# Patient Record
Sex: Male | Born: 1960 | Race: White | Hispanic: No | Smoking: Current every day smoker
Health system: Southern US, Community
[De-identification: ages and names within clinical notes are randomized; demographics above are authoritative.]

## PROBLEM LIST (undated history)

## (undated) DIAGNOSIS — I6529 Occlusion and stenosis of unspecified carotid artery: Secondary | ICD-10-CM

## (undated) DIAGNOSIS — T7840XA Allergy, unspecified, initial encounter: Secondary | ICD-10-CM

## (undated) DIAGNOSIS — R002 Palpitations: Secondary | ICD-10-CM

## (undated) DIAGNOSIS — Z9889 Other specified postprocedural states: Secondary | ICD-10-CM

## (undated) DIAGNOSIS — J3089 Other allergic rhinitis: Secondary | ICD-10-CM

## (undated) DIAGNOSIS — K219 Gastro-esophageal reflux disease without esophagitis: Secondary | ICD-10-CM

## (undated) DIAGNOSIS — I10 Essential (primary) hypertension: Secondary | ICD-10-CM

## (undated) DIAGNOSIS — G4730 Sleep apnea, unspecified: Secondary | ICD-10-CM

## (undated) DIAGNOSIS — M792 Neuralgia and neuritis, unspecified: Secondary | ICD-10-CM

## (undated) DIAGNOSIS — E785 Hyperlipidemia, unspecified: Secondary | ICD-10-CM

## (undated) HISTORY — PX: ROTATOR CUFF REPAIR: SHX139

## (undated) HISTORY — DX: Essential (primary) hypertension: I10

## (undated) HISTORY — DX: Allergy, unspecified, initial encounter: T78.40XA

## (undated) HISTORY — DX: Occlusion and stenosis of unspecified carotid artery: I65.29

## (undated) HISTORY — DX: Hyperlipidemia, unspecified: E78.5

## (undated) HISTORY — PX: OTHER SURGICAL HISTORY: SHX169

---

## 1998-08-06 ENCOUNTER — Ambulatory Visit (HOSPITAL_COMMUNITY): Admission: RE | Admit: 1998-08-06 | Discharge: 1998-08-06 | Payer: Self-pay | Admitting: Neurosurgery

## 1998-08-06 ENCOUNTER — Encounter: Payer: Self-pay | Admitting: Neurosurgery

## 2004-11-19 ENCOUNTER — Ambulatory Visit: Payer: Self-pay | Admitting: Internal Medicine

## 2005-10-28 ENCOUNTER — Ambulatory Visit: Payer: Self-pay | Admitting: Internal Medicine

## 2009-01-30 ENCOUNTER — Ambulatory Visit: Payer: Self-pay | Admitting: Internal Medicine

## 2009-01-30 DIAGNOSIS — R82998 Other abnormal findings in urine: Secondary | ICD-10-CM | POA: Insufficient documentation

## 2009-01-30 DIAGNOSIS — M549 Dorsalgia, unspecified: Secondary | ICD-10-CM | POA: Insufficient documentation

## 2009-01-30 LAB — CONVERTED CEMR LAB
Bilirubin Urine: NEGATIVE
Blood in Urine, dipstick: NEGATIVE
Glucose, Urine, Semiquant: NEGATIVE
Ketones, urine, test strip: NEGATIVE
Nitrite: NEGATIVE
Protein, U semiquant: NEGATIVE
Specific Gravity, Urine: 1.015
Urobilinogen, UA: 0.2
WBC Urine, dipstick: NEGATIVE
pH: 6

## 2009-01-31 ENCOUNTER — Encounter: Payer: Self-pay | Admitting: Internal Medicine

## 2009-02-02 ENCOUNTER — Encounter (INDEPENDENT_AMBULATORY_CARE_PROVIDER_SITE_OTHER): Payer: Self-pay | Admitting: *Deleted

## 2009-02-03 ENCOUNTER — Telehealth (INDEPENDENT_AMBULATORY_CARE_PROVIDER_SITE_OTHER): Payer: Self-pay | Admitting: *Deleted

## 2010-12-23 NOTE — Progress Notes (Signed)
Summary: Lab Results  Phone Note Outgoing Call Call back at Home Phone [phone]   Call placed by: Shonna Chock,  February 03, 2009 8:53 AM Call placed to: Patient Summary of Call: Spoke with patient Re: Urine Culture Excellent no UTI present. Because of symptom , please stay well hydrated, drinking up to 40 oz of H2O daily.  Tommy May  February 03, 2009 8:55 AM

## 2010-12-23 NOTE — Assessment & Plan Note (Signed)
Summary: acute only for urine and back pain--ph   Vital Signs:  Patient profile:   50 year old male Weight:      191.25 pounds Pulse rate:   64 / minute Resp:     15 per minute BP sitting:   130 / 80  Vitals Entered By: Kandice Hams (January 30, 2009 12:57 PM)   History of Present Illness: 10-14 days of mid  back pain w/o injury or trauma. He bends &  lifts up to 40# [place].His pain varies from dull to sharp,up to 7,now a 3. Lasts hours to all day. Rx: Tylenol, Flexeril (from [facility] 2009) or NSAIDS with partial  benefit. Xrays neg in 2008.  Review of Systems General:  Denies chills, fever, sweats, and weight loss. CV:  Denies chest pain or discomfort. Resp:  Denies shortness of breath. GI:  Denies abdominal pain, bloody stools, and dark tarry stools. GU:  Denies discharge, dysuria, and hematuria; ? urine odiferous; no coke colored urine. MS:  Complains of joint pain, mid back pain, and thoracic pain; denies joint redness, joint swelling, low back pain, and muscle weakness; Occa L knee pain. Pain is lower thoracic. Derm:  Denies lesion(s) and rash. Neuro:  Denies numbness, tingling, and weakness; Minor radiation to L axillary line.  Physical Exam  General:  well-nourished,in no acute distress; alert,appropriate and cooperative throughout examination Eyes:  No corneal or conjunctival inflammation noted.No icterus Lungs:  Normal respiratory effort, chest expands symmetrically. Lungs are clear to auscultation, no crackles or wheezes. Heart:  Normal rate and regular rhythm. S1 and S2 normal without gallop, murmur, click, rub or other extra sounds. Abdomen:  Bowel sounds positive,abdomen soft and non-tender without masses, organomegaly or hernias noted. Msk:  Flank tenderness to percussion Extremities:  No clubbing, cyanosis, edema. Neg SLR Neurologic:  alert & oriented X3, strength normal in all extremities, and DTRs symmetrical and normal.   Skin:  Intact without suspicious  lesions or rashes Cervical Nodes:  No lymphadenopathy noted Axillary Nodes:  No palpable lymphadenopathy Psych:  memory intact for recent and remote, normally interactive, and good eye contact.     Impression & Recommendations:  Problem # 1:  BACK PAIN, CHRONIC, INTERMITTENT (ICD-724.5)  Orders: UA Dipstick W/ Micro (manual) ([address]) T-Culture, Urine ([id number]) T-Thoracic Spine 2 Views (91478GN)  His updated medication list for this problem includes:    Cyclobenzaprine Hcl 5 Mg Tabs (Cyclobenzaprine hcl) .Marland Kitchen... 1-2 at bedtime prn  Problem # 2:  OTHER NONSPECIFIC FINDING EXAMINATION OF URINE (ICD-791.9) Odiferous urine, normal urinalysis Orders: T-Culture, Urine ([id number])  Complete Medication List: 1)  Cyclobenzaprine Hcl 5 Mg Tabs (Cyclobenzaprine hcl) .Marland Kitchen.. 1-2 at bedtime prn  Patient Instructions: 1)  Consider Phys Therapy or Chiropractry if no better & tests negative Prescriptions: CYCLOBENZAPRINE HCL 5 MG TABS (CYCLOBENZAPRINE HCL) 1-2 at bedtime prn  #20 x 0   Entered and Authorized by:   Marga Melnick   Signed by:   Marga Melnick on 01/30/2009   Method used:   Print then Give to Patient   RxID:   [id number]   Laboratory Results   Urine Tests    Routine Urinalysis   Color: yellow Appearance: Clear Glucose: negative   (Normal Range: Negative) Bilirubin: negative   (Normal Range: Negative) Ketone: negative   (Normal Range: Negative) Spec. Gravity: 1.015   (Normal Range: 1.003-1.035) Blood: negative   (Normal Range: Negative) pH: 6.0   (Normal Range: 5.0-8.0) Protein: negative   (Normal Range: Negative) Urobilinogen: 0.2   (  Normal Range: 0-1) Nitrite: negative   (Normal Range: Negative) Leukocyte Esterace: negative   (Normal Range: Negative)

## 2010-12-23 NOTE — Letter (Signed)
Summary: Results Follow up Letter  [facility]  [address] [place], [place] [address]   Phone: [phone]  Fax: [phone]    02/02/2009 MRN: [id number]  Tommy May [address] [place], [place]  [address]  Dear [name],  The following are the results of your recent test(s):  Test         Result    Pap Smear:        Normal _____  Not Normal _____ Comments: ______________________________________________________ Cholesterol: LDL(Bad cholesterol):         Your goal is less than:         HDL (Good cholesterol):       Your goal is more than: Comments:  ______________________________________________________ Mammogram:        Normal _____  Not Normal _____ Comments:  ___________________________________________________________________ Hemoccult:        Normal _____  Not normal _______ Comments:    _____________________________________________________________________ Other Tests: PLEASE SEE COPY OF X-RAY REPORT FROM 01/30/09    We routinely do not discuss normal results over the telephone.  If you desire a copy of the results, or you have any questions about this information we can discuss them at your next office visit.   Sincerely,

## 2011-11-21 ENCOUNTER — Encounter: Payer: Self-pay | Admitting: Family Medicine

## 2011-11-21 ENCOUNTER — Ambulatory Visit (INDEPENDENT_AMBULATORY_CARE_PROVIDER_SITE_OTHER): Payer: 59 | Admitting: Family Medicine

## 2011-11-21 DIAGNOSIS — J40 Bronchitis, not specified as acute or chronic: Secondary | ICD-10-CM

## 2011-11-21 DIAGNOSIS — K4090 Unilateral inguinal hernia, without obstruction or gangrene, not specified as recurrent: Secondary | ICD-10-CM | POA: Insufficient documentation

## 2011-11-21 DIAGNOSIS — J329 Chronic sinusitis, unspecified: Secondary | ICD-10-CM

## 2011-11-21 MED ORDER — CLARITHROMYCIN ER 500 MG PO TB24: 1000.0000 mg | ORAL_TABLET | Freq: Every day | ORAL | Status: AC

## 2011-11-21 MED ORDER — BENZONATATE 200 MG PO CAPS: 200.0000 mg | ORAL_CAPSULE | Freq: Three times a day (TID) | ORAL | Status: AC | PRN

## 2011-11-21 NOTE — Progress Notes (Signed)
  Subjective:    Patient ID: Tommy May, male    DOB: 08/25/1961, 50 y.o.   MRN: [id number]  HPI Cough- sxs started 'a few days ago, close to a week'.  Saturday developed 'lump' in L groin area.  Some discomfort.  Has been told by uro that he has small hernia present.  No fevers.  + sick contacts.  Cough is intermittently productive.  No ear pain.  + facial pain- tooth pain.   Review of Systems For ROS see HPI     Objective:   Physical Exam  Vitals reviewed. Constitutional: He appears well-developed and well-nourished. No distress.  HENT:  Head: Normocephalic and atraumatic.  Right Ear: Tympanic membrane normal.  Left Ear: Tympanic membrane normal.  Nose: Mucosal edema and rhinorrhea present. Right sinus exhibits maxillary sinus tenderness and frontal sinus tenderness. Left sinus exhibits maxillary sinus tenderness and frontal sinus tenderness.  Mouth/Throat: Mucous membranes are normal. Oropharyngeal exudate and posterior oropharyngeal erythema present. No posterior oropharyngeal edema.       + PND  Eyes: Conjunctivae and EOM are normal. Pupils are equal, round, and reactive to light.  Neck: Normal range of motion. Neck supple.  Cardiovascular: Normal rate, regular rhythm and normal heart sounds.   Pulmonary/Chest: Effort normal and breath sounds normal. No respiratory distress. He has no wheezes.       + hacking cough  Abdominal: He exhibits mass. There is tenderness (over L suprapubic abdominal wall defect).  Lymphadenopathy:    He has no cervical adenopathy.  Skin: Skin is warm and dry.          Assessment & Plan:

## 2011-11-21 NOTE — Patient Instructions (Signed)
You have a sinus infection/bronchitis Take the Biaxin- 2 tabs at the same time- w/ food Use the cough pills as needed (can combine w/ robitussin) Add Mucinex to thin your congestion You have an inguinal hernia- you do not need to rush and have this repaired.  The only time this becomes an emergency is if it bulges, is painful, and is stuck- then you must go to the ER Ibuprofen for pain relief Hang in there! Happy New Year!

## 2011-11-22 DIAGNOSIS — J40 Bronchitis, not specified as acute or chronic: Secondary | ICD-10-CM | POA: Insufficient documentation

## 2011-11-22 NOTE — Assessment & Plan Note (Signed)
New.  Pt's current sxs consistent w/ infxn.  Start abx.  Cough meds prn.  Encouraged smoking cessation.  Reviewed supportive care and red flags that should prompt return.  Pt expressed understanding and is in agreement w/ plan.

## 2011-11-22 NOTE — Assessment & Plan Note (Signed)
New.  Reviewed dx w/ pt.  Discussed repair options- uro vs general surgery- and the fact that there is no urgent need for surgery.  Reviewed incarceration and strangulation and that these are medical emergencies.  Pt expressed understanding and is in agreement w/ plan.

## 2011-11-22 NOTE — Assessment & Plan Note (Signed)
New.  Pt's sxs and PE consistent w/ infxn.  Start abx.  Reviewed supportive care and red flags that should prompt return.  Pt expressed understanding and is in agreement w/ plan.  

## 2012-10-16 ENCOUNTER — Encounter: Payer: Self-pay | Admitting: Internal Medicine

## 2012-10-16 ENCOUNTER — Ambulatory Visit (INDEPENDENT_AMBULATORY_CARE_PROVIDER_SITE_OTHER): Payer: 59 | Admitting: Internal Medicine

## 2012-10-16 VITALS — BP 160/88 | HR 86 | Temp 98.2°F | Wt 196.4 lb

## 2012-10-16 DIAGNOSIS — J069 Acute upper respiratory infection, unspecified: Secondary | ICD-10-CM

## 2012-10-16 DIAGNOSIS — H6980 Other specified disorders of Eustachian tube, unspecified ear: Secondary | ICD-10-CM

## 2012-10-16 DIAGNOSIS — R030 Elevated blood-pressure reading, without diagnosis of hypertension: Secondary | ICD-10-CM

## 2012-10-16 DIAGNOSIS — J209 Acute bronchitis, unspecified: Secondary | ICD-10-CM

## 2012-10-16 MED ORDER — AMOXICILLIN 500 MG PO CAPS: 500.0000 mg | ORAL_CAPSULE | Freq: Three times a day (TID) | ORAL | Status: DC

## 2012-10-16 MED ORDER — FLUTICASONE PROPIONATE 50 MCG/ACT NA SUSP: 1.0000 | Freq: Two times a day (BID) | NASAL | Status: DC | PRN

## 2012-10-16 NOTE — Progress Notes (Signed)
  Subjective:    Patient ID: Tommy May, male    DOB: 08-04-1961, 51 y.o.   MRN: [id number]  HPI  Symptoms began approximately 2 weeks ago as nasal congestion associated with a loose cough with some purulent sputum. This was followed by chills and some night sweats. He's also had some yellow discharge from his nose as well.  He also describes itchy, watery eyes and sneezing. He has had slight wheezing  He now has pressure in his ears and feels as if his voices reverberating  He has been taking Sudafed up to 2 times a day, cold ease, and Tylenol.    Review of Systems  He denies frontal headache or facial pain. He's had no discharge from the ears     Objective:   Physical Exam General appearance:good health ;well nourished; no acute distress or increased work of breathing is present.  No  lymphadenopathy about the head, neck, or axilla noted.   Eyes: No conjunctival inflammation or lid edema is present.   Ears:  External ear exam shows no significant lesions or deformities.  Otoscopic examination reveals clear canals, tympanic membranes are intact bilaterally without bulging, retraction, inflammation or discharge.  Nose:  External nasal examination shows no deformity or inflammation. Nasal mucosa are pink and moist without lesions or exudates. L septal dislocation;R deviation.No obstruction to airflow.   Oral exam: Dental hygiene is good; lips and gums are healthy appearing.There is mild oropharyngeal erythema ; no  exudate noted.   Neck:  No deformities,  masses, or tenderness noted.      Heart:  Normal rate and regular rhythm. S1 and S2 normal without gallop,  click, rub or other extra sounds. Grade 1/6 systolic murmur   Lungs:Chest clear to auscultation; no wheezes, rhonchi,rales ,or rubs present.No increased work of breathing.    Extremities:  No cyanosis or clubbing  noted . Trace ankle  edema   Skin: Warm & dry           Assessment & Plan:  #1 bronchitis,  acute  #2 upper respiratory infection with eustachian tube dysfunction  #3 elevated blood pressure possibly due to decongestants  Plan: See orders and recommendations

## 2012-10-16 NOTE — Patient Instructions (Addendum)
Plain Mucinex for thick secretions ;force NON dairy fluids . Use a Neti pot daily as needed for sinus congestion; going from open side to congested side . Nasal cleansing in the shower as discussed. Make sure that all residual soap is removed to prevent irritation. Fluticasone 1 spray in each nostril twice a day as needed. Use the "crossover" technique as discussed. Plain Allegra 160 daily as needed for itchy eyes & sneezing. Avoid decongestants as these may have adverse effects including elevation of blood pressure, palpitations, and prostatic dysfunction.    Blood Pressure Goal  Ideally is an AVERAGE < 135/85. This AVERAGE should be calculated from @ least 5-7 BP readings taken @ different times of day on different days of week. You should not respond to isolated BP readings , but rather the AVERAGE for that week

## 2013-03-20 ENCOUNTER — Ambulatory Visit (INDEPENDENT_AMBULATORY_CARE_PROVIDER_SITE_OTHER): Payer: 59 | Admitting: Internal Medicine

## 2013-03-20 ENCOUNTER — Encounter: Payer: Self-pay | Admitting: Internal Medicine

## 2013-03-20 VITALS — BP 138/90 | HR 84 | Temp 98.7°F | Wt 192.0 lb

## 2013-03-20 DIAGNOSIS — M5412 Radiculopathy, cervical region: Secondary | ICD-10-CM

## 2013-03-20 DIAGNOSIS — L720 Epidermal cyst: Secondary | ICD-10-CM

## 2013-03-20 DIAGNOSIS — L723 Sebaceous cyst: Secondary | ICD-10-CM

## 2013-03-20 DIAGNOSIS — M5010 Cervical disc disorder with radiculopathy, unspecified cervical region: Secondary | ICD-10-CM | POA: Insufficient documentation

## 2013-03-20 MED ORDER — GABAPENTIN 100 MG PO CAPS: ORAL_CAPSULE | ORAL | Status: DC

## 2013-03-20 NOTE — Patient Instructions (Addendum)
Use warm moist compresses to 3 times a day to the epidermoid inclusion cyst. Use a cervical memory foam pillow to prevent hyperextension or hyperflexion of the cervical spine. Cervical traction is one option for the radicular symptoms.Assess response to the gabapentin one every 8 hours as needed. If it is partially beneficial, it can be increased up to a total of 3 pills every 8 hours as needed. This increase of 1 pill each dose  should take place over 72 hours at least.

## 2013-03-20 NOTE — Progress Notes (Signed)
  Subjective:    Patient ID: Tommy May, male    DOB: 1961/01/12, 52 y.o.   MRN: [id number]  HPI  He noticed a lump over the forehead one month ago which subsequently had increased in size. There was no trigger or injury for this lesion. He called for an appointment at the insistence of his wife who is a Engineer, civil (consulting); but  since 4/25 the lesion has actually gotten smaller in size without any treatment  Additionally has chronic upper back pain which was diagnosed as a bulging cervical disc in July 2013 by Dr. Farris Has. He's had 2 courses of oral steroids; his taken intermittent Celebrex; and his tramadol. The last seemed to provide the best relief. He's had intermittent tingling in the left fourth and fifth fingers with the neck/trapezius symptoms.    Review of Systems  He has not had significant headaches, blurred vision, double vision, or loss of vision.  He denies fever, chills, sweats, or weight loss.  There's been no associated rash with the left upper extremity symptoms.  He denies incontinence of urine or stool.     Objective:   Physical Exam Gen.: Healthy and well-nourished in appearance. Alert, appropriate and cooperative throughout exam.Appears younger than stated age  Head: Normocephalic without obvious abnormalities. There is a 1.5 x 1.5 subcutaneous structure left of midline in the forehead. This is nontender. There may be a subtle central punctate deficit. The lesion does transilluminate. Eyes: No corneal or conjunctival inflammation noted. Pupils equal round reactive to light and accommodation.  Extraocular motion intact. Vision grossly normal without lenses Neck: No deformities, masses, or tenderness noted. Range of motion decreased. Thyroid normal.                          Musculoskeletal/extremities: No deformity or scoliosis noted of  the thoracic or lumbar spine.  No clubbing, cyanosis, edema, or significant extremity  deformity noted. Tone & strength  Normal. Joints normal   Nail health good. Neurologic: Alert and oriented x3. Deep tendon reflexes symmetrical and normal. No deficit to testing neuromuscular function of the hands present       Skin: Intact without suspicious lesions or rashes. Lymph: No cervical, axillary lymphadenopathy present. Psych: Mood and affect are normal. Normally interactive                                                                                        Assessment & Plan:  #1 subcutaneous lesion left forehead. A history of initial increase in size and subsequent decrease in size without treatment or cause suggest an epidermoid inclusion cyst. A lipoma would be less likely to decrease in size. It is unclear although there is an osteoma or benign hemangioma the knee this lesion but is not attached to the bone.  #2 C8 radiculopathy left upper extremity  Plan: See orders and recommendations.  Warm compresses to the subcutaneous lesion recommended. Were it to increase in size or demonstrate evidence of secondary infection; incision and/or resection could be pursued  Gabapentin would be recommended for radicular symptoms. Traction cervically is one option. Memory foam cervical pillow recommended

## 2013-08-07 ENCOUNTER — Encounter: Payer: Self-pay | Admitting: Internal Medicine

## 2013-08-07 ENCOUNTER — Ambulatory Visit (INDEPENDENT_AMBULATORY_CARE_PROVIDER_SITE_OTHER): Payer: 59 | Admitting: Internal Medicine

## 2013-08-07 VITALS — BP 170/85 | HR 76 | Temp 98.4°F | Wt 196.0 lb

## 2013-08-07 DIAGNOSIS — M5412 Radiculopathy, cervical region: Secondary | ICD-10-CM

## 2013-08-07 DIAGNOSIS — D1809 Hemangioma of other sites: Secondary | ICD-10-CM

## 2013-08-07 MED ORDER — TRAMADOL HCL 50 MG PO TABS: 50.0000 mg | ORAL_TABLET | Freq: Three times a day (TID) | ORAL | Status: DC | PRN

## 2013-08-07 MED ORDER — GABAPENTIN 300 MG PO CAPS: ORAL_CAPSULE | ORAL | Status: DC

## 2013-08-07 NOTE — Progress Notes (Signed)
  Subjective:    Patient ID: Tommy May, male    DOB: 01-02-1961, 52 y.o.   MRN: [id number]  HPI   He has had chronic neck problems dating back to the mid to late 1990s.  There was never a specific injury to the neck. He states the symptoms seem to appear upon awakening   This is been evaluated with MRI on at least 2 occasions. 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  The last MRI  was done by Dr. Farris Has in 6/13 apparently revealed a "bulging disc". Oral steroids and subsequently Celebrex were prescribed. He is not had epidural steroid injections or surgery  The main  neck symptoms are decreased mobility and discomfort in the neck. Rarely he will have burning discomfort in a left C.-8 distribution  Oral steroids provided a temporary benefit. Celebrex has not been of any benefit. He believes that gabapentin at a dose of 300 mg every 8 hours was a benefit. Tramadol as needed was the most beneficial to date.    Review of Systems   He denies fever, chills, sweats, weight loss.  He has no weakness in the extremities.  He also denies incontinence of urine or stool.     Objective:   Physical Exam Gen.: Healthy and well-nourished in appearance. Alert, appropriate and cooperative throughout exam.  Head: Normocephalic .Boss L forehead Eyes: No corneal or conjunctival inflammation noted. No icterus Mouth: Oral mucosa and oropharynx reveal no lesions or exudates. Teeth in good repair. Neck: No deformities, masses, or tenderness noted. Range of motion markedly decreased.                                Musculoskeletal/extremities: No deformity or scoliosis noted of  the thoracic or lumbar spine.  No clubbing, cyanosis, edema, or significant extremity  deformity noted. Tone & strength  Normal. Joints normal . Nail health good. Neurologic: Alert and oriented x3. Deep tendon reflexes symmetrical and normal. No cranial nerve deficit  present.  Skin: Intact without suspicious lesions or rashes. Lymph: No cervical, axillary lymphadenopathy present. Psych: Mood and affect are normal. Normally interactive                                                                                        Assessment & Plan:  See Current Assessment & Plan in Problem List under specific Diagnosis

## 2013-08-07 NOTE — Assessment & Plan Note (Signed)
He has had most reliefwith tramadol. He is open & frank about prior drug use.  Tramadol will be prescribed. Additionally gabapentin 300 mg at bedtime will be recommended as this has been beneficial.  He's been asked to obtain the 6/13 MRI report

## 2013-08-07 NOTE — Patient Instructions (Addendum)
Please sign a release of records to Dr Farris Has for records related to cervical pain & MRI results

## 2013-10-08 ENCOUNTER — Encounter: Payer: Self-pay | Admitting: *Deleted

## 2013-10-08 ENCOUNTER — Telehealth: Payer: Self-pay | Admitting: *Deleted

## 2013-10-08 ENCOUNTER — Other Ambulatory Visit: Payer: Self-pay | Admitting: *Deleted

## 2013-10-08 DIAGNOSIS — M5412 Radiculopathy, cervical region: Secondary | ICD-10-CM

## 2013-10-08 MED ORDER — TRAMADOL HCL 50 MG PO TABS: 50.0000 mg | ORAL_TABLET | Freq: Three times a day (TID) | ORAL | Status: DC | PRN

## 2013-10-08 NOTE — Telephone Encounter (Signed)
Refilled. Pt notified.

## 2013-10-08 NOTE — Telephone Encounter (Signed)
OK X1 

## 2013-10-08 NOTE — Telephone Encounter (Signed)
Tramadol refilled.

## 2013-10-08 NOTE — Telephone Encounter (Signed)
Patient is requesting refill for tramadol.  Last seen-08/07/2013  Last filled-08/07/2013  [facility]-not on file, no contract  Please advise. SW

## 2013-11-07 ENCOUNTER — Telehealth: Payer: Self-pay | Admitting: *Deleted

## 2013-11-07 DIAGNOSIS — M5412 Radiculopathy, cervical region: Secondary | ICD-10-CM

## 2013-11-07 MED ORDER — GABAPENTIN 300 MG PO CAPS: ORAL_CAPSULE | ORAL | Status: DC

## 2013-11-07 NOTE — Telephone Encounter (Signed)
[facility] pharmacy tech called to check on a refill for gabapentin (NEURONTIN) 300 MG capsule

## 2013-11-07 NOTE — Telephone Encounter (Signed)
Rx sent to the pharmacy by e-script.//AB/CMA 

## 2014-02-06 ENCOUNTER — Other Ambulatory Visit: Payer: Self-pay | Admitting: *Deleted

## 2014-02-06 DIAGNOSIS — M5412 Radiculopathy, cervical region: Secondary | ICD-10-CM

## 2014-02-06 MED ORDER — GABAPENTIN 300 MG PO CAPS: ORAL_CAPSULE | ORAL | Status: DC

## 2014-02-17 ENCOUNTER — Telehealth: Payer: Self-pay | Admitting: Internal Medicine

## 2014-02-17 ENCOUNTER — Other Ambulatory Visit: Payer: Self-pay

## 2014-02-17 ENCOUNTER — Ambulatory Visit (INDEPENDENT_AMBULATORY_CARE_PROVIDER_SITE_OTHER): Payer: 59 | Admitting: Physician Assistant

## 2014-02-17 ENCOUNTER — Encounter: Payer: Self-pay | Admitting: Physician Assistant

## 2014-02-17 VITALS — BP 148/96 | HR 77 | Temp 98.1°F | Resp 16 | Ht 70.0 in | Wt 193.0 lb

## 2014-02-17 DIAGNOSIS — H6980 Other specified disorders of Eustachian tube, unspecified ear: Secondary | ICD-10-CM

## 2014-02-17 DIAGNOSIS — H6990 Unspecified Eustachian tube disorder, unspecified ear: Secondary | ICD-10-CM

## 2014-02-17 DIAGNOSIS — R42 Dizziness and giddiness: Secondary | ICD-10-CM | POA: Insufficient documentation

## 2014-02-17 MED ORDER — FLUTICASONE PROPIONATE 50 MCG/ACT NA SUSP: 2.0000 | Freq: Every day | NASAL | Status: DC

## 2014-02-17 MED ORDER — MECLIZINE HCL 25 MG PO TABS: 25.0000 mg | ORAL_TABLET | Freq: Three times a day (TID) | ORAL | Status: DC | PRN

## 2014-02-17 MED ORDER — MECLIZINE HCL 32 MG PO TABS: 32.0000 mg | ORAL_TABLET | Freq: Three times a day (TID) | ORAL | Status: DC | PRN

## 2014-02-17 MED ORDER — DIAZEPAM 5 MG PO TABS: 5.0000 mg | ORAL_TABLET | Freq: Three times a day (TID) | ORAL | Status: DC | PRN

## 2014-02-17 MED ORDER — METHYLPREDNISOLONE ACETATE 80 MG/ML IJ SUSP
40.0000 mg | Freq: Once | INTRAMUSCULAR | Status: AC
  Administered 2014-02-17: 40 mg via INTRAMUSCULAR

## 2014-02-17 NOTE — Progress Notes (Signed)
Patient presents to clinic today c/o 2-3 days of dizziness, ear pressure, "popping" sound in ears and intermittent tinnitus bilaterally.  Denies fever, chills, aches, sinus pressure, sinus pain, nasal congestion, chest congestion, cough, SOB or wheezing.  Endorses good fluid intake.  Denies hx of Meniere disease.  Denies trauma.  Denies hearing loss.  No past medical history on file.  Current Outpatient Prescriptions on File Prior to Visit  Medication Sig Dispense Refill  . esomeprazole (NEXIUM) 10 MG packet Take 40 mg by mouth daily before breakfast.       . gabapentin (NEURONTIN) 300 MG capsule 1 qhs  30 capsule  6  . Loratadine 10 MG CAPS Take by mouth daily.      . traMADol (ULTRAM) 50 MG tablet Take 1 tablet (50 mg total) by mouth every 8 (eight) hours as needed.  30 tablet  0   No current facility-administered medications on file prior to visit.    No Known Allergies  No family history on file.  History   Social History  . Marital Status: Married    Spouse Name: N/A    Number of Children: N/A  . Years of Education: N/A   Social History Main Topics  . Smoking status: Current Every Day Smoker -- 0.50 packs/day for 35 years    Types: Cigarettes  . Smokeless tobacco: None  . Alcohol Use: No  . Drug Use: No  . Sexual Activity: None   Other Topics Concern  . None   Social History Narrative  . None   Review of Systems - See HPI.  All other ROS are negative.  BP 148/96  Pulse 77  Temp(Src) 98.1 F (36.7 C) (Oral)  Resp 16  Ht 5\' 10"  (1.778 m)  Wt 193 lb (87.544 kg)  BMI 27.69 kg/m2  SpO2 100%  Physical Exam  Vitals reviewed. Constitutional: He is oriented to person, place, and time and well-developed, well-nourished, and in no distress.  HENT:  Head: Normocephalic and atraumatic.  Right Ear: Hearing, external ear and ear canal normal.  Left Ear: Hearing, external ear and ear canal normal.  Nose: Nose normal. Right sinus exhibits no maxillary sinus tenderness  and no frontal sinus tenderness. Left sinus exhibits no maxillary sinus tenderness and no frontal sinus tenderness.  Mouth/Throat: Uvula is midline, oropharynx is clear and moist and mucous membranes are normal.  TM without erythema or bulging. Fluid noted behind TM bilaterally.  Eyes: Conjunctivae and EOM are normal. Pupils are equal, round, and reactive to light.  Negative for nystagmus  Neck: Neck supple.  Cardiovascular: Normal rate, regular rhythm, normal heart sounds and intact distal pulses.   Pulmonary/Chest: Effort normal and breath sounds normal. No respiratory distress. He has no wheezes. He has no rales. He exhibits no tenderness.  Lymphadenopathy:    He has no cervical adenopathy.  Neurological: He is alert and oriented to person, place, and time.  Skin: Skin is warm and dry. No rash noted.  Psychiatric: Affect normal.   Assessment/Plan: ETD (eustachian tube dysfunction) Rx Flonase.  Daily zyrtec.  Vertigo Dix-Hallpike negative.  Possible viral etiology.  Patient also with ETD. Depo IM given.  Rx Diazepam Q8H prn for vertigo. Rx Flonase and daily zyrtec for ETD and fluid.  Call or return to clinic if symptoms are not improving.

## 2014-02-17 NOTE — Telephone Encounter (Signed)
Patient wife called and stated that [name] prescribe diazepam (VALIUM) 5 MG tablet for her husband. Patient wife is wondering if he can take meclizine instead because he has a medical history of substance abuse a while back. Patient wife states she just wants Tommy May's opinion if he thinks its okay to switch it and if not if then they will just work with the Valium. Please advise.

## 2014-02-17 NOTE — Telephone Encounter (Signed)
Rx change request made & pt informed, understood new Rx to pharmacy/SLS

## 2014-02-17 NOTE — Progress Notes (Signed)
Ok for 25 mg Antivert instead of 32 mg per [place].

## 2014-02-17 NOTE — Progress Notes (Signed)
Pre visit review using our clinic review tool, if applicable. No additional management support is needed unless otherwise documented below in the visit note/SLS  

## 2014-02-17 NOTE — Assessment & Plan Note (Signed)
Rx Flonase.  Daily zyrtec.

## 2014-02-17 NOTE — Assessment & Plan Note (Signed)
Dix-Hallpike negative.  Possible viral etiology.  Patient also with ETD. Depo IM given.  Rx Diazepam Q8H prn for vertigo. Rx Flonase and daily zyrtec for ETD and fluid.  Call or return to clinic if symptoms are not improving.

## 2014-02-17 NOTE — Patient Instructions (Signed)
Please use Flonase daily as directed.  Stay well-hydrated but limit salt intake.  Take a daily Zyrtec.  Take Diazepam as directed for dizziness.  Symptoms should improve on their own.  If symptoms are persisting, we will need to set you up to see an ENT physician.  Vertigo Vertigo means you feel like you are moving when you are not. Vertigo can make you feel like things around you are moving when they are not. This problem often goes away on its own.  HOME CARE   Follow your doctor's instructions.  Avoid driving.  Avoid using heavy machinery.  Avoid doing any activity that could be dangerous if you have a vertigo attack.  Tell your doctor if a medicine seems to cause your vertigo. GET HELP RIGHT AWAY IF:   Your medicines do not help or make you feel worse.  You have trouble talking or walking.  You feel weak or have trouble using your arms, hands, or legs.  You have bad headaches.  You keep feeling sick to your stomach (nauseous) or throwing up (vomiting).  Your vision changes.  A family member notices changes in your behavior.  Your problems get worse. MAKE SURE YOU:  Understand these instructions.  Will watch your condition.  Will get help right away if you are not doing well or get worse. Document Released: 08/16/2008 Document Revised: 01/30/2012 Document Reviewed: 05/26/2011 [facility] Patient Information 2014 [place].

## 2014-02-18 ENCOUNTER — Telehealth: Payer: Self-pay | Admitting: Internal Medicine

## 2014-02-18 NOTE — Telephone Encounter (Signed)
Relevant patient education assigned to patient using Emmi. ° °

## 2014-04-16 ENCOUNTER — Ambulatory Visit (INDEPENDENT_AMBULATORY_CARE_PROVIDER_SITE_OTHER): Payer: 59 | Admitting: Internal Medicine

## 2014-04-16 ENCOUNTER — Encounter: Payer: Self-pay | Admitting: Internal Medicine

## 2014-04-16 ENCOUNTER — Other Ambulatory Visit (INDEPENDENT_AMBULATORY_CARE_PROVIDER_SITE_OTHER): Payer: 59

## 2014-04-16 VITALS — BP 164/98 | HR 85 | Temp 98.3°F | Wt 188.6 lb

## 2014-04-16 DIAGNOSIS — Z9189 Other specified personal risk factors, not elsewhere classified: Secondary | ICD-10-CM

## 2014-04-16 DIAGNOSIS — R5381 Other malaise: Secondary | ICD-10-CM

## 2014-04-16 DIAGNOSIS — R5383 Other fatigue: Secondary | ICD-10-CM

## 2014-04-16 DIAGNOSIS — F17210 Nicotine dependence, cigarettes, uncomplicated: Secondary | ICD-10-CM

## 2014-04-16 DIAGNOSIS — Z87898 Personal history of other specified conditions: Secondary | ICD-10-CM

## 2014-04-16 DIAGNOSIS — M5412 Radiculopathy, cervical region: Secondary | ICD-10-CM

## 2014-04-16 DIAGNOSIS — F17200 Nicotine dependence, unspecified, uncomplicated: Secondary | ICD-10-CM

## 2014-04-16 DIAGNOSIS — R030 Elevated blood-pressure reading, without diagnosis of hypertension: Secondary | ICD-10-CM

## 2014-04-16 DIAGNOSIS — R002 Palpitations: Secondary | ICD-10-CM

## 2014-04-16 LAB — CBC WITH DIFFERENTIAL/PLATELET
Basophils Absolute: 0 10*3/uL (ref 0.0–0.1)
Basophils Relative: 0.3 % (ref 0.0–3.0)
Eosinophils Absolute: 0.1 10*3/uL (ref 0.0–0.7)
Eosinophils Relative: 1.2 % (ref 0.0–5.0)
HCT: 49.8 % (ref 39.0–52.0)
Hemoglobin: 17.4 g/dL — ABNORMAL HIGH (ref 13.0–17.0)
Lymphocytes Relative: 40.2 % (ref 12.0–46.0)
Lymphs Abs: 3.4 10*3/uL (ref 0.7–4.0)
MCHC: 34.9 g/dL (ref 30.0–36.0)
MCV: 88.7 fl (ref 78.0–100.0)
Monocytes Absolute: 0.8 10*3/uL (ref 0.1–1.0)
Monocytes Relative: 9.2 % (ref 3.0–12.0)
Neutro Abs: 4.1 10*3/uL (ref 1.4–7.7)
Neutrophils Relative %: 49.1 % (ref 43.0–77.0)
Platelets: 193 10*3/uL (ref 150.0–400.0)
RBC: 5.62 Mil/uL (ref 4.22–5.81)
RDW: 12.6 % (ref 11.5–15.5)
WBC: 8.4 10*3/uL (ref 4.0–10.5)

## 2014-04-16 LAB — BASIC METABOLIC PANEL
BUN: 8 mg/dL (ref 6–23)
CO2: 27 mEq/L (ref 19–32)
Calcium: 9.2 mg/dL (ref 8.4–10.5)
Chloride: 102 mEq/L (ref 96–112)
Creatinine, Ser: 0.8 mg/dL (ref 0.4–1.5)
GFR: 105.11 mL/min (ref >60.00)
Glucose, Bld: 106 mg/dL — ABNORMAL HIGH (ref 70–99)
Potassium: 4.5 mEq/L (ref 3.5–5.1)
Sodium: 137 mEq/L (ref 135–145)

## 2014-04-16 LAB — MAGNESIUM: Magnesium: 2 mg/dL (ref 1.5–2.5)

## 2014-04-16 LAB — TSH: TSH: 2.94 u[IU]/mL (ref 0.35–4.50)

## 2014-04-16 LAB — T4, FREE: Free T4: 0.9 ng/dL (ref 0.60–1.60)

## 2014-04-16 MED ORDER — TRAMADOL HCL 50 MG PO TABS: 50.0000 mg | ORAL_TABLET | Freq: Three times a day (TID) | ORAL | Status: DC | PRN

## 2014-04-16 MED ORDER — METOPROLOL TARTRATE 25 MG PO TABS: 25.0000 mg | ORAL_TABLET | Freq: Two times a day (BID) | ORAL | Status: DC

## 2014-04-16 NOTE — Progress Notes (Signed)
Subjective:    Patient ID: Tommy May, male    DOB: Jul 02, 1961, 53 y.o.   MRN: [id number]  HPI He actually has 2 concerns  He's had fatigue for 6 -12 months. This is in the context of some altered sleep. He has some difficulty going to sleep but gabapentin Neurontin melatonin have been of some benefit. He has chronic issues with staying asleep.  His wife who is a nurse is concerned about sleep apnea as he has excess snoring.  The fatigue is constant but worse with exertion such as yard work  He also describes  "fluttering" of his heart since late April. This typically has occurred 1-2 times per week. Yesterday it lasted all day. He's had 5-6 episodes today lasting seconds. This is not worse with exertion although his fatigue is. This is sensed in the substernal area. He also has a dull ache in abdomen. The dull ache does accompany the fluttering, also lasting seconds  He also describes slight dizziness which he feels may be related to the change in heart rhythm or rate. He was seen for "vertigo" 02/17/14.  There is no specific chest pain per se. He is unaware of any factors that exacerbate his symptoms such as activity.  He continues to smoke, "< 1 ppd".  His father did have a stroke.  He was unaware of his blood pressure elevation.  Review of Systems   He does not have any pleuritic component. He has no cough or sputum production  He specifically denies dyspepsia, dysphagia, unexplained weight loss, melena, or rectal bleeding There is no pain from the back radiating anteriorly  He also denies ankle edema, paroxysmal nocturnal dyspnea, or claudication.  He's had no syncope despite the history of fluttering & slight dizziness.        Objective:   Physical Exam  Pertinent positive findings included a small exostosis of forehead  He has decreased range of motion of the  cervical spine.  He exhibits a  S4 without murmur or gallop. Repeat blood pressure was 154/82  The  breath sounds are decreased..  Minor crepitus of left greater than the right knee without associated effusion  Gen.:  well-nourished in appearance. Alert and cooperative throughout exam. Appears younger than stated age  Head: Normocephalic without obvious abnormalities; no alopecia  Eyes: No corneal or conjunctival inflammation noted. Pupils equal round reactive to light and accommodation. Extraocular motion intact. No nystagmus. Ears: External  ear exam reveals no significant lesions or deformities.  Nose: External nasal exam reveals no deformity or inflammation. Nasal mucosa are pink and moist. No lesions or exudates noted.   Mouth: Oral mucosa and oropharynx reveal no lesions or exudates. Teeth in good repair. Neck: No deformities, masses, or tenderness noted. Thyroid normal. Lungs: Normal respiratory effort; chest expands symmetrically. Lungs are clear to auscultation without rales, wheezes, or increased work of breathing. Heart: Normal rate and rhythm. Normal S1 and S2. No  click, or rub.  Abdomen: Bowel sounds normal; abdomen soft and nontender. No masses, organomegaly or hernias noted.                           Musculoskeletal/extremities: No deformity or scoliosis noted of  the thoracic or lumbar spine.  No clubbing, cyanosis, edema, or significant extremity  deformity noted. Range of motion normal .Tone & strength normal. Hand joints normal  Fingernail health good. Able to lie down & sit up w/o help. Negative SLR  bilaterally Vascular: Carotid, radial artery, dorsalis pedis and  posterior tibial pulses are full and equal. No bruits present. Neurologic: Alert and oriented x3. Deep tendon reflexes symmetrical and normal.  Gait normal .      Skin: Intact without suspicious lesions or rashes. Lymph: No cervical, axillary lymphadenopathy present. Psych: Mood and affect suggests la belle indifference when smoking risks discussed.                                                                                        Assessment & Plan:  #1 fatigue, worse with exertion  #2 palpitations on average one to 2 times per week  #3 elevated blood pressure without prior diagnosis of hypertension #4 snoring Plan: See orders and recommendations.

## 2014-04-16 NOTE — Progress Notes (Signed)
Pre visit review using our clinic review tool, if applicable. No additional management support is needed unless otherwise documented below in the visit note. 

## 2014-04-16 NOTE — Patient Instructions (Addendum)
Your next office appointment will be determined based upon review of your pending labs . Those instructions will be transmitted to you through My Chart  OR  by mail   Please report any significant change in your symptoms.To prevent palpitations or premature beats, avoid stimulants such as decongestants, diet pills, nicotine, or caffeine (coffee, tea, cola, or chocolate) to excess.  Minimal Blood Pressure Goal= AVERAGE < 140/90;  Ideal is an AVERAGE < 135/85. This AVERAGE should be calculated from @ least 5-7 BP readings taken @ different times of day on different days of week. You should not respond to isolated BP readings , but rather the AVERAGE for that week .Please bring your  blood pressure cuff to office visits to verify that it is reliable.It  can also be checked against the blood pressure device at the pharmacy. Finger or wrist cuffs are not dependable; an arm cuff is. Fill the  prescription for the BP medication if BP NOT @ goal based on  7 to 14 day average.  To prevent sleep dysfunction follow these instructions for sleep hygiene. Do not read, watch TV, or eat in bed. Do not get into bed until you are ready to turn off the light &  to go to sleep. Do not ingest stimulants ( decongestants, diet pills, nicotine, caffeine) after the evening meal.Do not take daytime naps.Cardiovascular exercise, this can be as simple a program as walking, is recommended 30-45 minutes 3-4 times per week. If you're not exercising you should take 6-8 weeks to build up to this level. Please think about quitting smoking. Review the risks we discussed. Please call 1-800-QUIT-NOW ([phone]) for free smoking cessation counseling.

## 2014-04-19 ENCOUNTER — Other Ambulatory Visit: Payer: Self-pay | Admitting: Internal Medicine

## 2014-04-19 DIAGNOSIS — R7309 Other abnormal glucose: Secondary | ICD-10-CM

## 2014-04-21 ENCOUNTER — Telehealth: Payer: Self-pay

## 2014-04-21 ENCOUNTER — Encounter: Payer: Self-pay | Admitting: Pulmonary Disease

## 2014-04-21 ENCOUNTER — Ambulatory Visit: Payer: 59

## 2014-04-21 ENCOUNTER — Ambulatory Visit (HOSPITAL_BASED_OUTPATIENT_CLINIC_OR_DEPARTMENT_OTHER): Payer: 59

## 2014-04-21 ENCOUNTER — Ambulatory Visit (INDEPENDENT_AMBULATORY_CARE_PROVIDER_SITE_OTHER): Payer: 59 | Admitting: Pulmonary Disease

## 2014-04-21 VITALS — BP 142/58 | HR 72 | Temp 98.4°F | Ht 70.0 in | Wt 191.8 lb

## 2014-04-21 DIAGNOSIS — R7309 Other abnormal glucose: Secondary | ICD-10-CM

## 2014-04-21 DIAGNOSIS — D751 Secondary polycythemia: Secondary | ICD-10-CM

## 2014-04-21 DIAGNOSIS — R002 Palpitations: Secondary | ICD-10-CM

## 2014-04-21 DIAGNOSIS — G4733 Obstructive sleep apnea (adult) (pediatric): Secondary | ICD-10-CM

## 2014-04-21 LAB — HEMOGLOBIN A1C: Hgb A1c MFr Bld: 5.7 % (ref 4.6–6.5)

## 2014-04-21 NOTE — Telephone Encounter (Signed)
Message copied by Shelly Coss on Mon Apr 21, 2014  8:46 AM ------      Message from: Hendricks Limes      Created: Sat Apr 19, 2014  9:38 AM       Please add A1c (790.29)       ------

## 2014-04-21 NOTE — Progress Notes (Signed)
Subjective:    Patient ID: Tommy May, male    DOB: Apr 01, 1961, 53 y.o.   MRN: [id number]  HPI  Chief Complaint  Patient presents with  . Sleep Consult    C/o stops breathing at night, feels tired during the day and wakes up about 4-6 times at night. Mostly d/t going to bathroom.    53 year old Smoker presents for evaluation of sleep disordered breathing. His wife is a Armed forces operational officer, has witnessed apneas, loud snoring has gotten worse in recent months. This is worse when he sleeps on his back. He also reports chronic fatigue for at least the last 6-12 months. He reported intermittent palpitations almost daily, recent medical evaluation showed hypertension and hemoglobin level of 17. He also reports difficulty with sleep maintenance and has been using his son's melatonin with some benefit. Epworth sleepiness score is 10 He takes a daily nap in bed for about an hour after returning from work around 3 PM   Bedtime is around 10 PM, sleep latency is minimal, he sleeps on his side of his back with one pillow, reports 4-6 nocturnal awakenings for nocturia and is out of bed by 4:30 AM feeling tired with occasional dryness of mouth. On weekends he stays in bed at 7:30 AM but still naps. He is gained about 10 pounds in the last one year. He drinks 4-5 cups of coffee daily and 2 caffeinated beverages. He smokes about three fourths packs per day-about 30-pack-years There is no history suggestive of cataplexy, sleep paralysis or parasomnias   Past Medical History  Diagnosis Date  . Hypertension     Past Surgical History  Procedure Laterality Date  . Rotator cuff repair      No Known Allergies  History   Social History  . Marital Status: Married    Spouse Name: N/A    Number of Children: 2  . Years of Education: N/A   Occupational History  . purchasing    Social History Main Topics  . Smoking status: Current Every Day Smoker -- 0.50 packs/day for 43 years    Types:  Cigarettes  . Smokeless tobacco: Not on file  . Alcohol Use: No  . Drug Use: No  . Sexual Activity: Not on file   Other Topics Concern  . Not on file   Social History Narrative  . No narrative on file    Family History  Problem Relation Age of Onset  . Stroke Father 56  . Diabetes Father   . Diabetes Paternal Grandmother   . Stroke Paternal Grandfather 30    guess early 79's     Review of Systems  Constitutional: Negative for fever and unexpected weight change.  HENT: Negative for congestion, dental problem, ear pain, nosebleeds, postnasal drip, rhinorrhea, sinus pressure, sneezing, sore throat and trouble swallowing.   Eyes: Negative for redness and itching.  Respiratory: Positive for cough. Negative for chest tightness, shortness of breath and wheezing.   Cardiovascular: Positive for palpitations. Negative for leg swelling.  Gastrointestinal: Negative for nausea and vomiting.  Genitourinary: Negative for dysuria.  Musculoskeletal: Negative for joint swelling.  Skin: Negative for rash.  Neurological: Negative for headaches.  Hematological: Does not bruise/bleed easily.  Psychiatric/Behavioral: Negative for dysphoric mood. The patient is not nervous/anxious.        Objective:   Physical Exam  Gen. Pleasant, well-nourished, in no distress, normal affect ENT - no lesions, no post nasal drip Neck: No JVD, no thyromegaly, no carotid bruits Lungs:  no use of accessory muscles, no dullness to percussion, clear without rales or rhonchi  Cardiovascular: Rhythm regular, heart sounds  normal, no murmurs or gallops, no peripheral edema Abdomen: soft and non-tender, no hepatosplenomegaly, BS normal. Musculoskeletal: No deformities, no cyanosis or clubbing Neuro:  alert, non focal        Assessment & Plan:

## 2014-04-21 NOTE — Assessment & Plan Note (Signed)
Given excessive daytime somnolence, narrow pharyngeal exam, witnessed apneas & loud snoring, obstructive sleep apnea is very likely & an overnight polysomnogram will be scheduled as a split study. The pathophysiology of obstructive sleep apnea , it's cardiovascular consequences & modes of treatment including CPAP were discused with the patient in detail & they evidenced understanding.  

## 2014-04-21 NOTE — Assessment & Plan Note (Signed)
Holter - extra systoles , doubt AF Decrease caffeine intake

## 2014-04-21 NOTE — Assessment & Plan Note (Signed)
  Your high red cell count is related to smoking - You have to [place] !!

## 2014-04-21 NOTE — Patient Instructions (Signed)
Cardiac monitor (holter) Sleep study Your high red cell count is related to smoking - You have to [place] !!

## 2014-04-21 NOTE — Telephone Encounter (Signed)
Request has been faxed.

## 2014-04-24 ENCOUNTER — Encounter: Payer: Self-pay | Admitting: *Deleted

## 2014-04-24 ENCOUNTER — Encounter (INDEPENDENT_AMBULATORY_CARE_PROVIDER_SITE_OTHER): Payer: 59

## 2014-04-24 DIAGNOSIS — R002 Palpitations: Secondary | ICD-10-CM

## 2014-04-24 NOTE — Progress Notes (Signed)
Patient ID: Tommy May, male   DOB: 1961/10/02, 53 y.o.   MRN: [id number] ARIA 48 hour holter monitor applied to patient.

## 2014-04-28 ENCOUNTER — Other Ambulatory Visit: Payer: Self-pay | Admitting: Pulmonary Disease

## 2014-04-28 DIAGNOSIS — G4733 Obstructive sleep apnea (adult) (pediatric): Secondary | ICD-10-CM

## 2014-05-01 ENCOUNTER — Other Ambulatory Visit: Payer: Self-pay

## 2014-05-01 DIAGNOSIS — M5412 Radiculopathy, cervical region: Secondary | ICD-10-CM

## 2014-05-01 MED ORDER — GABAPENTIN 300 MG PO CAPS: ORAL_CAPSULE | ORAL | Status: DC

## 2014-05-13 ENCOUNTER — Other Ambulatory Visit: Payer: Self-pay | Admitting: Internal Medicine

## 2014-05-13 DIAGNOSIS — M5412 Radiculopathy, cervical region: Secondary | ICD-10-CM

## 2014-05-13 MED ORDER — TRAMADOL HCL 50 MG PO TABS: 50.0000 mg | ORAL_TABLET | Freq: Three times a day (TID) | ORAL | Status: DC | PRN

## 2014-06-01 ENCOUNTER — Encounter (HOSPITAL_BASED_OUTPATIENT_CLINIC_OR_DEPARTMENT_OTHER): Payer: 59

## 2014-06-05 DIAGNOSIS — G4710 Hypersomnia, unspecified: Secondary | ICD-10-CM

## 2014-06-05 DIAGNOSIS — G4730 Sleep apnea, unspecified: Secondary | ICD-10-CM

## 2014-06-10 ENCOUNTER — Other Ambulatory Visit: Payer: Self-pay | Admitting: Internal Medicine

## 2014-06-10 DIAGNOSIS — M5412 Radiculopathy, cervical region: Secondary | ICD-10-CM

## 2014-06-10 MED ORDER — TRAMADOL HCL 50 MG PO TABS: 50.0000 mg | ORAL_TABLET | Freq: Three times a day (TID) | ORAL | Status: DC | PRN

## 2014-06-11 ENCOUNTER — Encounter: Payer: Self-pay | Admitting: Pulmonary Disease

## 2014-06-11 ENCOUNTER — Ambulatory Visit (INDEPENDENT_AMBULATORY_CARE_PROVIDER_SITE_OTHER): Payer: 59 | Admitting: Pulmonary Disease

## 2014-06-11 VITALS — BP 140/88 | HR 78 | Ht 70.0 in | Wt 191.6 lb

## 2014-06-11 DIAGNOSIS — R002 Palpitations: Secondary | ICD-10-CM

## 2014-06-11 DIAGNOSIS — D751 Secondary polycythemia: Secondary | ICD-10-CM

## 2014-06-11 DIAGNOSIS — G4733 Obstructive sleep apnea (adult) (pediatric): Secondary | ICD-10-CM

## 2014-06-11 NOTE — Patient Instructions (Signed)
You have severe obstructive sleep apnea - stopped breathing 35 times/hour CPAP titration study

## 2014-06-11 NOTE — Progress Notes (Signed)
   Subjective:    Patient ID: SMOKEY MELOTT, male    DOB: 02/23/1961, 53 y.o.   MRN: [id number]  HPI  53 year old Smoker for FU of OSA. His wife is a Armed forces operational officer, has witnessed apneas, loud snoring has gotten worse in recent months. This is worse when he sleeps on his back. He also reports chronic fatigue for at least the last 6-12 months. He reported intermittent palpitations almost daily, recent medical evaluation showed hypertension and hemoglobin level of 17.  He also reports difficulty with sleep maintenance and has been using his son's melatonin with some benefit.  He drinks 4-5 cups of coffee daily and 2 caffeinated beverages. He smokes about three fourths packs per day-about 30-pack-years  06/11/14 Chief Complaint  Patient presents with  . Follow-up    Discuss home sleep study results and 48 monitor result   Remains fatigued, c/o non refreshing sleep Continues to smoke, has not cut down caffeine intake HOlter showed freq apcs & PVCs Home sleep study showed AHI 34/h  Review of Systems neg for any significant sore throat, dysphagia, itching, sneezing, nasal congestion or excess/ purulent secretions, fever, chills, sweats, unintended wt loss, pleuritic or exertional cp, hempoptysis, orthopnea pnd or change in chronic leg swelling. Also denies presyncope, palpitations, heartburn, abdominal pain, nausea, vomiting, diarrhea or change in bowel or urinary habits, dysuria,hematuria, rash, arthralgias, visual complaints, headache, numbness weakness or ataxia.     Objective:   Physical Exam  Gen. Pleasant, well built, in no distress ENT - no lesions, no post nasal drip, class 2 airway Neck: No JVD, no thyromegaly, no carotid bruits Lungs: no use of accessory muscles, no dullness to percussion, decreased without rales or rhonchi  Cardiovascular: Rhythm regular, heart sounds  normal, no murmurs or gallops, no peripheral edema Musculoskeletal: No deformities, no cyanosis or  clubbing , no tremors       Assessment & Plan:

## 2014-06-11 NOTE — Assessment & Plan Note (Signed)
Decrease caffeine If persistent  After CPAP, cards referral

## 2014-06-11 NOTE — Assessment & Plan Note (Addendum)
CPAP titration study, in v/o severe degree & desatn, also given h/o palpitations. This will also provide mask desensitization  Weight loss encouraged, compliance with goal of at least 4-6 hrs every night is the expectation. Advised against medications with sedative side effects Cautioned against driving when sleepy - understanding that sleepiness will vary on a day to day basis

## 2014-06-11 NOTE — Assessment & Plan Note (Signed)
Smoking cessation - he will try gum Discussed chantix

## 2014-06-12 ENCOUNTER — Telehealth: Payer: Self-pay | Admitting: Pulmonary Disease

## 2014-06-12 NOTE — Telephone Encounter (Signed)
[facility] final Joellen Jersey

## 2014-06-12 NOTE — Telephone Encounter (Signed)
I spoke with [facility] & she is aware of the CPAP Titration being done 06/17/14.  She is working on pre-auth with [employer].  I called & spoke with the patient & advised that the person who does our pre-certs is aware of the study needing pre-cert with [facility] & she is working on this. He voiced understanding of this Tommy May

## 2014-06-12 NOTE — Telephone Encounter (Signed)
Please advise PCC;s thanks 

## 2014-06-13 NOTE — Telephone Encounter (Signed)
Faxed copy of HST to [email or web address]  and this will determine of cpap titration will be authorized Joellen Jersey

## 2014-06-17 ENCOUNTER — Ambulatory Visit (HOSPITAL_BASED_OUTPATIENT_CLINIC_OR_DEPARTMENT_OTHER): Payer: 59 | Attending: Pulmonary Disease

## 2014-06-17 VITALS — Ht 70.0 in | Wt 195.0 lb

## 2014-06-17 DIAGNOSIS — G4761 Periodic limb movement disorder: Secondary | ICD-10-CM | POA: Diagnosis not present

## 2014-06-17 DIAGNOSIS — R0609 Other forms of dyspnea: Secondary | ICD-10-CM | POA: Diagnosis not present

## 2014-06-17 DIAGNOSIS — G4733 Obstructive sleep apnea (adult) (pediatric): Secondary | ICD-10-CM | POA: Diagnosis not present

## 2014-06-17 DIAGNOSIS — Z9989 Dependence on other enabling machines and devices: Secondary | ICD-10-CM

## 2014-06-17 DIAGNOSIS — R0989 Other specified symptoms and signs involving the circulatory and respiratory systems: Secondary | ICD-10-CM | POA: Diagnosis not present

## 2014-06-17 NOTE — Telephone Encounter (Signed)
OFVW#[id number] pt and [email or web address]  center aware Tommy May

## 2014-06-18 ENCOUNTER — Telehealth: Payer: Self-pay | Admitting: Pulmonary Disease

## 2014-06-18 DIAGNOSIS — G4730 Sleep apnea, unspecified: Secondary | ICD-10-CM

## 2014-06-18 DIAGNOSIS — G4710 Hypersomnia, unspecified: Secondary | ICD-10-CM

## 2014-06-18 DIAGNOSIS — G4733 Obstructive sleep apnea (adult) (pediatric): Secondary | ICD-10-CM

## 2014-06-18 NOTE — Telephone Encounter (Signed)
Pt returned call, Please call [phone]

## 2014-06-18 NOTE — Telephone Encounter (Signed)
I spoke with patient about results and he verbalized understanding and had no questions Order placed. Please advise PCC's thanks

## 2014-06-18 NOTE — Telephone Encounter (Signed)
CPAP can be initiated at 10 centimeters with a medium nasal mask and compliance monitored at this level.Rx sent to DME. Pl arrange FU in 6 wks

## 2014-06-18 NOTE — Sleep Study (Addendum)
[place]  NAME: Tommy May  DATE OF BIRTH: [birth year]  MEDICAL RECORD NUMBER [id number]  LOCATION: [facility]  PHYSICIAN: Carmie Lanpher V.  DATE OF STUDY: 06/17/14   SLEEP STUDY TYPE: CPAP titration study               REFERRING PHYSICIAN: Rigoberto Noel, MD  INDICATION FOR STUDY: 53 year old smoker with home study showing AHI of 34 events per hour consistent with severe OSA. At the time of this study ,they weighed 28 195 pounds with a height of  5 ft 10 inches and the BMI of 28, neck size of 16 inches. Epworth sleepiness score was 10   This CPAP titration polysomnogram was performed with a sleep technologist in attendance. EEG, EOG,EMG and respiratory parameters recorded. Sleep stages, arousals, limb movements and respiratory data was scored according to criteria laid out by the American Academy of sleep medicine.  SLEEP ARCHITECTURE: Lights out was at 2237 PM and lights on was at 459 AM. Total sleep time was 319 minutes with sleep period time of 370 minutes and sleep efficiency of 83% .Sleep latency was 12 minutes with latency to REM sleep of 50 minutes and wake after sleep onset of 51 minutes.  Sleep stages as a percentage of total sleep time was N1 -12 %,N2- 65 % and REM sleep 23 % ( 74 minutes) . The longest period of REM sleep was around 12 AM.   AROUSAL DATA : There were 80 arousals with an arousal index of 15 events per hour. Of these 43 were spontaneous, and 37 were associated with respiratory events and 0 were associated periodic limb movements  RESPIRATORY DATA: CPAP was initiated at 4 centimeters and titrated to a final level of 12 centimeters due to respiratory events and snoring. At the final level of 12 centimeters, there were 0 obstructive apneas, 3 central apneas, 0 mixed apneas and 1 hypopneas with apnea -hypopnea index of 4 events per hour. Central apneas seem to emerge beyond 10 cm. At 10 cm for 65 minutes for hypoxia as were  noted with an AHI of 3.7 per hour .There was no relation to sleep stage or body position. Titration was optimal.  MOVEMENT/PARASOMNIA: There were 0 PLMS with a PLM index of 0 events per hour. The PLM arousal index was 0 events per hour.  OXYGEN DATA: The lowest desaturation was 89 % during non-REM sleep and the desaturation index was 14 per hour. The saturations stayed below 88% for 0 minutes.  CARDIAC DATA: The low heart rate was 35 beats per minute. The high heart rate recorded was an artifact. No arrhythmias were noted   IMPRESSION :  1. severe obstructive sleep apnea with hypopneas causing sleep fragmentation and mild oxygen desaturation. 2. This was corrected by CPAP of 10 centimeters with a medium nasal mask. Titration was optimal with supine sleep noted. 3. No evidence of cardiac arrhythmias or behavioral disturbance during sleep. 4. Periodic limb movements were not noted.  RECOMMENDATION:    1. The treatment options for this degree of sleep disordered breathing includes weight loss and CPAP therapy. CPAP can be initiated at 10 centimeters with a medium nasal mask and compliance monitored at this level. 2. Patient should be cautioned against driving when sleepy 3. They should be asked to avoid medications with sedative side effects  Rigoberto Noel  MD Diplomate, American Board of Sleep Medicine  ELECTRONICALLY SIGNED ON:  06/18/2014  [place]  PH: [phone]   FX: [phone] [place]

## 2014-06-18 NOTE — Telephone Encounter (Signed)
lmomtcb x1 

## 2014-06-19 NOTE — Telephone Encounter (Signed)
Order sent to [facility] for cpap set up Tommy May

## 2014-06-20 DIAGNOSIS — G4733 Obstructive sleep apnea (adult) (pediatric): Secondary | ICD-10-CM

## 2014-06-30 ENCOUNTER — Encounter: Payer: Self-pay | Admitting: Pulmonary Disease

## 2014-07-14 ENCOUNTER — Encounter: Payer: Self-pay | Admitting: Internal Medicine

## 2014-07-16 ENCOUNTER — Encounter: Payer: Self-pay | Admitting: Internal Medicine

## 2014-07-16 ENCOUNTER — Ambulatory Visit (INDEPENDENT_AMBULATORY_CARE_PROVIDER_SITE_OTHER): Payer: 59 | Admitting: Internal Medicine

## 2014-07-16 VITALS — BP 168/90 | HR 73 | Temp 98.1°F | Wt 192.8 lb

## 2014-07-16 DIAGNOSIS — K4091 Unilateral inguinal hernia, without obstruction or gangrene, recurrent: Secondary | ICD-10-CM

## 2014-07-16 DIAGNOSIS — F17200 Nicotine dependence, unspecified, uncomplicated: Secondary | ICD-10-CM

## 2014-07-16 DIAGNOSIS — R030 Elevated blood-pressure reading, without diagnosis of hypertension: Secondary | ICD-10-CM

## 2014-07-16 DIAGNOSIS — R0989 Other specified symptoms and signs involving the circulatory and respiratory systems: Secondary | ICD-10-CM

## 2014-07-16 NOTE — Progress Notes (Signed)
   Subjective:    Patient ID: Tommy May, male    DOB: February 10, 1961, 53 y.o.   MRN: [id number]  HPI He has had an direct inguinal hernia for several years but in the last week he's noted a knot below that hernia. He previously could reduce the direct hernia but is unable to @ this time  This is been in the context of a cough present for approximately a week. He questions whether this might be related to his using CPAP over the last 2 weeks.  He does not monitor his blood pressure; he never filled the beta blocker. He questions whether the blood pressure is elevated related to an over-the-counter cough medicine with Sudafed.  He continues to smoke half a pack a day.      Review of Systems    Chest pain, palpitations, tachycardia, exertional dyspnea, paroxysmal nocturnal dyspnea, claudication or edema are absent.        Objective:   Physical Exam  Positive physical findings include: Repeat blood pressure 140/85 Small osteoma left forehead which is stable. A right carotid bruit is suggested. He has a grade 0/3-5 systolic murmur. Breath sounds are decreased without increased work of breathing He has a large direct hernia on the left which is not reducible. There is a smaller indirect hernia which can be reduced by pushing up through the scrotum. 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  General appearance :adequately nourished; in no distress. Eyes: No conjunctival inflammation or scleral icterus is present. Oral exam: Dental hygiene is good. Lips and gums are healthy appearing.There is no oropharyngeal erythema or exudate noted.  Heart:  Normal rate and regular rhythm. S1 and S2 normal without gallop, murmur, click, rub or other extra sounds   Lungs: no wheezes, rhonchi,rales ,or rubs present. Abdomen: bowel sounds normal, soft and non-tender without masses, organomegaly or hernias noted.  No guarding or rebound.  Skin:Warm & dry.  Intact without suspicious lesions or rashes  ; no jaundice or tenting Lymphatic: No lymphadenopathy is noted about the head, neck, axilla, or inguinal areas.             Assessment & Plan:  #1 direct left inguinal hernia, not reducible  #2 reducible indirect left inguinal hernia  #3 elevated blood pressure without diagnosis of hypertension  #4 possible right carotid bruit  Plan: Surgical referral  Home blood pressure monitor  Carotid Doppler.  E. cigarette discussed to help decrease airway irritation & cough

## 2014-07-16 NOTE — Progress Notes (Signed)
Pre visit review using our clinic review tool, if applicable. No additional management support is needed unless otherwise documented below in the visit note. 

## 2014-07-16 NOTE — Patient Instructions (Addendum)
Please consider using the E cigarette as we discussed. This could possibly decrease inflammation of the airways  Minimal Blood Pressure Goal= AVERAGE < 140/90;  Ideal is an AVERAGE < 135/85. This AVERAGE should be calculated from @ least 5-7 BP readings taken @ different times of day on different days of week. You should not respond to isolated BP readings , but rather the AVERAGE for that week .Please bring your  blood pressure cuff to office visits to verify that it is reliable.It  can also be checked against the blood pressure device at the pharmacy. Finger or wrist cuffs are not dependable; an arm cuff is.  Please take enteric-coated aspirin 81 mg daily with breakfast.

## 2014-07-17 DIAGNOSIS — R0989 Other specified symptoms and signs involving the circulatory and respiratory systems: Secondary | ICD-10-CM | POA: Insufficient documentation

## 2014-07-22 ENCOUNTER — Other Ambulatory Visit: Payer: Self-pay | Admitting: Internal Medicine

## 2014-07-22 DIAGNOSIS — M5412 Radiculopathy, cervical region: Secondary | ICD-10-CM

## 2014-07-22 MED ORDER — TRAMADOL HCL 50 MG PO TABS: 50.0000 mg | ORAL_TABLET | Freq: Three times a day (TID) | ORAL | Status: DC | PRN

## 2014-07-24 ENCOUNTER — Ambulatory Visit (HOSPITAL_COMMUNITY): Payer: 59 | Attending: Internal Medicine | Admitting: Cardiology

## 2014-07-24 DIAGNOSIS — I658 Occlusion and stenosis of other precerebral arteries: Secondary | ICD-10-CM | POA: Insufficient documentation

## 2014-07-24 DIAGNOSIS — Z87891 Personal history of nicotine dependence: Secondary | ICD-10-CM | POA: Diagnosis not present

## 2014-07-24 DIAGNOSIS — I6529 Occlusion and stenosis of unspecified carotid artery: Secondary | ICD-10-CM | POA: Insufficient documentation

## 2014-07-24 DIAGNOSIS — R0989 Other specified symptoms and signs involving the circulatory and respiratory systems: Secondary | ICD-10-CM | POA: Diagnosis present

## 2014-07-24 NOTE — Progress Notes (Signed)
Carotid duplex performed 

## 2014-08-04 ENCOUNTER — Ambulatory Visit (INDEPENDENT_AMBULATORY_CARE_PROVIDER_SITE_OTHER): Payer: 59 | Admitting: General Surgery

## 2014-08-04 ENCOUNTER — Other Ambulatory Visit (INDEPENDENT_AMBULATORY_CARE_PROVIDER_SITE_OTHER): Payer: Self-pay | Admitting: General Surgery

## 2014-08-05 ENCOUNTER — Encounter: Payer: Self-pay | Admitting: Pulmonary Disease

## 2014-08-05 ENCOUNTER — Ambulatory Visit (INDEPENDENT_AMBULATORY_CARE_PROVIDER_SITE_OTHER): Payer: 59 | Admitting: Pulmonary Disease

## 2014-08-05 VITALS — BP 150/90 | HR 74 | Temp 97.1°F | Ht 70.0 in | Wt 193.0 lb

## 2014-08-05 DIAGNOSIS — Z720 Tobacco use: Secondary | ICD-10-CM

## 2014-08-05 DIAGNOSIS — F17200 Nicotine dependence, unspecified, uncomplicated: Secondary | ICD-10-CM

## 2014-08-05 DIAGNOSIS — G4733 Obstructive sleep apnea (adult) (pediatric): Secondary | ICD-10-CM

## 2014-08-05 MED ORDER — VARENICLINE TARTRATE 0.5 MG X 11 & 1 MG X 42 PO MISC: ORAL | Status: DC

## 2014-08-05 NOTE — Patient Instructions (Signed)
CPAP is effective at 10 cm You can trial nasal pillows next time Trial of chantix -starter pack

## 2014-08-05 NOTE — Assessment & Plan Note (Signed)
CPAP is effective at 10 cm You can trial nasal pillows next time  Weight loss encouraged, compliance with goal of at least 4-6 hrs every night is the expectation. Advised against medications with sedative side effects Cautioned against driving when sleepy - understanding that sleepiness will vary on a day to day basis

## 2014-08-05 NOTE — Addendum Note (Signed)
Addended by: Lilli Few on: 08/05/2014 02:55 PM   Modules accepted: Orders

## 2014-08-05 NOTE — Progress Notes (Signed)
   Subjective:    Patient ID: Tommy May, male    DOB: 1961/09/16, 53 y.o.   MRN: [id number]  HPI  53 year old Smoker for FU of OSA.  His wife is a Armed forces operational officer, has witnessed apneas, loud snoring has gotten worse in recent months.  He reported intermittent palpitations almost daily, recent medical evaluation showed hypertension and hemoglobin level of 17.  He also reports difficulty with sleep maintenance and has been using his son's melatonin with some benefit.  He drinks 4-5 cups of coffee daily and 2 caffeinated beverages. He smokes about three fourths packs per day-about 30-pack-years   08/05/2014  Chief Complaint  Patient presents with  . Sleep Apnea    Pt is wearing cpap every night for about 6 hours a night. Pt has ordered a chin strap because he has been mouth breathing alot at night.    Continues to smoke 10 cigs/d, using e cig, has not cut down caffeine intake  HOlter showed freq apcs & PVCs  Home sleep study showed AHI 34/h CPAP was initiated at 10 centimeters with a medium nasal mask, using a chin strap now Download 07/2014 on 10 cm - AHI 5/h,good usage,leak ok Feels better rested, nocturia has decreased    Review of Systems neg for any significant sore throat, dysphagia, itching, sneezing, nasal congestion or excess/ purulent secretions, fever, chills, sweats, unintended wt loss, pleuritic or exertional cp, hempoptysis, orthopnea pnd or change in chronic leg swelling. Also denies presyncope, palpitations, heartburn, abdominal pain, nausea, vomiting, diarrhea or change in bowel or urinary habits, dysuria,hematuria, rash, arthralgias, visual complaints, headache, numbness weakness or ataxia.     Objective:   Physical Exam  Gen. Pleasant, well-nourished, in no distress ENT - no lesions, no post nasal drip Neck: No JVD, no thyromegaly, no carotid bruits Lungs: no use of accessory muscles, no dullness to percussion, clear without rales or rhonchi    Cardiovascular: Rhythm regular, heart sounds  normal, no murmurs or gallops, no peripheral edema Musculoskeletal: No deformities, no cyanosis or clubbing        Assessment & Plan:

## 2014-08-05 NOTE — Assessment & Plan Note (Signed)
Trial of chantix -starter pack -he will call for further Rx if this owrks Discussed side effects OK to ct e cig

## 2014-08-14 ENCOUNTER — Telehealth: Payer: Self-pay | Admitting: *Deleted

## 2014-08-14 NOTE — Telephone Encounter (Signed)
PA # [id number] Id # [id number] Called for PA. This will go to clinical review.  Will await decision.

## 2014-08-18 MED ORDER — BUPROPION HCL ER (SR) 150 MG PO TB12: 150.0000 mg | ORAL_TABLET | Freq: Two times a day (BID) | ORAL | Status: DC

## 2014-08-18 NOTE — Telephone Encounter (Signed)
Pl let him know Ok to send Rx for buproprion if pt willing 150 bid x 4 wks

## 2014-08-18 NOTE — Telephone Encounter (Signed)
lmomtcb x1 for pt 

## 2014-08-18 NOTE — Telephone Encounter (Signed)
chantix was denied. Pt must try and fail bupropion  Please advise RA thanks

## 2014-08-18 NOTE — Telephone Encounter (Signed)
Pt states that he wants to discuss this treatment with his wife. Requests that we phone this in to his pharmacy and he will pick up if he decides to take it.  Buproprion 150mg  BID x 4 weeks has been called into [facility] [address] Nothing further needed.

## 2014-08-18 NOTE — Telephone Encounter (Signed)
Pt returned call [phone]

## 2014-08-19 ENCOUNTER — Other Ambulatory Visit: Payer: Self-pay | Admitting: Internal Medicine

## 2014-08-19 ENCOUNTER — Encounter (HOSPITAL_COMMUNITY): Payer: Self-pay | Admitting: Pharmacy Technician

## 2014-08-19 DIAGNOSIS — M5412 Radiculopathy, cervical region: Secondary | ICD-10-CM

## 2014-08-19 MED ORDER — TRAMADOL HCL 50 MG PO TABS: ORAL_TABLET | ORAL | Status: DC

## 2014-08-19 NOTE — Telephone Encounter (Signed)
[facility]

## 2014-08-21 ENCOUNTER — Ambulatory Visit (HOSPITAL_COMMUNITY)
Admission: RE | Admit: 2014-08-21 | Discharge: 2014-08-21 | Disposition: A | Discharge status: Home / Self Care | Payer: 59 | Source: Ambulatory Visit | Attending: General Surgery | Admitting: General Surgery

## 2014-08-21 ENCOUNTER — Encounter (HOSPITAL_COMMUNITY)
Admission: RE | Admit: 2014-08-21 | Discharge: 2014-08-21 | Disposition: A | Discharge status: Home / Self Care | Payer: 59 | Source: Ambulatory Visit | Attending: General Surgery | Admitting: General Surgery

## 2014-08-21 ENCOUNTER — Encounter (HOSPITAL_COMMUNITY): Payer: Self-pay

## 2014-08-21 DIAGNOSIS — K4090 Unilateral inguinal hernia, without obstruction or gangrene, not specified as recurrent: Secondary | ICD-10-CM | POA: Insufficient documentation

## 2014-08-21 DIAGNOSIS — Z01818 Encounter for other preprocedural examination: Secondary | ICD-10-CM | POA: Insufficient documentation

## 2014-08-21 HISTORY — DX: Other allergic rhinitis: J30.89

## 2014-08-21 HISTORY — DX: Sleep apnea, unspecified: G47.30

## 2014-08-21 HISTORY — DX: Gastro-esophageal reflux disease without esophagitis: K21.9

## 2014-08-21 HISTORY — DX: Neuralgia and neuritis, unspecified: M79.2

## 2014-08-21 HISTORY — DX: Palpitations: R00.2

## 2014-08-21 HISTORY — DX: Other specified postprocedural states: Z98.890

## 2014-08-21 LAB — CBC WITH DIFFERENTIAL/PLATELET
Basophils Absolute: 0 10*3/uL (ref 0.0–0.1)
Basophils Relative: 0 % (ref 0–1)
Eosinophils Absolute: 0.1 10*3/uL (ref 0.0–0.7)
Eosinophils Relative: 1 % (ref 0–5)
HCT: 45.3 % (ref 39.0–52.0)
Hemoglobin: 16 g/dL (ref 13.0–17.0)
Lymphocytes Relative: 35 % (ref 12–46)
Lymphs Abs: 2.4 10*3/uL (ref 0.7–4.0)
MCH: 30.9 pg (ref 26.0–34.0)
MCHC: 35.3 g/dL (ref 30.0–36.0)
MCV: 87.5 fL (ref 78.0–100.0)
Monocytes Absolute: 0.6 10*3/uL (ref 0.1–1.0)
Monocytes Relative: 9 % (ref 3–12)
Neutro Abs: 3.9 10*3/uL (ref 1.7–7.7)
Neutrophils Relative %: 55 % (ref 43–77)
Platelets: 180 10*3/uL (ref 150–400)
RBC: 5.18 MIL/uL (ref 4.22–5.81)
RDW: 12.3 % (ref 11.5–15.5)
WBC: 7 10*3/uL (ref 4.0–10.5)

## 2014-08-21 LAB — COMPREHENSIVE METABOLIC PANEL
ALT: 16 U/L (ref 0–53)
AST: 13 U/L (ref 0–37)
Albumin: 3 g/dL — ABNORMAL LOW (ref 3.5–5.2)
Alkaline Phosphatase: 66 U/L (ref 39–117)
Anion gap: 12 (ref 5–15)
BUN: 9 mg/dL (ref 6–23)
CO2: 24 mEq/L (ref 19–32)
Calcium: 8.3 mg/dL — ABNORMAL LOW (ref 8.4–10.5)
Chloride: 104 mEq/L (ref 96–112)
Creatinine, Ser: 0.76 mg/dL (ref 0.50–1.35)
GFR calc Af Amer: >90 mL/min (ref >90)
GFR calc non Af Amer: >90 mL/min (ref >90)
Glucose, Bld: 106 mg/dL — ABNORMAL HIGH (ref 70–99)
Potassium: 4.1 mEq/L (ref 3.7–5.3)
Sodium: 140 mEq/L (ref 137–147)
Total Bilirubin: 0.3 mg/dL (ref 0.3–1.2)
Total Protein: 6.4 g/dL (ref 6.0–8.3)

## 2014-08-21 LAB — PROTIME-INR
INR: 0.9 (ref 0.00–1.49)
Prothrombin Time: 12.2 seconds (ref 11.6–15.2)

## 2014-08-21 NOTE — Progress Notes (Signed)
Patient informed Nurse that he had a stress test > 5 years ago, but denied having a cardiac cath. Patient has sleep apnea and wears a CPAP nightly. PCP is Unice Cobble and Pulmonologist is Dr. Elsworth Soho. Patient denied having any acute cardiac, pulmonary issues, or shortness of breath. Wife at chair side during PAT visit.

## 2014-08-21 NOTE — Pre-Procedure Instructions (Signed)
Tommy May  08/21/2014   Your procedure is scheduled on:  Monday October 5, 015 at 9:30 AM.  Report to [facility] Admitting at 7:30 AM.  Call this number if you have problems the morning of surgery: [phone]   Remember:   Do not eat food or drink liquids after midnight.   Take these medicines the morning of surgery with A SIP OF WATER: Acetaminophen (Tylenol) if needed, Bupropion (Wellbutrin), Esomeprazole (Nexium), Flonase nasal spray if needed, Loratadine (Claritin) if needed, Tramadol (Ultram) if needed   Discontinue aspirin and herbal medications 7 days prior to surgery   Do not wear jewelry.  Do not wear lotions, powders, or cologne.   Men may shave face and neck.  Do not bring valuables to the hospital.  [facility] is not responsible for any belongings or valuables.               Contacts, dentures or bridgework may not be worn into surgery.  Leave suitcase in the car. After surgery it may be brought to your room.  For patients admitted to the hospital, discharge time is determined by your treatment team.               Patients discharged the day of surgery will not be allowed to drive home.  Name and phone number of your driver: Family/Friend  Special Instructions: Shower using CHG soap the night before and the morning of your surgery   Please read over the following fact sheets that you were given: Pain Booklet, Coughing and Deep Breathing and Surgical Site Infection Prevention

## 2014-08-24 MED ORDER — CEFAZOLIN SODIUM-DEXTROSE 2-3 GM-% IV SOLR
2.0000 g | INTRAVENOUS | Status: AC
  Administered 2014-08-25: 2 g via INTRAVENOUS
  Filled 2014-08-24: qty 50

## 2014-08-25 ENCOUNTER — Encounter (HOSPITAL_COMMUNITY): Payer: Self-pay | Admitting: *Deleted

## 2014-08-25 ENCOUNTER — Ambulatory Visit (HOSPITAL_COMMUNITY)
Admission: RE | Admit: 2014-08-25 | Discharge: 2014-08-25 | Disposition: A | Discharge status: Home / Self Care | Payer: 59 | Source: Ambulatory Visit | Attending: General Surgery | Admitting: General Surgery

## 2014-08-25 ENCOUNTER — Encounter (HOSPITAL_COMMUNITY): Payer: 59 | Admitting: Anesthesiology

## 2014-08-25 ENCOUNTER — Encounter (HOSPITAL_COMMUNITY)
Admission: RE | Disposition: A | Discharge status: Home / Self Care | Payer: Self-pay | Source: Ambulatory Visit | Attending: General Surgery

## 2014-08-25 ENCOUNTER — Ambulatory Visit (HOSPITAL_COMMUNITY): Payer: 59 | Admitting: Anesthesiology

## 2014-08-25 DIAGNOSIS — I739 Peripheral vascular disease, unspecified: Secondary | ICD-10-CM | POA: Diagnosis not present

## 2014-08-25 DIAGNOSIS — I10 Essential (primary) hypertension: Secondary | ICD-10-CM | POA: Insufficient documentation

## 2014-08-25 DIAGNOSIS — Z79899 Other long term (current) drug therapy: Secondary | ICD-10-CM | POA: Diagnosis not present

## 2014-08-25 DIAGNOSIS — G4730 Sleep apnea, unspecified: Secondary | ICD-10-CM | POA: Insufficient documentation

## 2014-08-25 DIAGNOSIS — D176 Benign lipomatous neoplasm of spermatic cord: Secondary | ICD-10-CM | POA: Diagnosis not present

## 2014-08-25 DIAGNOSIS — K4090 Unilateral inguinal hernia, without obstruction or gangrene, not specified as recurrent: Secondary | ICD-10-CM | POA: Insufficient documentation

## 2014-08-25 DIAGNOSIS — K219 Gastro-esophageal reflux disease without esophagitis: Secondary | ICD-10-CM | POA: Insufficient documentation

## 2014-08-25 DIAGNOSIS — F17210 Nicotine dependence, cigarettes, uncomplicated: Secondary | ICD-10-CM | POA: Diagnosis not present

## 2014-08-25 DIAGNOSIS — D751 Secondary polycythemia: Secondary | ICD-10-CM | POA: Insufficient documentation

## 2014-08-25 HISTORY — PX: INSERTION OF MESH: SHX5868

## 2014-08-25 HISTORY — PX: INGUINAL HERNIA REPAIR: SHX194

## 2014-08-25 SURGERY — REPAIR, HERNIA, INGUINAL, ADULT
Anesthesia: General

## 2014-08-25 MED ORDER — LACTATED RINGERS IV SOLN
INTRAVENOUS | Status: DC | PRN
  Administered 2014-08-25 (×2): via INTRAVENOUS

## 2014-08-25 MED ORDER — DEXAMETHASONE SODIUM PHOSPHATE 4 MG/ML IJ SOLN
INTRAMUSCULAR | Status: AC
  Filled 2014-08-25: qty 1

## 2014-08-25 MED ORDER — FENTANYL CITRATE 0.05 MG/ML IJ SOLN
INTRAMUSCULAR | Status: AC
  Filled 2014-08-25: qty 2

## 2014-08-25 MED ORDER — ONDANSETRON HCL 4 MG/2ML IJ SOLN
INTRAMUSCULAR | Status: AC
  Filled 2014-08-25: qty 2

## 2014-08-25 MED ORDER — DEXAMETHASONE SODIUM PHOSPHATE 4 MG/ML IJ SOLN
INTRAMUSCULAR | Status: DC | PRN
  Administered 2014-08-25: 4 mg via INTRAVENOUS

## 2014-08-25 MED ORDER — PHENYLEPHRINE 40 MCG/ML (10ML) SYRINGE FOR IV PUSH (FOR BLOOD PRESSURE SUPPORT)
PREFILLED_SYRINGE | INTRAVENOUS | Status: AC
  Filled 2014-08-25: qty 10

## 2014-08-25 MED ORDER — HYDROMORPHONE HCL 1 MG/ML IJ SOLN
INTRAMUSCULAR | Status: AC
  Filled 2014-08-25: qty 1

## 2014-08-25 MED ORDER — MIDAZOLAM HCL 5 MG/5ML IJ SOLN
INTRAMUSCULAR | Status: DC | PRN
  Administered 2014-08-25: 2 mg via INTRAVENOUS

## 2014-08-25 MED ORDER — FENTANYL CITRATE 0.05 MG/ML IJ SOLN
INTRAMUSCULAR | Status: AC
  Filled 2014-08-25: qty 5

## 2014-08-25 MED ORDER — PHENYLEPHRINE HCL 10 MG/ML IJ SOLN
INTRAMUSCULAR | Status: DC | PRN
  Administered 2014-08-25 (×2): 80 ug via INTRAVENOUS

## 2014-08-25 MED ORDER — OXYCODONE HCL 5 MG PO TABS: 5.0000 mg | ORAL_TABLET | ORAL | Status: DC | PRN

## 2014-08-25 MED ORDER — PROPOFOL 10 MG/ML IV BOLUS
INTRAVENOUS | Status: AC
  Filled 2014-08-25: qty 20

## 2014-08-25 MED ORDER — LIDOCAINE HCL (CARDIAC) 20 MG/ML IV SOLN
INTRAVENOUS | Status: DC | PRN
  Administered 2014-08-25: 80 mg via INTRAVENOUS

## 2014-08-25 MED ORDER — MIDAZOLAM HCL 2 MG/2ML IJ SOLN
INTRAMUSCULAR | Status: AC
  Filled 2014-08-25: qty 2

## 2014-08-25 MED ORDER — PROPOFOL 10 MG/ML IV BOLUS
INTRAVENOUS | Status: DC | PRN
  Administered 2014-08-25: 200 mg via INTRAVENOUS

## 2014-08-25 MED ORDER — LACTATED RINGERS IV SOLN
INTRAVENOUS | Status: DC
  Administered 2014-08-25: 08:00:00 via INTRAVENOUS

## 2014-08-25 MED ORDER — FENTANYL CITRATE 0.05 MG/ML IJ SOLN
25.0000 ug | INTRAMUSCULAR | Status: DC | PRN
  Administered 2014-08-25 (×2): 50 ug via INTRAVENOUS

## 2014-08-25 MED ORDER — LIDOCAINE HCL (CARDIAC) 20 MG/ML IV SOLN
INTRAVENOUS | Status: AC
  Filled 2014-08-25: qty 5

## 2014-08-25 MED ORDER — PROMETHAZINE HCL 25 MG/ML IJ SOLN: 6.2500 mg | INTRAMUSCULAR | Status: DC | PRN

## 2014-08-25 MED ORDER — ONDANSETRON HCL 4 MG/2ML IJ SOLN
INTRAMUSCULAR | Status: DC | PRN
Start: 2014-08-25 — End: 2014-08-25
  Administered 2014-08-25: 4 mg via INTRAVENOUS

## 2014-08-25 MED ORDER — ARTIFICIAL TEARS OP OINT
TOPICAL_OINTMENT | OPHTHALMIC | Status: AC
  Filled 2014-08-25: qty 3.5

## 2014-08-25 MED ORDER — BUPIVACAINE-EPINEPHRINE 0.5% -1:200000 IJ SOLN
INTRAMUSCULAR | Status: DC | PRN
  Administered 2014-08-25: 30 mL

## 2014-08-25 MED ORDER — BUPIVACAINE-EPINEPHRINE (PF) 0.5% -1:200000 IJ SOLN
INTRAMUSCULAR | Status: AC
  Filled 2014-08-25: qty 30

## 2014-08-25 MED ORDER — FENTANYL CITRATE 0.05 MG/ML IJ SOLN
INTRAMUSCULAR | Status: DC | PRN
  Administered 2014-08-25 (×3): 50 ug via INTRAVENOUS

## 2014-08-25 MED ORDER — 0.9 % SODIUM CHLORIDE (POUR BTL) OPTIME
TOPICAL | Status: DC | PRN
  Administered 2014-08-25: 1000 mL

## 2014-08-25 SURGICAL SUPPLY — 46 items
APL SKNCLS STERI-STRIP NONHPOA (GAUZE/BANDAGES/DRESSINGS) ×2
BENZOIN TINCTURE PRP APPL 2/3 (GAUZE/BANDAGES/DRESSINGS) ×4 IMPLANT
BLADE SURG 10 STRL SS (BLADE) ×4 IMPLANT
BLADE SURG 15 STRL LF DISP TIS (BLADE) ×2 IMPLANT
BLADE SURG 15 STRL SS (BLADE) ×4
BLADE SURG ROTATE 9660 (MISCELLANEOUS) IMPLANT
CHLORAPREP W/TINT 26ML (MISCELLANEOUS) ×4 IMPLANT
CLOSURE WOUND 1/2 X4 (GAUZE/BANDAGES/DRESSINGS) ×1
COVER SURGICAL LIGHT HANDLE (MISCELLANEOUS) ×4 IMPLANT
DRAIN PENROSE 1/2X12 LTX STRL (WOUND CARE) IMPLANT
DRAPE INCISE IOBAN 66X45 STRL (DRAPES) ×4 IMPLANT
DRAPE LAPAROTOMY TRNSV 102X78 (DRAPE) ×4 IMPLANT
DRAPE UTILITY 15X26 W/TAPE STR (DRAPE) ×8 IMPLANT
DRSG TEGADERM 4X4.75 (GAUZE/BANDAGES/DRESSINGS) ×4 IMPLANT
DRSG TELFA 3X8 NADH (GAUZE/BANDAGES/DRESSINGS) ×4 IMPLANT
ELECT CAUTERY BLADE 6.4 (BLADE) ×4 IMPLANT
ELECT REM PT RETURN 9FT ADLT (ELECTROSURGICAL) ×4
ELECTRODE REM PT RTRN 9FT ADLT (ELECTROSURGICAL) ×2 IMPLANT
GLOVE BIOGEL PI IND STRL 8 (GLOVE) ×2 IMPLANT
GLOVE BIOGEL PI INDICATOR 8 (GLOVE) ×2
GLOVE ECLIPSE 8.0 STRL XLNG CF (GLOVE) ×4 IMPLANT
GOWN STRL REUS W/ TWL LRG LVL3 (GOWN DISPOSABLE) ×4 IMPLANT
GOWN STRL REUS W/TWL LRG LVL3 (GOWN DISPOSABLE) ×8
KIT BASIN OR (CUSTOM PROCEDURE TRAY) ×4 IMPLANT
KIT ROOM TURNOVER OR (KITS) ×4 IMPLANT
MESH HERNIA 3X6 (Mesh General) ×2 IMPLANT
NDL HYPO 25GX1X1/2 BEV (NEEDLE) ×2 IMPLANT
NEEDLE HYPO 25GX1X1/2 BEV (NEEDLE) ×4 IMPLANT
NS IRRIG 1000ML POUR BTL (IV SOLUTION) ×4 IMPLANT
PACK SURGICAL SETUP 50X90 (CUSTOM PROCEDURE TRAY) ×4 IMPLANT
PAD ARMBOARD 7.5X6 YLW CONV (MISCELLANEOUS) ×4 IMPLANT
PAD DRESSING TELFA 3X8 NADH (GAUZE/BANDAGES/DRESSINGS) ×2 IMPLANT
PENCIL BUTTON HOLSTER BLD 10FT (ELECTRODE) ×4 IMPLANT
SPECIMEN JAR SMALL (MISCELLANEOUS) IMPLANT
SPONGE LAP 18X18 X RAY DECT (DISPOSABLE) ×4 IMPLANT
STRIP CLOSURE SKIN 1/2X4 (GAUZE/BANDAGES/DRESSINGS) ×3 IMPLANT
SUT MON AB 4-0 PC3 18 (SUTURE) ×4 IMPLANT
SUT PROLENE 2 0 CT2 30 (SUTURE) ×8 IMPLANT
SUT SILK 2 0 SH (SUTURE) IMPLANT
SUT VIC AB 2-0 SH 18 (SUTURE) ×4 IMPLANT
SUT VIC AB 3-0 SH 27 (SUTURE) ×8
SUT VIC AB 3-0 SH 27XBRD (SUTURE) ×2 IMPLANT
SUT VICRYL AB 3 0 TIES (SUTURE) ×4 IMPLANT
SYR CONTROL 10ML LL (SYRINGE) ×4 IMPLANT
TOWEL OR 17X24 6PK STRL BLUE (TOWEL DISPOSABLE) ×4 IMPLANT
TOWEL OR 17X26 10 PK STRL BLUE (TOWEL DISPOSABLE) ×4 IMPLANT

## 2014-08-25 NOTE — Transfer of Care (Signed)
Immediate Anesthesia Transfer of Care Note  Patient: Tommy May  Procedure(s) Performed: Procedure(s): LEFT INGUINAL HERNIA REPAIR REMOVAL SPERMATIC CORD MASS (Left) INSERTION OF MESH (N/A)  Patient Location: PACU  Anesthesia Type:General  Level of Consciousness: awake, alert  and oriented  Airway & Oxygen Therapy: Patient Spontanous Breathing and Patient connected to nasal cannula oxygen  Post-op Assessment: Report given to PACU RN and Post -op Vital signs reviewed and stable  Post vital signs: Reviewed and stable  Complications: No apparent anesthesia complications

## 2014-08-25 NOTE — Anesthesia Preprocedure Evaluation (Addendum)
Anesthesia Evaluation  Patient identified by MRN, date of birth, ID band Patient awake    Reviewed: Allergy & Precautions, H&P , NPO status , Patient's Chart, lab work & pertinent test results  Airway Mallampati: II TM Distance: >3 FB Neck ROM: Full    Dental no notable dental hx.    Pulmonary sleep apnea , Current Smoker,  breath sounds clear to auscultation  Pulmonary exam normal       Cardiovascular Exercise Tolerance: Good hypertension, + Peripheral Vascular Disease + dysrhythmias Rhythm:Regular Rate:Normal  H/O palpitations and holter monitoring.  Right carotid bruit.   Neuro/Psych  Neuromuscular disease negative psych ROS   GI/Hepatic Neg liver ROS, GERD-  Medicated,  Endo/Other  negative endocrine ROS  Renal/GU negative Renal ROS  negative genitourinary   Musculoskeletal negative musculoskeletal ROS (+)   Abdominal   Peds negative pediatric ROS (+)  Hematology negative hematology ROS (+)   Anesthesia Other Findings   Reproductive/Obstetrics negative OB ROS                          Anesthesia Physical Anesthesia Plan  ASA: III  Anesthesia Plan: General   Post-op Pain Management:    Induction: Intravenous  Airway Management Planned: LMA  Additional Equipment:   Intra-op Plan:   Post-operative Plan: Extubation in OR  Informed Consent: I have reviewed the patients History and Physical, chart, labs and discussed the procedure including the risks, benefits and alternatives for the proposed anesthesia with the patient or authorized representative who has indicated his/her understanding and acceptance.   Dental advisory given  Plan Discussed with: CRNA  Anesthesia Plan Comments:         Anesthesia Quick Evaluation

## 2014-08-25 NOTE — Interval H&P Note (Signed)
History and Physical Interval Note:  08/25/2014 8:56 AM  Tommy May  has presented today for surgery, with the diagnosis of left inguinal hernia. Left spermatic cord mass.  The various methods of treatment have been discussed with the patient and family. After consideration of risks, benefits and other options for treatment, the patient has consented to  Procedure(s): LEFT INGUINAL HERNIA REPAIR REMOVAL SPERMATIC CORD MASS (Left) INSERTION OF MESH (N/A) as a surgical intervention .  The patient's history has been reviewed, patient examined, no change in status, stable for surgery.  I have reviewed the patient's chart and labs.  Questions were answered to the patient's satisfaction.     [name]

## 2014-08-25 NOTE — Op Note (Signed)
Preoperative diagnosis:  Left inguinal hernia, left spermatic cord mass  Postoperative diagnosis:  Same  Procedure:  Left inguinal hernia repair with mesh, removal of 4 cm left spermatic cord mass  Surgeon:  Jackolyn Confer, M.D.  Anesthesia:  General/LMA with local (Marc[place]).  Indication:  Tommy May is a 53 year old male who was noted to have a mass on his left spermatic cord as well as a left inguinal hernia. He now presents for the above procedure.  Technique:  He was seen in the holding room and the left groin was marked with my initials. He was brought to the operating, placed supine on the operating table, and the anesthetic was administered by the anesthesiologist. The hair in the groin area was clipped as was felt to be necessary. This area was then sterilely prepped and draped.  Local anesthetic was infiltrated in the superficial and deep tissues in the left groin.  An incision was made through the skin and subcutaneous tissue until the external oblique aponeurosis was identified.  Local anesthetic was infiltrated deep to the external oblique aponeurosis. The external oblique aponeurosis was divided through the external ring medially and back toward the anterior superior iliac spine laterally. Using blunt dissection, the shelving edge of the inguinal ligament was identified inferiorly and the internal oblique aponeurosis and muscle were identified superiorly. The ilioinguinal nerve was identified and preserved.  The spermatic cord was isolated and a posterior window was made around it.  A lobulated, firm, fatty mass was adherent to the cord.  This was dissected free of the cord and sent to pathology.  An indirect hernia sac was identified and separated from the spermatic cord using blunt dissection.  The sac was opened and omentum was reduced.  High ligation of the sac was then performed using 2-0 Vicryl suture.  The hernia sac and its contents were reduced through the indirect hernia  defect.  A small direct defect was noted as well.   A piece of 3" x 6" polypropylene mesh was brought into the field and anchored 1-2 cm medial to the pubic tubercle with 2-0 Prolene suture. The inferior aspect of the mesh was anchored to the shelving edge of the inguinal ligament with running 2-0 Prolene suture to a level 1-2 cm lateral to the internal ring. A slit was cut in the mesh creating 2 tails. These were wrapped around the spermatic cord. The superior aspect of the mesh was anchored to the internal oblique aponeurosis and muscle with interrupted 2-0 Vicryl sutures. The 2 tails of the mesh were then crossed creating a new internal ring and were anchored to the shelving edge of the inguinal ligament with 2-0 Prolene suture. The tip of a hemostat could be placed through the new aperture. The lateral aspect of the mesh was then tucked deep to the external oblique aponeurosis.  Local anesthetic was infiltrated into the wound.  The wound was inspected and hemostasis was adequate. The external oblique aponeurosis was then closed over the mesh and cord with running 3-0 Vicryl suture. The subcutaneous tissue was closed with running 3-0 Vicryl suture. The skin closed with a running 4-0 Monocryl subcuticular stitch.  Steri-Strips and a sterile dressing were applied.  The procedure was well-tolerated without any apparent complications and he was taken to the recovery room in satisfactory condition.

## 2014-08-25 NOTE — Anesthesia Postprocedure Evaluation (Signed)
  Anesthesia Post-op Note  Patient: Tommy May  Procedure(s) Performed: Procedure(s) (LRB): LEFT INGUINAL HERNIA REPAIR REMOVAL SPERMATIC CORD MASS (Left) INSERTION OF MESH (N/A)  Patient Location: PACU  Anesthesia Type: General  Level of Consciousness: awake and alert   Airway and Oxygen Therapy: Patient Spontanous Breathing  Post-op Pain: mild  Post-op Assessment: Post-op Vital signs reviewed, Patient's Cardiovascular Status Stable, Respiratory Function Stable, Patent Airway and No signs of Nausea or vomiting  Last Vitals:  Filed Vitals:   08/25/14 1156  BP: 154/81  Pulse: 66  Temp:   Resp: 15    Post-op Vital Signs: stable   Complications: No apparent anesthesia complications

## 2014-08-25 NOTE — H&P (Signed)
Tommy May 08/04/2014 9:28 AM Location: [facility] Patient #: [id number] DOB: [birth year] Undefined / Language: Tommy May / Race: White Male  History of Present Illness Tommy Hollingshead MD; 08/04/2014 10:04 AM) Patient words: hernia.  The patient is a 53 year old male   Note:He is referred by Dr. Linna Darner because of a left inguinal hernia. The hernia has been present for some time. He was at the beach recently and coughed a lot. He subsequently noticed a painful lump below the hernia. This was noted by Dr. Linna Darner as well. He has a family history of hernia-his father. He has some urinary urgency but no straining. No constipation. He does not have a chronic cough. He is a smoker.   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  Diagnostic Studies History (Tommy May, Tommy May; 08/04/2014 9:31 AM) Colonoscopy never  Allergies ([place], [place]; 08/04/2014 9:31 AM) No Known Drug Allergies09/14/2015  Medication History ([place], Tommy May; 08/04/2014 9:33 AM) NexIUM (10MG  Packet, Oral) Active. Gabapentin (300MG  Capsule, Oral) Active. Loratadine (10MG  Capsule, Oral) Active. TraMADol HCl (50MG  Tablet, Oral) Active. Medications Reconciled  Social History Tommy May [place], Tommy May; 08/04/2014 9:31 AM) Caffeine use Carbonated beverages, Coffee, Tea. Illicit drug use Prefer to discuss with provider. No alcohol use Tobacco use Current every day smoker.  Family History ([place], Tommy May; 08/04/2014 9:31 AM) Cerebrovascular Accident Father.  Review of Systems ([place] Tommy May; 08/04/2014 9:31 AM) General Not Present- Appetite Loss, Chills, Fatigue, Fever, Night Sweats, Weight Gain  and Weight Loss. Skin Not Present- Change in Wart/Mole, Dryness, Hives, Jaundice, New Lesions, Non-Healing Wounds, Rash and Ulcer. HEENT Present- Seasonal Allergies. Not Present- Earache, Hearing Loss, Hoarseness, Nose Bleed, Oral Ulcers, Ringing in the Ears, Sinus Pain, Sore Throat, Visual Disturbances, Wears glasses/contact lenses and Yellow Eyes. Respiratory Not Present- Bloody sputum, Chronic Cough, Difficulty Breathing, Snoring and Wheezing. Breast Not Present- Breast Mass, Breast Pain, Nipple Discharge and Skin Changes. Cardiovascular Present- Palpitations. Not Present- Chest Pain, Difficulty Breathing Lying Down, Leg Cramps, Rapid Heart Rate, Shortness of Breath and Swelling of Extremities. Gastrointestinal Present- Indigestion. Not Present- Abdominal Pain, Bloating, Bloody Stool, Change in Bowel Habits, Chronic diarrhea, Constipation, Difficulty Swallowing, Excessive gas, Gets full quickly at meals, Hemorrhoids, Nausea, Rectal Pain and Vomiting. Male Genitourinary Present- Frequency and Urgency. Not Present- Blood in Urine, Change in Urinary Stream, Impotence, Nocturia, Painful Urination and Urine Leakage. Musculoskeletal Present- Joint Stiffness. Not Present- Back Pain, Joint Pain, Muscle Pain, Muscle Weakness and Swelling of Extremities. Neurological Not Present- Decreased Memory, Fainting, Headaches, Numbness, Seizures, Tingling, Tremor, Trouble walking and Weakness. Psychiatric Not Present- Anxiety, Bipolar, Change in Sleep Pattern, Depression, Fearful and Frequent crying. Endocrine Not Present- Cold Intolerance, Excessive Hunger, Hair Changes, Heat Intolerance, Hot flashes and New Diabetes. Hematology Not Present- Easy Bruising, Excessive bleeding, Gland problems, HIV and Persistent Infections.   Vitals (Tommy May Tommy May; 08/04/2014 9:30 AM) 08/04/2014 9:28 AM Weight: 191.2 lb Height: 70in Body Surface Area: 2.07 m Body Mass Index: 27.43 kg/m Temp.: 98.24F(Oral)   Pulse: 83 (Regular)  P.OX: 98% (Room air) BP: 166/100 (Sitting, Left Arm, Standard)    Physical Exam Tommy Hollingshead MD; 08/04/2014 10:06 AM) The physical exam findings are as follows: Note:General: Overweight in NAD. Pleasant and cooperative.  HEENT: [place]/AT, 1.5 cm subcutaneous soft tissue mass on forehead  EYES: EOMI, no icterus  CV: RRR, no murmur, no JVD.  CHEST: Breath sounds equal and clear. Respirations nonlabored.  ABDOMEN: Soft, nontender, nondistended, no masses, no organomegaly, active bowel sounds, no scars, no hernias.  ANORECTAL: No fissures. Normal sphincter tone. No masses.  GU: Right inguinal bulge in the standing position with a cough. Moderate sized left inguinal bulge that is reducible in the supine position. There is a small, smooth mass on the upper portion of the left spermatic cord.  MUSCULOSKELETAL: FROM, good muscle tone, no edema, no venous stasis changes.  NEUROLOGIC: Alert and oriented, answers questions appropriately, normal gait and station.  PSYCHIATRIC: Normal mood, affect , and behavior.    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 He does have bilateral inguinal hernias with the right side being small and completely asymptomatic.  Plan: I recommended left inguinal hernia repair with mesh by way of open technique with removal of spermatic cord mass. As for the right side, if that becomes larger or symptomatic we can deal with that in the future.  I have explained the procedure, risks, and aftercare of inguinal hernia repair. Risks include but are not limited to bleeding, infection, wound problems, anesthesia, recurrence, bladder or intestine injury, urinary retention, testicular dysfunction, chronic  pain, mesh problems. He seems to understand and agrees to proceed.   Tommy May, M.D.

## 2014-08-25 NOTE — Discharge Instructions (Addendum)
[facility] _______[facility]  INGUINAL HERNIA REPAIR: POST OP INSTRUCTIONS  Always review your discharge instruction sheet given to you by the facility where your surgery was performed. IF YOU HAVE DISABILITY OR FAMILY LEAVE FORMS, YOU MUST BRING THEM TO THE OFFICE FOR PROCESSING.   DO NOT GIVE THEM TO YOUR DOCTOR.  1. A  prescription for pain medication may be given to you upon discharge.  Take your pain medication as prescribed, if needed.  If narcotic pain medicine is not needed, then you may take acetaminophen (Tylenol) or ibuprofen (Advil) as needed. 2. Take your usually prescribed medications unless otherwise directed. 3. If you need a refill on your pain medication, please contact your pharmacy.  They will contact our office to request authorization. Prescriptions will not be filled after 5 pm or on week-ends. 4. You should follow a light diet the first 24 hours after arrival home, such as soup and crackers, etc.  Be sure to include lots of fluids daily.  Resume your normal diet the day after surgery. 5. Most patients will experience some swelling and bruising around the umbilicus or in the groin and scrotum.  Ice packs and reclining will help.  Swelling and bruising can take several days to resolve.  6. It is common to experience some constipation if taking pain medication after surgery.  Increasing fluid intake and taking a stool softener (such as Colace) will usually help or prevent this problem from occurring.  A mild laxative (Milk of Magnesia or Miralax) should be taken according to package directions if there are no bowel movements after 48 hours. 7. Unless discharge instructions indicate otherwise, you may remove your bandages 72 hours after surgery, and you may shower at that time.  You may have steri-strips (small skin tapes) in place directly over the incision.  These strips should be left on the skin.  If your surgeon used skin glue on the incision, you may shower in 24  hours.  The glue will flake off over the next 2-3 weeks.  Any sutures or staples will be removed at the office during your follow-up visit. 8. ACTIVITIES:  You may resume regular (light) daily activities beginning the next day--such as daily self-care, walking, climbing stairs--gradually increasing activities as tolerated.  You may have sexual intercourse when it is comfortable.  Refrain from any heavy lifting or straining for six weeks, nothing over 10 pounds.  a. You may drive when you are no longer taking prescription pain medication, you can comfortably wear a seatbelt, and you can safely maneuver your car and apply brakes. b. 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 You should see your doctor in the office for a follow-up appointment approximately 2-3 weeks after your surgery.  Make sure that you call for this appointment within a day or two after you arrive home to insure a convenient appointment time. 10. OTHER INSTRUCTIONS:  __________________________________________________________________________________________________________________________________________________________________________________________  WHEN TO CALL YOUR DOCTOR: 1. Fever over 101.0 2. Inability to urinate 3. Nausea and/or vomiting 4. Extreme swelling or bruising 5. Continued bleeding from incision. 6. Increased pain, redness, or drainage from the incision  The clinic staff is available to answer your questions during regular business hours.  Please dont hesitate to call and ask to speak to one of the nurses for clinical concerns.  If you have a medical emergency, go to the nearest emergency room or call 911.  A surgeon from [facility] is always on call at the hospital  [address], [address], [place], [place]  [address] ? ° [address] [address], [place], [place]   [address] °[phone] ? [phone] ? FAX [phone] °Web site:  [email or web address] ° °What to eat: ° °For your first meals, you should eat lightly; only small meals initially.  If you do not have nausea, you may eat larger meals.  Avoid spicy, greasy and heavy food.   ° °General Anesthesia, Adult, Care After  °Refer to this sheet in the next few weeks. These instructions provide you with information on caring for yourself after your procedure. Your health care provider may also give you more specific instructions. Your treatment has been planned according to current medical practices, but problems sometimes occur. Call your health care provider if you have any problems or questions after your procedure.  °WHAT TO EXPECT AFTER THE PROCEDURE  °After the procedure, it is typical to experience:  °Sleepiness.  °Nausea and vomiting. °HOME CARE INSTRUCTIONS  °For the first 24 hours after general anesthesia:  °Have a responsible person with you.  °Do not drive a car. If you are alone, do not take public transportation.  °Do not drink alcohol.  °Do not take medicine that has not been prescribed by your health care provider.  °Do not sign important papers or make important decisions.  °You may resume a normal diet and activities as directed by your health care provider.  °Change bandages (dressings) as directed.  °If you have questions or problems that seem related to general anesthesia, call the hospital and ask for the anesthetist or anesthesiologist on call. °SEEK MEDICAL CARE IF:  °You have nausea and vomiting that continue the day after anesthesia.  °You develop a rash. °SEEK IMMEDIATE MEDICAL CARE IF:  °You have difficulty breathing.  °You have chest pain.  °You have any allergic problems. °Document Released: 02/13/2001 Document Revised: 07/10/2013 Document Reviewed: 05/23/2013  °ExitCare® Patient Information ©2014 [employer].  ° ° °

## 2014-08-28 ENCOUNTER — Encounter (HOSPITAL_COMMUNITY): Payer: Self-pay | Admitting: General Surgery

## 2014-08-29 ENCOUNTER — Telehealth (INDEPENDENT_AMBULATORY_CARE_PROVIDER_SITE_OTHER): Payer: Self-pay

## 2014-08-29 NOTE — Telephone Encounter (Signed)
May have a refill of Oxy IR 5 mg, 1-2 po every 4 hours are needed for pain, Disp# 40, no refill.  Please see if an MD in the office will sign.

## 2014-08-29 NOTE — Telephone Encounter (Signed)
Pt s/p left inguinal hernia on 08/25/14 by Dr Zella Richer. Pt is calling today to get a refill on his Oxycodone 5mg . Pt rates his pain a 8 out of 10 at this time. Pt states that he is worried that he will run out over the weekend. Advised pt that he can also place heat/ice on the area and take up to 800mg  of Ibuprofen TID. Pt also is having some issues with contsipation. Advised pt that he can decrease on his pain meds, make sure that he is drinking plenty of fluids and walking as much as possible. Pt states that he has been taking Miralax to help with this. Informed pt that I would send Dr Zella Richer a message and we would contact them as soon as we recieve a message. Pt verbalized understanding

## 2014-09-06 ENCOUNTER — Other Ambulatory Visit: Payer: Self-pay | Admitting: Internal Medicine

## 2014-10-13 ENCOUNTER — Telehealth: Payer: Self-pay | Admitting: Internal Medicine

## 2014-10-13 ENCOUNTER — Other Ambulatory Visit: Payer: Self-pay | Admitting: Internal Medicine

## 2014-10-13 MED ORDER — GABAPENTIN 300 MG PO CAPS: 300.0000 mg | ORAL_CAPSULE | Freq: Every day | ORAL | Status: DC

## 2014-10-13 NOTE — Telephone Encounter (Signed)
[facility] [phone]

## 2014-10-13 NOTE — Telephone Encounter (Signed)
Pt send message through mychart (pls see note) he is not using the mail order and needs Gabapentin filled through pharmacy.

## 2014-11-06 ENCOUNTER — Ambulatory Visit: Payer: 59 | Admitting: Adult Health

## 2014-12-29 ENCOUNTER — Telehealth: Payer: Self-pay | Admitting: Internal Medicine

## 2014-12-29 NOTE — Telephone Encounter (Signed)
PLEASE NOTE: All timestamps contained within this report are represented as [place] Standard Time. CONFIDENTIALTY NOTICE: This fax transmission is intended only for the addressee. It contains information that is legally privileged, confidential or otherwise protected from use or disclosure. If you are not the intended recipient, you are strictly prohibited from reviewing, disclosing, copying using or disseminating any of this information or taking any action in reliance on or regarding this information. If you have received this fax in error, please notify us immediately by telephone so that we can arrange for its return to [place]. Phone: [phone], Toll-Free: [phone], Fax: [phone] Page: 1 of 1 Call Id: [id number] [facility] [place] Patient Name: Tommy May DOB: 1961/09/20 Initial Comment Caller states her husband is light headed and having palpitations and is fatigued. Nurse Assessment Nurse: Donalynn Furlong, RN, Myna Hidalgo Date/Time Eilene Ghazi Time): 12/29/2014 1:59:48 PM Confirm and document reason for call. If symptomatic, describe symptoms. ---Caller states her husband is light headed and having palpitations and is fatigued. Has had this for 6-8 months , but has had a lot of stress (at work!!) these past couple of weeks, and it seems a little worse. Has also been diagnosed with sleep apnea and uses his CPAP now and is compliant. Has the patient traveled out of the country within the last 30 days? ---No Does the patient require triage? ---Yes Related visit to physician within the last 2 weeks? ---No Does the PT have any chronic conditions? (i.e. diabetes, asthma, etc.) ---Yes List chronic conditions. ---sleep apnea, occasional high blood pressure with increased stress Guidelines Guideline Title Affirmed Question Affirmed Notes Heart Rate and Heartbeat Questions Problems with anxiety or stress Final  Disposition User See PCP within [place], RN, Myna Hidalgo Comments caller(wife) states that he is currently not having any of these symptoms and went back to work. Also states that since starting his CPAP tx., he has been doing much better . States he has the "flutterings" at periods of high stress at work. States he will not go in for a check up unless one is made for him to see how he is doing. Wants [place] to set up the appt for him to have a routine check up. Discussed all S/S chest pain/cardiac irregularity to report, caller with good comprehension.

## 2014-12-30 ENCOUNTER — Telehealth: Payer: Self-pay | Admitting: *Deleted

## 2014-12-30 NOTE — Telephone Encounter (Signed)
[facility] [place] [facility] Patient Name: Tommy May Gender: Male DOB: December 29, 1960 Age: 54 Y 9 M 21 D Return Phone Number: [phone] (Primary) Address: City/State/Zip:  Client [facility] - Client Client Site [place] - Day Physician Unice Cobble Contact Type Call Call Type Triage / [place] Name Katie Relationship To Patient Spouse Appointment Disposition EMR Appointment Scheduled Return Phone Number [phone] (Primary) Chief Complaint Heart palpitations or irregular heartbeat Initial Comment Caller states her husband is light headed and having palpitations and is fatigued. PreDisposition Call Doctor Info pasted into Epic Yes Nurse Assessment Nurse: Donalynn Furlong, RN, Myna Hidalgo Date/Time Eilene Ghazi Time): 12/29/2014 1:59:48 PM Confirm and document reason for call. If symptomatic, describe symptoms. ---Caller states her husband is light headed and having palpitations and is fatigued. Has had this for 6-8 months , but has had a lot of stress (at work!!) these past couple of weeks, and it seems a little worse. Has also been diagnosed with sleep apnea and uses his CPAP now and is compliant. Has the patient traveled out of the country within the last 30 days? ---No Does the patient require triage? ---Yes Related visit to physician within the last 2 weeks? ---No Does the PT have any chronic conditions? (i.e. diabetes, asthma, etc.) ---Yes List chronic conditions. ---sleep apnea, occasional high blood pressure with increased stress Guidelines Guideline Title Affirmed Question Affirmed Notes Nurse Date/Time Eilene Ghazi Time) Heart Rate and Heartbeat Questions Problems with anxiety or stress Donalynn Furlong, RN, Myna Hidalgo 12/29/2014 2:05:42 PM Disp. Time Eilene Ghazi Time) Disposition Final User 12/29/2014 2:15:24 PM See PCP within 2 Weeks Yes Donalynn Furlong, RN, Myna Hidalgo PLEASE NOTE: All  timestamps contained within this report are represented as [place] Standard Time. CONFIDENTIALTY NOTICE: This fax transmission is intended only for the addressee. It contains information that is legally privileged, confidential or otherwise protected from use or disclosure. If you are not the intended recipient, you are strictly prohibited from reviewing, disclosing, copying using or disseminating any of this information or taking any action in reliance on or regarding this information. If you have received this fax in error, please notify us immediately by telephone so that we can arrange for its return to [place]. Phone: [phone], Toll-Free: [phone], Fax: [phone] Page: 2 of 2 Call Id: [id number] [place] Understands: Yes Disagree/Comply: Comply Care Advice Given Per Guideline SEE PCP WITHIN 2 WEEKS: You need an evaluation for this ongoing problem within the next 2 weeks. Call your doctor during regular office hours and make an appointment. REASSURANCE: * Everybody experiences palpitations at some point in their lives. In many circumstances it is simply a heightened awareness of the heart's normal beating. * Patients with anxiety or stress may describe a 'rapid heart beat' or 'pounding' in their chest from their heart beating. * Occasional extra heart beats are experienced by most everyone. Lack of sleep, stress, and caffeinated beverages can aggravate this condition. HEALTH BASICS: * Sleep - Try to get sufficient amount of sleep. Lack of sleep can aggravate palpitations. Most people need 7-8 hours of sleep each night. * Eat a balanced healthy diet. * Regular exercise will improve your overall health, improve your mood, and is a simple method to reduce stress. * Drink adequate liquids - 6-8 glasses of water daily. AVOID CAFFEINE: * Avoid caffeine-containing beverages (Reason: caffeine is a stimulant and can aggravate palpitations). * Examples include coffee, tea, colas, [address] [employer],  and some 'energy  drinks'. LIMIT ALCOHOL: Limit your alcohol consumption to no more than 2 drinks a day. Ideally, eliminate alcohol entirely for the next two weeks. OTHER: * For smokers - Stop or reduce your smoking * Avoid diet pills (Reason: they act as stimulants.) CALL BACK IF: * Chest pain, lightheadedness or difficulty breathing occurs * Heart beating over 130 beats / minute * over 3 extra or skipped beats / minute * You become worse. CARE ADVICE given per Palpitations (Adult) guideline. After Care Instructions Given Call Event Type User Date / Time Description

## 2015-01-09 ENCOUNTER — Other Ambulatory Visit: Payer: Self-pay | Admitting: Internal Medicine

## 2015-01-09 NOTE — Telephone Encounter (Signed)
OK X 3 mos 

## 2015-01-12 ENCOUNTER — Encounter: Payer: Self-pay | Admitting: Internal Medicine

## 2015-01-12 ENCOUNTER — Other Ambulatory Visit (INDEPENDENT_AMBULATORY_CARE_PROVIDER_SITE_OTHER): Payer: 59

## 2015-01-12 ENCOUNTER — Ambulatory Visit (INDEPENDENT_AMBULATORY_CARE_PROVIDER_SITE_OTHER): Payer: 59 | Admitting: Internal Medicine

## 2015-01-12 VITALS — BP 136/90 | HR 82 | Temp 98.3°F | Ht 70.0 in | Wt 200.0 lb

## 2015-01-12 DIAGNOSIS — J310 Chronic rhinitis: Secondary | ICD-10-CM

## 2015-01-12 DIAGNOSIS — R002 Palpitations: Secondary | ICD-10-CM

## 2015-01-12 LAB — T4, FREE: Free T4: 0.89 ng/dL (ref 0.60–1.60)

## 2015-01-12 LAB — TSH: TSH: 2.65 u[IU]/mL (ref 0.35–4.50)

## 2015-01-12 MED ORDER — AMOXICILLIN 500 MG PO CAPS: 500.0000 mg | ORAL_CAPSULE | Freq: Three times a day (TID) | ORAL | Status: DC

## 2015-01-12 MED ORDER — TRAMADOL HCL 50 MG PO TABS: 50.0000 mg | ORAL_TABLET | Freq: Three times a day (TID) | ORAL | Status: DC | PRN

## 2015-01-12 NOTE — Patient Instructions (Addendum)
To prevent palpitations or premature beats, avoid stimulants such as decongestants, diet pills, nicotine, or caffeine (coffee, tea, cola, or chocolate) to excess.   Plain Mucinex (NOT D) for thick secretions ;force NON dairy fluids .   Nasal cleansing in the shower as discussed with lather of mild shampoo.After 10 seconds wash off lather while  exhaling through nostrils. Make sure that all residual soap is removed to prevent irritation.  Flonase OR Nasacort AQ 1 spray in each nostril twice a day as needed. Use the "crossover" technique into opposite nostril spraying toward opposite ear @ 45 degree angle, not straight up into nostril.  Plain Allegra (NOT D )  160 daily , Loratidine 10 mg , OR Zyrtec 10 mg @ bedtime  as needed for itchy eyes & sneezing.  Fill the  prescription for antibiotic if fever, discolored nasal or chest secretions or significant pain above & below eyes appear in the next 48-72 hours despite nasal hygiene as described above.   Your next office appointment will be determined based upon review of your pending labs  Those instructions will be transmitted to you through My Chart   Critical values will be called. Followup as needed for any active or acute issue. Please report any significant change in your symptoms.   Minimal Blood Pressure Goal= AVERAGE < 140/90;  Ideal is an AVERAGE < 135/85. This AVERAGE should be calculated from @ least 5-7 BP readings taken @ different times of day on different days of week. You should not respond to isolated BP readings , but rather the AVERAGE for that week .Please bring your  blood pressure cuff to office visits to verify that it is reliable.It  can also be checked against the blood pressure device at the pharmacy. Finger or wrist cuffs are not dependable; an arm cuff is.

## 2015-01-12 NOTE — Progress Notes (Signed)
   Subjective:    Patient ID: Tommy May, male    DOB: [birth year], 54 y.o.   MRN: [id number]  HPI He experienced palpitations for approximately 3 hours on 12/29/14. This was in the context of having worked two 14 hour [place] increased work stress 2/6 & 2/7 .  Additionally the evening before 2/8 he only slept 3 hours.  On 2/78 had 3-4 cups coffee and four-5 glasses of tea.  His continues smoke one half pack per day  With palpitations he had dizziness and lightheadedness.  In October 2015 calcium was slightly low at 8.3. Potassium was normal. Last thyroid function tests in May 2015 were normal.     Review of Systems  He had been prescribed Metroprolol in May 2015; he he did not fill this until 2/21.  He now has rhinitis symptoms with some intermittent yellow discharge.  He denies frontal headache, facial pain, fever, chills, or sweats.  He has been using Sudafed for this.     Objective:   Physical Exam  Pertinent or positive findings include: There is septal dislocation to the left and deviation to the right. There is marked erythema of the nasal mucosa and septum, especially on the right. He has a small boss over the left forehead  Appears healthy and well-nourished & in no acute distress No carotid bruits are present.No neck vein distention present at 10 - 15 degrees. Thyroid normal to palpation Heart rhythm and rate are normal with no gallop or murmur Chest is clear with no increased work of breathing There is no evidence of aortic aneurysm or renal artery bruits Abdomen soft with no organomegaly or masses. No HJR No clubbing, cyanosis or edema present. Pedal pulses are intact  No ischemic skin changes are present . Fingernails healthy  Alert and oriented but insight questionable in reference to multiple stimulant ingestion despite symptoms . Strength, tone, DTRs reflexes normal        Assessment & Plan:  #1 palpitations with multifactorial  components(smoking, caffeine, stress, sleep deprivation & now decongestants)  #2 nonallergic rhinitis  Plan: See orders and recommendations

## 2015-01-12 NOTE — Progress Notes (Signed)
Pre visit review using our clinic review tool, if applicable. No additional management support is needed unless otherwise documented below in the visit note. 

## 2015-01-13 LAB — BASIC METABOLIC PANEL
BUN: 10 mg/dL (ref 6–23)
CO2: 25 mEq/L (ref 19–32)
Calcium: 9.6 mg/dL (ref 8.4–10.5)
Chloride: 105 mEq/L (ref 96–112)
Creatinine, Ser: 0.82 mg/dL (ref 0.40–1.50)
GFR: 103.35 mL/min (ref >60.00)
Glucose, Bld: 100 mg/dL — ABNORMAL HIGH (ref 70–99)
Potassium: 4.9 mEq/L (ref 3.5–5.1)
Sodium: 141 mEq/L (ref 135–145)

## 2015-01-13 LAB — MAGNESIUM: Magnesium: 2.4 mg/dL (ref 1.5–2.5)

## 2015-01-19 ENCOUNTER — Ambulatory Visit: Payer: Self-pay | Admitting: Cardiovascular Disease

## 2015-02-09 ENCOUNTER — Other Ambulatory Visit: Payer: Self-pay | Admitting: Internal Medicine

## 2015-02-10 ENCOUNTER — Other Ambulatory Visit: Payer: Self-pay

## 2015-02-10 MED ORDER — TRAMADOL HCL 50 MG PO TABS: 50.0000 mg | ORAL_TABLET | Freq: Three times a day (TID) | ORAL | Status: DC | PRN

## 2015-02-10 NOTE — Telephone Encounter (Signed)
[facility] [phone]

## 2015-03-10 ENCOUNTER — Other Ambulatory Visit: Payer: Self-pay | Admitting: Internal Medicine

## 2015-03-10 MED ORDER — TRAMADOL HCL 50 MG PO TABS: 50.0000 mg | ORAL_TABLET | Freq: Three times a day (TID) | ORAL | Status: DC | PRN

## 2015-03-10 NOTE — Telephone Encounter (Signed)
[facility]

## 2015-04-05 ENCOUNTER — Other Ambulatory Visit: Payer: Self-pay | Admitting: Internal Medicine

## 2015-04-10 ENCOUNTER — Other Ambulatory Visit: Payer: Self-pay

## 2015-04-10 MED ORDER — GABAPENTIN 300 MG PO CAPS: ORAL_CAPSULE | ORAL | Status: DC

## 2015-05-12 ENCOUNTER — Other Ambulatory Visit: Payer: Self-pay | Admitting: Internal Medicine

## 2015-05-12 MED ORDER — TRAMADOL HCL 50 MG PO TABS: 50.0000 mg | ORAL_TABLET | Freq: Three times a day (TID) | ORAL | Status: DC | PRN

## 2015-05-12 NOTE — Addendum Note (Signed)
Addended by: Earnstine Regal on: 05/12/2015 02:36 PM   Modules accepted: Orders

## 2015-06-09 ENCOUNTER — Other Ambulatory Visit: Payer: Self-pay | Admitting: Internal Medicine

## 2015-06-10 ENCOUNTER — Other Ambulatory Visit: Payer: Self-pay

## 2015-06-10 MED ORDER — TRAMADOL HCL 50 MG PO TABS: 50.0000 mg | ORAL_TABLET | Freq: Three times a day (TID) | ORAL | Status: DC | PRN

## 2015-07-21 ENCOUNTER — Encounter: Payer: Self-pay | Admitting: Internal Medicine

## 2015-07-21 ENCOUNTER — Ambulatory Visit (INDEPENDENT_AMBULATORY_CARE_PROVIDER_SITE_OTHER): Payer: 59 | Admitting: Internal Medicine

## 2015-07-21 ENCOUNTER — Other Ambulatory Visit (INDEPENDENT_AMBULATORY_CARE_PROVIDER_SITE_OTHER): Payer: 59

## 2015-07-21 VITALS — BP 158/90 | HR 83 | Temp 98.1°F | Resp 16 | Wt 198.0 lb

## 2015-07-21 DIAGNOSIS — D751 Secondary polycythemia: Secondary | ICD-10-CM | POA: Diagnosis not present

## 2015-07-21 DIAGNOSIS — I10 Essential (primary) hypertension: Secondary | ICD-10-CM

## 2015-07-21 DIAGNOSIS — R7989 Other specified abnormal findings of blood chemistry: Secondary | ICD-10-CM

## 2015-07-21 DIAGNOSIS — H53133 Sudden visual loss, bilateral: Secondary | ICD-10-CM | POA: Diagnosis not present

## 2015-07-21 DIAGNOSIS — R0989 Other specified symptoms and signs involving the circulatory and respiratory systems: Secondary | ICD-10-CM

## 2015-07-21 DIAGNOSIS — H53139 Sudden visual loss, unspecified eye: Secondary | ICD-10-CM | POA: Insufficient documentation

## 2015-07-21 DIAGNOSIS — Z720 Tobacco use: Secondary | ICD-10-CM

## 2015-07-21 LAB — CBC WITH DIFFERENTIAL/PLATELET
Basophils Absolute: 0 10*3/uL (ref 0.0–0.1)
Basophils Relative: 0.3 % (ref 0.0–3.0)
Eosinophils Absolute: 0.1 10*3/uL (ref 0.0–0.7)
Eosinophils Relative: 1.3 % (ref 0.0–5.0)
HCT: 50.1 % (ref 39.0–52.0)
Hemoglobin: 17 g/dL (ref 13.0–17.0)
Lymphocytes Relative: 37.6 % (ref 12.0–46.0)
Lymphs Abs: 3 10*3/uL (ref 0.7–4.0)
MCHC: 33.9 g/dL (ref 30.0–36.0)
MCV: 89.5 fl (ref 78.0–100.0)
Monocytes Absolute: 0.8 10*3/uL (ref 0.1–1.0)
Monocytes Relative: 9.7 % (ref 3.0–12.0)
Neutro Abs: 4.1 10*3/uL (ref 1.4–7.7)
Neutrophils Relative %: 51.1 % (ref 43.0–77.0)
Platelets: 183 10*3/uL (ref 150.0–400.0)
RBC: 5.6 Mil/uL (ref 4.22–5.81)
RDW: 12.8 % (ref 11.5–15.5)
WBC: 8.1 10*3/uL (ref 4.0–10.5)

## 2015-07-21 LAB — LIPID PANEL
Cholesterol: 356 mg/dL — ABNORMAL HIGH (ref 0–200)
HDL: 36.9 mg/dL — ABNORMAL LOW (ref >39.00)
Total CHOL/HDL Ratio: 10
Triglycerides: 769 mg/dL — ABNORMAL HIGH (ref 0.0–149.0)

## 2015-07-21 LAB — BASIC METABOLIC PANEL
BUN: 11 mg/dL (ref 6–23)
CO2: 30 mEq/L (ref 19–32)
Calcium: 9.3 mg/dL (ref 8.4–10.5)
Chloride: 104 mEq/L (ref 96–112)
Creatinine, Ser: 0.76 mg/dL (ref 0.40–1.50)
GFR: 112.61 mL/min (ref >60.00)
Glucose, Bld: 99 mg/dL (ref 70–99)
Potassium: 4.7 mEq/L (ref 3.5–5.1)
Sodium: 139 mEq/L (ref 135–145)

## 2015-07-21 LAB — TSH: TSH: 2.6 u[IU]/mL (ref 0.35–4.50)

## 2015-07-21 LAB — HEMOGLOBIN A1C: Hgb A1c MFr Bld: 5.7 % (ref 4.6–6.5)

## 2015-07-21 MED ORDER — METOPROLOL TARTRATE 25 MG PO TABS: 25.0000 mg | ORAL_TABLET | Freq: Two times a day (BID) | ORAL | Status: DC

## 2015-07-21 NOTE — Progress Notes (Signed)
   Subjective:    Patient ID: Tommy May, male    DOB: [birth year], 54 y.o.   MRN: [id number]  HPI   He had 3 episodes of central vision loss in the past 24 hours. The first lasted 2-3 minutes and occurred between 9 and 10 AM 07/20/15. This was associated with loss of vision centrally described as a "grayish brown vacuum on the computer screen". He attempted to get up to get a drink and had dizziness and nausea.  The second episode occurred in mid afternoon 8/29 and lasted 1 minute. The third lasted 2 minutes and occurred at 9 am this morning. These episodes were associated with pressure in his eyes. He also described a dull headache to a level II-III. There was no cardiac or neurologic prodrome prior to the episodes.  He had no symptoms of an upper respiratory tract infection. He has had some clear nasal drainage.  Normally his blood pressures run in the 409W systolic.  He has a past history of secondary polycythemia; he also has a history of hyperglycemia . He has obstructive sleep apnea; he's been compliant with his CPAP.  He smokes three fourths pack per day. He quit taking baby aspirin 6 months ago.  There is a history of embolic stroke in his father as well as his paternal grandfather. His maternal grandfather had heart attack or stroke; he is unsure of which.  Review of Systems  Denied were any change in heart rhythm or rate prior to the events. There was no associated chest pain or shortness of breath .  Also specifically denied prior to the episodes were headache, limb weakness, tingling, or numbness. No seizure activity noted.     Objective:   Physical Exam Pertinent or positive findings include: There's a small boss of the left forehead.  Arteriolar narrowing is noted on fundal exam.  There is decreased range of motion of the cervical spine especially to the right.  He has intermittent grade 1/2 murmur at the right base. He has a faint right carotid bruit .  His  toenails are thickened.  The cranial nerve exam is negative. Rhomberg and finger to nose testing was negative. He can walk on his heels and toes.   General appearance :adequately nourished; in no distress.  Eyes: No conjunctival inflammation or scleral icterus is present.  Oral exam:  Lips and gums are healthy appearing.There is no oropharyngeal erythema or exudate noted. Dental hygiene is good.  Heart:  Normal rate and regular rhythm. S1 and S2 normal without gallop,   Lungs:Chest clear to auscultation; no wheezes, rhonchi,rales ,or rubs present.No increased work of breathing.   Abdomen: bowel sounds normal, soft and non-tender without masses, organomegaly or hernias noted.  No guarding or rebound.   Vascular : all pulses equal.  Skin:Warm & dry.  Intact without suspicious lesions or rashes ; no tenting or jaundice   Lymphatic: No lymphadenopathy is noted about the head, neck, axilla.   Neuro: Strength, tone & DTRs normal.         Assessment & Plan:  #1 recurrent central vision loss  #2 hypertension; repeat blood pressure was 158/90  #3 R carotid bruit  #4 secondary polycythemia  #5 smoker  Plan: See orders recommendations

## 2015-07-21 NOTE — Progress Notes (Signed)
Pre visit review using our clinic review tool, if applicable. No additional management support is needed unless otherwise documented below in the visit note. 

## 2015-07-21 NOTE — Patient Instructions (Signed)
Minimal Blood Pressure Goal= AVERAGE < 140/90;  Ideal is an AVERAGE < 135/85. This AVERAGE should be calculated from @ least 5-7 BP readings taken @ different times of day on different days of week. You should not respond to isolated BP readings , but rather the AVERAGE for that week .Please bring your  blood pressure cuff to office visits to verify that it is reliable.It  can also be checked against the blood pressure device at the pharmacy. Finger or wrist cuffs are not dependable; an arm cuff is.  Please take enteric-coated aspirin 325 mg daily with breakfast.

## 2015-07-22 ENCOUNTER — Other Ambulatory Visit: Payer: Self-pay | Admitting: Internal Medicine

## 2015-07-22 DIAGNOSIS — E782 Mixed hyperlipidemia: Secondary | ICD-10-CM

## 2015-07-22 LAB — LDL CHOLESTEROL, DIRECT: Direct LDL: 171 mg/dL

## 2015-07-22 MED ORDER — ATORVASTATIN CALCIUM 20 MG PO TABS: 20.0000 mg | ORAL_TABLET | Freq: Every day | ORAL | Status: DC

## 2015-07-31 ENCOUNTER — Ambulatory Visit (HOSPITAL_COMMUNITY): Payer: 59 | Attending: Cardiovascular Disease

## 2015-07-31 ENCOUNTER — Other Ambulatory Visit: Payer: Self-pay

## 2015-07-31 DIAGNOSIS — G459 Transient cerebral ischemic attack, unspecified: Secondary | ICD-10-CM | POA: Insufficient documentation

## 2015-07-31 DIAGNOSIS — Z8249 Family history of ischemic heart disease and other diseases of the circulatory system: Secondary | ICD-10-CM | POA: Diagnosis not present

## 2015-07-31 DIAGNOSIS — H53133 Sudden visual loss, bilateral: Secondary | ICD-10-CM

## 2015-07-31 DIAGNOSIS — F17200 Nicotine dependence, unspecified, uncomplicated: Secondary | ICD-10-CM | POA: Insufficient documentation

## 2015-07-31 DIAGNOSIS — I10 Essential (primary) hypertension: Secondary | ICD-10-CM | POA: Insufficient documentation

## 2015-08-03 ENCOUNTER — Other Ambulatory Visit: Payer: Self-pay | Admitting: Internal Medicine

## 2015-08-03 DIAGNOSIS — R0989 Other specified symptoms and signs involving the circulatory and respiratory systems: Secondary | ICD-10-CM

## 2015-08-03 DIAGNOSIS — H53133 Sudden visual loss, bilateral: Secondary | ICD-10-CM

## 2015-08-03 DIAGNOSIS — Z720 Tobacco use: Secondary | ICD-10-CM

## 2015-08-03 NOTE — Telephone Encounter (Signed)
Please advise, thanks.

## 2015-08-04 ENCOUNTER — Other Ambulatory Visit: Payer: Self-pay | Admitting: Emergency Medicine

## 2015-08-04 MED ORDER — TRAMADOL HCL 50 MG PO TABS: 50.0000 mg | ORAL_TABLET | Freq: Three times a day (TID) | ORAL | Status: DC | PRN

## 2015-08-04 NOTE — Telephone Encounter (Signed)
OK x 1 

## 2015-08-04 NOTE — Telephone Encounter (Signed)
Tramadol faxed to local pharm

## 2015-08-18 ENCOUNTER — Other Ambulatory Visit: Payer: Self-pay | Admitting: Internal Medicine

## 2015-08-19 ENCOUNTER — Other Ambulatory Visit: Payer: Self-pay | Admitting: Emergency Medicine

## 2015-08-19 DIAGNOSIS — E782 Mixed hyperlipidemia: Secondary | ICD-10-CM

## 2015-08-19 MED ORDER — ATORVASTATIN CALCIUM 20 MG PO TABS: 20.0000 mg | ORAL_TABLET | Freq: Every day | ORAL | Status: DC

## 2015-08-31 ENCOUNTER — Other Ambulatory Visit: Payer: Self-pay | Admitting: Internal Medicine

## 2015-08-31 ENCOUNTER — Telehealth: Payer: Self-pay | Admitting: *Deleted

## 2015-08-31 MED ORDER — TRAMADOL HCL 50 MG PO TABS: 50.0000 mg | ORAL_TABLET | Freq: Three times a day (TID) | ORAL | Status: DC | PRN

## 2015-08-31 NOTE — Telephone Encounter (Signed)
[facility].../lmb 

## 2015-08-31 NOTE — Telephone Encounter (Signed)
-----   Message from Hendricks Limes, MD sent at 08/31/2015 12:46 PM EDT ----- Tramadol OK X1

## 2015-09-21 ENCOUNTER — Encounter: Payer: Self-pay | Admitting: Neurology

## 2015-09-21 ENCOUNTER — Ambulatory Visit (INDEPENDENT_AMBULATORY_CARE_PROVIDER_SITE_OTHER): Payer: 59 | Admitting: Neurology

## 2015-09-21 VITALS — BP 146/78 | HR 74 | Ht 70.0 in | Wt 200.9 lb

## 2015-09-21 DIAGNOSIS — R200 Anesthesia of skin: Secondary | ICD-10-CM

## 2015-09-21 DIAGNOSIS — I739 Peripheral vascular disease, unspecified: Secondary | ICD-10-CM

## 2015-09-21 DIAGNOSIS — IMO0001 Reserved for inherently not codable concepts without codable children: Secondary | ICD-10-CM

## 2015-09-21 DIAGNOSIS — R208 Other disturbances of skin sensation: Secondary | ICD-10-CM | POA: Diagnosis not present

## 2015-09-21 DIAGNOSIS — G453 Amaurosis fugax: Secondary | ICD-10-CM | POA: Diagnosis not present

## 2015-09-21 DIAGNOSIS — R030 Elevated blood-pressure reading, without diagnosis of hypertension: Secondary | ICD-10-CM

## 2015-09-21 DIAGNOSIS — I779 Disorder of arteries and arterioles, unspecified: Secondary | ICD-10-CM

## 2015-09-21 DIAGNOSIS — E785 Hyperlipidemia, unspecified: Secondary | ICD-10-CM | POA: Diagnosis not present

## 2015-09-21 NOTE — Progress Notes (Signed)
NEUROLOGY CONSULTATION NOTE  Tommy May MRN: [id number] DOB: [birth year]  Referring provider: Dr. Linna Darner  Primary care provider: Dr. Linna Darner  Reason for consult:  Vision loss  HISTORY OF PRESENT ILLNESS: Tommy May is a 54 year old male with hypertension, tobacco abuse, OSA and secondary polycythemia who presents for episodic vision loss.  History obtained by patient and PCP note.  Labs, 2D echo and carotid doppler reports reviewed.  In late August, he had a 3 or 4 day span where he experienced brief episodes of vision loss, lasting several minutes.  He described gray central vision loss.  He was able to see the periphery.  He never closed either eye.  He noted lightheadedness.  There was no associated headache, or focal numbness or weakness.  They would usually occur while He was advised to restart ASA 81mg  daily , which he had quit 6 months prior.  He did not restart the ASA. 2D echocardiogram showed EF 55-60%.  Lipid panel showed cholesterol of 356, TG 769, HDL 36.90 and direct LDL of 171.  Hgb A1c was 5.7.  TSH was 2.60.  CBC and BMP were normal.  He was started on Lipitor 20mg  daily.  He saw the ophthalmologist and had an unremarkable exam.  He did have a carotid doppler performed on 07/24/14, which showed 60-79% bilateral ICA stenosis.  He also has longstanding history of cervical disc disease.  More recently, he experiences tightness in the left trapezius, associated with a tingling sensation in his left arm up to the 4th and 5th digits.  PAST MEDICAL HISTORY: Past Medical History  Diagnosis Date  . Sleep apnea     wears CPAP nightly  . Hypertension     does not take meds  . Heart palpitations   . History of Holter monitoring   . Environmental and seasonal allergies   . GERD (gastroesophageal reflux disease)   . Nerve pain     neck; takes gabapentin    PAST SURGICAL HISTORY: Past Surgical History  Procedure Laterality Date  . Rotator cuff repair Left   . Dental  implants    . Inguinal hernia repair Left 08/25/2014    Procedure: LEFT INGUINAL HERNIA REPAIR REMOVAL SPERMATIC CORD MASS;  Surgeon: Jackolyn Confer, MD;  Location: [facility];  Service: General;  Laterality: Left;  . Insertion of mesh N/A 08/25/2014    Procedure: INSERTION OF MESH;  Surgeon: Jackolyn Confer, MD;  Location: [facility];  Service: General;  Laterality: N/A;    MEDICATIONS: Current Outpatient Prescriptions on File Prior to Visit  Medication Sig Dispense Refill  . acetaminophen (TYLENOL) 500 MG tablet Take 500 mg by mouth every 8 (eight) hours as needed for moderate pain.    Marland Kitchen atorvastatin (LIPITOR) 20 MG tablet Take 1 tablet (20 mg total) by mouth daily. Check fasting labs after 8-10 weeks 90 tablet 0  . esomeprazole (NEXIUM) 40 MG capsule Take 40 mg by mouth daily.     Marland Kitchen gabapentin (NEURONTIN) 300 MG capsule Take 1 capsule by mouth  every night at bedtime 90 capsule 0  . loratadine (CLARITIN) 10 MG tablet Take 10 mg by mouth daily as needed for allergies.    . metoprolol tartrate (LOPRESSOR) 25 MG tablet Take 1 tablet (25 mg total) by mouth 2 (two) times daily. 60 tablet 2  . Multiple Vitamin (MULTIVITAMIN WITH MINERALS) TABS tablet Take 1 tablet by mouth daily.    . traMADol (ULTRAM) 50 MG tablet Take 1 tablet (50 mg total)  by mouth every 8 (eight) hours as needed for moderate pain. 30 tablet 0   No current facility-administered medications on file prior to visit.    ALLERGIES: No Known Allergies  FAMILY HISTORY: Family History  Problem Relation Age of Onset  . Stroke Father 13  . Diabetes Father   . Diabetes Paternal Grandmother   . Stroke Paternal Grandfather 49    guess early 60's    SOCIAL HISTORY: Social History   Social History  . Marital Status: Married    Spouse Name: N/A  . Number of Children: 2  . Years of Education: N/A   Occupational History  . purchasing    Social History Main Topics  . Smoking status: Current Every Day Smoker -- 0.75 packs/day for 43  years    Types: Cigarettes  . Smokeless tobacco: Not on file  . Alcohol Use: No  . Drug Use: No  . Sexual Activity: Not on file   Other Topics Concern  . Not on file   Social History Narrative   Pt lives with his wife and two children. Does have stairs into basement, doesn't have an issue with them. Graduated high school.    REVIEW OF SYSTEMS: Constitutional: No fevers, chills, or sweats, no generalized fatigue, change in appetite Eyes: No visual changes, double vision, eye pain Ear, nose and throat: No hearing loss, ear pain, nasal congestion, sore throat Cardiovascular: No chest pain, palpitations Respiratory:  No shortness of breath at rest or with exertion, wheezes GastrointestinaI: No nausea, vomiting, diarrhea, abdominal pain, fecal incontinence Genitourinary:  No dysuria, urinary retention or frequency Musculoskeletal:  No neck pain, back pain Integumentary: No rash, pruritus, skin lesions Neurological: as above Psychiatric: No depression, insomnia, anxiety Endocrine: No palpitations, fatigue, diaphoresis, mood swings, change in appetite, change in weight, increased thirst Hematologic/Lymphatic:  No anemia, purpura, petechiae. Allergic/Immunologic: no itchy/runny eyes, nasal congestion, recent allergic reactions, rashes  PHYSICAL EXAM: Filed Vitals:   09/21/15 1412  BP: 146/78  Pulse: 74   General: No acute distress.  Patient appears well-groomed.  Head:  Normocephalic/atraumatic Eyes:  fundi unremarkable, without vessel changes, exudates, hemorrhages or papilledema. Neck: supple, no paraspinal tenderness, full range of motion Back: No paraspinal tenderness Heart: regular rate and rhythm Lungs: Clear to auscultation bilaterally. Vascular: No carotid bruits. Neurological Exam: Mental status: alert and oriented to person, place, and time, recent and remote memory intact, fund of knowledge intact, attention and concentration intact, speech fluent and not dysarthric,  language intact. Cranial nerves: CN I: not tested CN II: pupils equal, round and reactive to light, visual fields intact, fundi unremarkable, without vessel changes, exudates, hemorrhages or papilledema. CN III, IV, VI:  full range of motion, no nystagmus, no ptosis CN V: facial sensation intact CN VII: upper and lower face symmetric CN VIII: hearing intact CN IX, X: gag intact, uvula midline CN XI: sternocleidomastoid and trapezius muscles intact CN XII: tongue midline Bulk & Tone: normal, no fasciculations. Motor:  5/5 throughout  Sensation:  Reduced pinprick in 4th and 5th digits of left hand.   Vibration intact. Deep Tendon Reflexes:  2+ throughout, toes downgoing.  Finger to nose testing:  Without dysmetria.  Heel to shin:  Without dysmetria.  Gait:  Normal station and stride.  Able to turn and tandem walk. Romberg negative.  IMPRESSION: 1. Questionable amaurosis fugax.  He never closed either eye during event, so we don't know if it was actually monocular. 2.  He does have bilateral carotid artery disease.  It may be symptomatic, however it is unknown which is the symptomatic side since each eye was not tested separately during the episodes. 3.  Tobacco abuse 4.  Hyperlipidemia 5.  Elevated blood pressure 6.  Left arm numbness.  Consider cervical radiculopathy versus ulnar neuropathy  PLAN: 1.  Start ASA 81mg  daily 2.  Lipitor 20mg  daily (LDL goal should be less than 70) 3.  Check MRI of brain 4.  Check CTA of neck 5.  Smoking cessation discussed 6.  Have BP rechecked with PCP 7.  NCV-EMG of left upper extremity 8.  Follow up in 3 months.  Have fasting lipid panel rechecked at that time.  Thank you for allowing me to take part in the care of this patient.  Metta Clines, DO  CC:  Unice Cobble, MD

## 2015-09-21 NOTE — Addendum Note (Signed)
Addended by: Gerda Diss A on: 09/21/2015 03:23 PM   Modules accepted: Orders

## 2015-09-21 NOTE — Patient Instructions (Addendum)
Start aspirin 81mg  daily Continue Lipitor 20mg  daily.  LDL goal should be less than 70. Try to quit smoking Will get MRI of brain Will get CTA of neck Will get nerve conduction study of left upper extremity Follow up in 3 months.  Recheck fasting lipid panel at that time.

## 2015-09-28 ENCOUNTER — Other Ambulatory Visit: Payer: Self-pay | Admitting: Internal Medicine

## 2015-09-28 MED ORDER — TRAMADOL HCL 50 MG PO TABS: 50.0000 mg | ORAL_TABLET | Freq: Three times a day (TID) | ORAL | Status: DC | PRN

## 2015-09-28 NOTE — Telephone Encounter (Signed)
Medication refilled in current PCP absence.

## 2015-09-28 NOTE — Addendum Note (Signed)
Addended by: Mauricio Po D on: 09/28/2015 12:55 PM   Modules accepted: Orders

## 2015-10-02 ENCOUNTER — Ambulatory Visit
Admission: RE | Admit: 2015-10-02 | Discharge: 2015-10-02 | Disposition: A | Discharge status: Home / Self Care | Payer: 59 | Source: Ambulatory Visit | Attending: Neurology | Admitting: Neurology

## 2015-10-02 DIAGNOSIS — I779 Disorder of arteries and arterioles, unspecified: Secondary | ICD-10-CM

## 2015-10-02 DIAGNOSIS — I739 Peripheral vascular disease, unspecified: Principal | ICD-10-CM

## 2015-10-02 DIAGNOSIS — G453 Amaurosis fugax: Secondary | ICD-10-CM

## 2015-10-02 MED ORDER — IOPAMIDOL (ISOVUE-370) INJECTION 76%
80.0000 mL | Freq: Once | INTRAVENOUS | Status: AC | PRN
  Administered 2015-10-02: 80 mL via INTRAVENOUS

## 2015-10-03 ENCOUNTER — Other Ambulatory Visit: Payer: Self-pay | Admitting: Internal Medicine

## 2015-10-05 ENCOUNTER — Telehealth: Payer: Self-pay

## 2015-10-05 ENCOUNTER — Other Ambulatory Visit: Payer: Self-pay | Admitting: Internal Medicine

## 2015-10-05 NOTE — Telephone Encounter (Signed)
Message relayed to patient. Verbalized understanding and denied questions.   

## 2015-10-05 NOTE — Telephone Encounter (Signed)
-----   Message from Pieter Partridge, DO sent at 10/02/2015  1:52 PM EST ----- MRI and CT look okay

## 2015-10-06 ENCOUNTER — Other Ambulatory Visit: Payer: Self-pay | Admitting: Emergency Medicine

## 2015-10-06 NOTE — Telephone Encounter (Signed)
OK X 3 mos 

## 2015-10-06 NOTE — Telephone Encounter (Signed)
Ok to refill 

## 2015-10-13 ENCOUNTER — Ambulatory Visit (INDEPENDENT_AMBULATORY_CARE_PROVIDER_SITE_OTHER): Payer: 59 | Admitting: Neurology

## 2015-10-13 DIAGNOSIS — R208 Other disturbances of skin sensation: Secondary | ICD-10-CM

## 2015-10-13 DIAGNOSIS — R200 Anesthesia of skin: Secondary | ICD-10-CM

## 2015-10-13 NOTE — Procedures (Signed)
[facility]  [address]  [place], [place] [address] Tel: [phone] Fax:  [phone] Test Date:  10/13/2015  Patient: Tommy May DOB: 05-20-1961 Physician: Narda Amber  Sex: Male Height: 5\' 10"  Ref Phys: Metta Clines, D.O.  ID#: [id number] Temp: 32.4 Technician: M. Dean   Patient Complaints: This is a 54 year old gentleman referred for evaluation of neck pain radiating into his left arm.  NCV & EMG Findings: Extensive electrodiagnostic testing of the left upper extremity shows: 1. Left median, ulnar, and palmar sensory responses are within normal limits. 2. Left median and ulnar motor responses are within normal limits. 3. There is no evidence of active or chronic motor axon loss changes affecting any of the tested muscles. Motor unit configuration and recruitment pattern is within normal limits.  Impression: This is a normal study of the left upper extremity. In particular, there is no evidence of a cervical radiculopathy or entrapment neuropathy.   _____________________________ Narda Amber, D.O.    Nerve Conduction Studies Anti Sensory Summary Table   Stim Site NR Peak (ms) Norm Peak (ms) P-T Amp (V) Norm P-T Amp  Left Median Anti Sensory (2nd Digit)  32.4C  Wrist    3.6 <3.6 23.1 >15  Left Ulnar Anti Sensory (5th Digit)  32.4C  Wrist    2.8 <3.1 20.3 >10   Motor Summary Table   Stim Site NR Onset (ms) Norm Onset (ms) O-P Amp (mV) Norm O-P Amp Site1 Site2 Delta-0 (ms) Dist (cm) Vel (m/s) Norm Vel (m/s)  Left Median Motor (Abd Poll Brev)  32.4C  Wrist    3.7 <4.0 9.8 >6 Elbow Wrist 4.5 24.0 53 >50  Elbow    8.2  9.7         Left Ulnar Motor (Abd Dig Minimi)  32.4C  Wrist    2.5 <3.1 8.6 >7 B Elbow Wrist 3.8 20.0 53 >50  B Elbow    6.3  8.2  A Elbow B Elbow 1.7 10.0 59 >50  A Elbow    8.0  8.2          Comparison Summary Table   Stim Site NR Peak (ms) Norm Peak (ms) P-T Amp (V) Site1 Site2 Delta-P (ms) Norm Delta (ms)  Left  Median/Ulnar Palm Comparison (Wrist - 8cm)  32.4C  Median Palm    2.0 <2.2 18.9 Median Palm Ulnar Palm 0.0   Ulnar Palm    2.0 <2.2 30.8       EMG   Side Muscle Ins Act Fibs Psw Fasc Number Recrt Dur Dur. Amp Amp. Poly Poly. Comment  Left 1stDorInt Nml Nml Nml Nml Nml Nml Nml Nml Nml Nml Nml Nml N/A  Left Ext Indicis Nml Nml Nml Nml Nml Nml Nml Nml Nml Nml Nml Nml N/A  Left PronatorTeres Nml Nml Nml Nml Nml Nml Nml Nml Nml Nml Nml Nml N/A  Left Biceps Nml Nml Nml Nml Nml Nml Nml Nml Nml Nml Nml Nml N/A  Left Triceps Nml Nml Nml Nml Nml Nml Nml Nml Nml Nml Nml Nml N/A  Left Deltoid Nml Nml Nml Nml Nml Nml Nml Nml Nml Nml Nml Nml N/A      Waveforms:

## 2015-10-20 ENCOUNTER — Telehealth: Payer: Self-pay

## 2015-10-20 NOTE — Telephone Encounter (Signed)
-----   Message from Pieter Partridge, DO sent at 10/20/2015  7:30 AM EST ----- The nerve study was normal.  It did not reveal any evidence of pinched nerve in the neck or in the arm.  A pinched nerve in the neck is still possible.  If we would like, we can order MRI of the cervical spine without contrast.  However, he only reports numbness.  Unless there is also pain and weakness, which he did not have, it wouldn't change management.

## 2015-10-20 NOTE — Telephone Encounter (Signed)
Message relayed to patient. Verbalized understanding and denied questions. Would like to hold off on MRI at this time.

## 2015-10-21 ENCOUNTER — Other Ambulatory Visit: Payer: Self-pay

## 2015-10-21 DIAGNOSIS — I10 Essential (primary) hypertension: Secondary | ICD-10-CM

## 2015-10-21 MED ORDER — METOPROLOL TARTRATE 25 MG PO TABS: 25.0000 mg | ORAL_TABLET | Freq: Two times a day (BID) | ORAL | Status: DC

## 2015-10-26 ENCOUNTER — Other Ambulatory Visit: Payer: Self-pay | Admitting: Family

## 2015-10-26 MED ORDER — TRAMADOL HCL 50 MG PO TABS: 50.0000 mg | ORAL_TABLET | Freq: Three times a day (TID) | ORAL | Status: DC | PRN

## 2015-10-26 NOTE — Addendum Note (Signed)
Addended by: Mauricio Po D on: 10/26/2015 01:19 PM   Modules accepted: Orders

## 2015-11-23 ENCOUNTER — Other Ambulatory Visit: Payer: Self-pay | Admitting: Family

## 2015-11-24 MED ORDER — TRAMADOL HCL 50 MG PO TABS: 50.0000 mg | ORAL_TABLET | Freq: Three times a day (TID) | ORAL | Status: DC | PRN

## 2015-11-24 NOTE — Addendum Note (Signed)
Addended by: Mauricio Po D on: 11/24/2015 09:45 PM   Modules accepted: Orders

## 2015-12-21 ENCOUNTER — Other Ambulatory Visit: Payer: Self-pay | Admitting: Family

## 2015-12-21 MED ORDER — TRAMADOL HCL 50 MG PO TABS: 50.0000 mg | ORAL_TABLET | Freq: Three times a day (TID) | ORAL | Status: DC | PRN

## 2015-12-31 ENCOUNTER — Other Ambulatory Visit: Payer: Self-pay | Admitting: Internal Medicine

## 2016-01-01 ENCOUNTER — Other Ambulatory Visit: Payer: Self-pay | Admitting: Internal Medicine

## 2016-01-04 NOTE — Telephone Encounter (Signed)
Left message advising patient he needs to call back to make appt to get established with new pcp---let tamara know when patient calls back and decides on new pcp---i can send in request for gabepentin to patient pharm after appt is made

## 2016-01-13 ENCOUNTER — Ambulatory Visit (INDEPENDENT_AMBULATORY_CARE_PROVIDER_SITE_OTHER): Payer: 59 | Admitting: Family

## 2016-01-13 ENCOUNTER — Encounter: Payer: Self-pay | Admitting: Family

## 2016-01-13 VITALS — BP 150/82 | HR 80 | Temp 98.3°F | Resp 16 | Ht 70.0 in | Wt 202.0 lb

## 2016-01-13 DIAGNOSIS — Z23 Encounter for immunization: Secondary | ICD-10-CM

## 2016-01-13 DIAGNOSIS — I10 Essential (primary) hypertension: Secondary | ICD-10-CM | POA: Diagnosis not present

## 2016-01-13 DIAGNOSIS — Z0001 Encounter for general adult medical examination with abnormal findings: Secondary | ICD-10-CM | POA: Insufficient documentation

## 2016-01-13 DIAGNOSIS — Z0000 Encounter for general adult medical examination without abnormal findings: Secondary | ICD-10-CM

## 2016-01-13 MED ORDER — GABAPENTIN 300 MG PO CAPS: ORAL_CAPSULE | ORAL | Status: DC

## 2016-01-13 MED ORDER — TRAMADOL HCL 50 MG PO TABS: 50.0000 mg | ORAL_TABLET | Freq: Two times a day (BID) | ORAL | Status: DC | PRN

## 2016-01-13 NOTE — Assessment & Plan Note (Signed)
1) Anticipatory Guidance: Discussed importance of wearing a seatbelt while driving and not texting while driving; changing batteries in smoke detector at least once annually; wearing suntan lotion when outside; eating a balanced and moderate diet; getting physical activity at least 30 minutes per day.  2) Immunizations / Screenings / Labs:  Tetanus updated today. Declines influenza. All other immunizations are up-to-date per recommendations. Due for a vision exam encouraged to be completed independently. Due for colon cancer screening. Declines colonoscopy. Agrees to [employer] with paperwork completed. Declines hepatitis C screen. All other screenings are up-to-date per recommendations. Obtain CBC, CMET, Lipid profile and TSH.   Overall well exam with risk factors for cardiovascular disease including hypertension, tobacco use, and hyperlipidemia. Hypertension currently managed with medication. Hyperlipidemia remains uncontrolled and not on medication secondary to muscle pains experience while on atorvastatin. Atorvastatin place and allergy list for intolerance to statins. Obtain lipid profile to determine current status. Consider alternative medications pending blood work. Discussed importance of quitting tobacco use to reduce the risk of cardiovascular and respiratory disease in the future. Patient declines cessation information at this time. We'll continue to monitor and work on decreasing tobacco usage. Discussed importance of eating a nutrient dense diet that is low in saturated fats and processed/sugary foods. Encouraged increased physical activity to goal of 30 minutes of moderate level activity daily. Continue other healthy lifestyle behaviors and choices. Follow-up prevention exam in 1 year. Up office visit for chronic conditions pending blood work.

## 2016-01-13 NOTE — Patient Instructions (Signed)
Thank you for choosing [facility].  Summary/Instructions:  Your prescription(s) have been submitted to your pharmacy or been printed and provided for you. Please take as directed and contact our office if you believe you are having problem(s) with the medication(s) or have any questions.  Please stop by the lab on the basement level of the building for your blood work. Your results will be released to [place] (or called to you) after review, usually within 72 hours after test completion. If any changes need to be made, you will be notified at that same time.  If your symptoms worsen or fail to improve, please contact our office for further instruction, or in case of emergency go directly to the emergency room at the closest medical facility.    Health Maintenance, Male A healthy lifestyle and preventative care can promote health and wellness.  Maintain regular health, dental, and eye exams.  Eat a healthy diet. Foods like vegetables, fruits, whole grains, low-fat dairy products, and lean protein foods contain the nutrients you need and are low in calories. Decrease your intake of foods high in solid fats, added sugars, and salt. Get information about a proper diet from your health care provider, if necessary.  Regular physical exercise is one of the most important things you can do for your health. Most adults should get at least 150 minutes of moderate-intensity exercise (any activity that increases your heart rate and causes you to sweat) each week. In addition, most adults need muscle-strengthening exercises on 2 or more days a week.   Maintain a healthy weight. The body mass index (BMI) is a screening tool to identify possible weight problems. It provides an estimate of body fat based on height and weight. Your health care provider can find your BMI and can help you achieve or maintain a healthy weight. For males 20 years and older:  A BMI below 18.5 is considered underweight.  A  BMI of 18.5 to 24.9 is normal.  A BMI of 25 to 29.9 is considered overweight.  A BMI of 30 and above is considered obese.  Maintain normal blood lipids and cholesterol by exercising and minimizing your intake of saturated fat. Eat a balanced diet with plenty of fruits and vegetables. Blood tests for lipids and cholesterol should begin at age 66 and be repeated every 5 years. If your lipid or cholesterol levels are high, you are over age 42, or you are at high risk for heart disease, you may need your cholesterol levels checked more frequently.Ongoing high lipid and cholesterol levels should be treated with medicines if diet and exercise are not working.  If you smoke, find out from your health care provider how to quit. If you do not use tobacco, do not start.  Lung cancer screening is recommended for adults aged 61-80 years who are at high risk for developing lung cancer because of a history of smoking. A yearly low-dose CT scan of the lungs is recommended for people who have at least a 30-pack-year history of smoking and are current smokers or have quit within the past 15 years. A pack year of smoking is smoking an average of 1 pack of cigarettes a day for 1 year (for example, a 30-pack-year history of smoking could mean smoking 1 pack a day for 30 years or 2 packs a day for 15 years). Yearly screening should continue until the smoker has stopped smoking for at least 15 years. Yearly screening should be stopped for people who develop  a health problem that would prevent them from having lung cancer treatment.  If you choose to drink alcohol, do not have more than 2 drinks per day. One drink is considered to be 12 oz (360 mL) of beer, 5 oz (150 mL) of wine, or 1.5 oz (45 mL) of liquor.  Avoid the use of street drugs. Do not share needles with anyone. Ask for help if you need support or instructions about stopping the use of drugs.  High blood pressure causes heart disease and increases the risk of  stroke. High blood pressure is more likely to develop in:  People who have blood pressure in the end of the normal range (100-139/85-89 mm Hg).  People who are overweight or obese.  People who are African American.  If you are 53-18 years of age, have your blood pressure checked every 3-5 years. If you are 39 years of age or older, have your blood pressure checked every year. You should have your blood pressure measured twice--once when you are at a hospital or clinic, and once when you are not at a hospital or clinic. Record the average of the two measurements. To check your blood pressure when you are not at a hospital or clinic, you can use:  An automated blood pressure machine at a pharmacy.  A home blood pressure monitor.  If you are 11-7 years old, ask your health care provider if you should take aspirin to prevent heart disease.  Diabetes screening involves taking a blood sample to check your fasting blood sugar level. This should be done once every 3 years after age 65 if you are at a normal weight and without risk factors for diabetes. Testing should be considered at a younger age or be carried out more frequently if you are overweight and have at least 1 risk factor for diabetes.  Colorectal cancer can be detected and often prevented. Most routine colorectal cancer screening begins at the age of 5 and continues through age 32. However, your health care provider may recommend screening at an earlier age if you have risk factors for colon cancer. On a yearly basis, your health care provider may provide home test kits to check for hidden blood in the stool. A small camera at the end of a tube may be used to directly examine the colon (sigmoidoscopy or colonoscopy) to detect the earliest forms of colorectal cancer. Talk to your health care provider about this at age 70 when routine screening begins. A direct exam of the colon should be repeated every 5-10 years through age 37, unless early  forms of precancerous polyps or small growths are found.  People who are at an increased risk for hepatitis B should be screened for this virus. You are considered at high risk for hepatitis B if:  You were born in a country where hepatitis B occurs often. Talk with your health care provider about which countries are considered high risk.  Your parents were born in a high-risk country and you have not received a shot to protect against hepatitis B (hepatitis B vaccine).  You have HIV or AIDS.  You use needles to inject street drugs.  You live with, or have sex with, someone who has hepatitis B.  You are a man who has sex with other men (MSM).  You get hemodialysis treatment.  You take certain medicines for conditions like cancer, organ transplantation, and autoimmune conditions.  Hepatitis C blood testing is recommended for all people born from  1945 through 1965 and any individual with known risk factors for hepatitis C.  Healthy men should no longer receive prostate-specific antigen (PSA) blood tests as part of routine cancer screening. Talk to your health care provider about prostate cancer screening.  Testicular cancer screening is not recommended for adolescents or adult males who have no symptoms. Screening includes self-exam, a health care provider exam, and other screening tests. Consult with your health care provider about any symptoms you have or any concerns you have about testicular cancer.  Practice safe sex. Use condoms and avoid high-risk sexual practices to reduce the spread of sexually transmitted infections (STIs).  You should be screened for STIs, including gonorrhea and chlamydia if:  You are sexually active and are younger than 24 years.  You are older than 24 years, and your health care provider tells you that you are at risk for this type of infection.  Your sexual activity has changed since you were last screened, and you are at an increased risk for chlamydia  or gonorrhea. Ask your health care provider if you are at risk.  If you are at risk of being infected with HIV, it is recommended that you take a prescription medicine daily to prevent HIV infection. This is called pre-exposure prophylaxis (PrEP). You are considered at risk if:  You are a man who has sex with other men (MSM).  You are a heterosexual man who is sexually active with multiple partners.  You take drugs by injection.  You are sexually active with a partner who has HIV.  Talk with your health care provider about whether you are at high risk of being infected with HIV. If you choose to begin PrEP, you should first be tested for HIV. You should then be tested every 3 months for as long as you are taking PrEP.  Use sunscreen. Apply sunscreen liberally and repeatedly throughout the day. You should seek shade when your shadow is shorter than you. Protect yourself by wearing long sleeves, pants, a wide-brimmed hat, and sunglasses year round whenever you are outdoors.  Tell your health care provider of new moles or changes in moles, especially if there is a change in shape or color. Also, tell your health care provider if a mole is larger than the size of a pencil eraser.  A one-time screening for abdominal aortic aneurysm (AAA) and surgical repair of large AAAs by ultrasound is recommended for men aged 59-75 years who are current or former smokers.  Stay current with your vaccines (immunizations).   This information is not intended to replace advice given to you by your health care provider. Make sure you discuss any questions you have with your health care provider.   Document Released: 05/05/2008 Document Revised: 11/28/2014 Document Reviewed: 04/04/2011 Elsevier Interactive Patient Education [employer].

## 2016-01-13 NOTE — Progress Notes (Signed)
Subjective:    Patient ID: Tommy May, male    DOB: 1961-06-03, 55 y.o.   MRN: [id number]  Chief Complaint  Patient presents with  . Establish Care    medication refill, CPE     HPI:  Tommy May is a 55 y.o. male who presents today for an annual wellness visit.   1) Health Maintenance -   Diet - Averages about 2 meals consisting of mostly fast food. Caffeine intake 7-8 cups of caffeine daily.  Exercise - No structured exercise; Walking and lifting at work   2) Preventative Exams / Immunizations:  Dental -- Up to date  Vision -- Due for exam.    Health Maintenance  Topic Date Due  . Hepatitis C Screening  August 29, 1961  . HIV Screening  03/08/1974  . TETANUS/TDAP  03/08/1978  . COLONOSCOPY  03/08/2009  . INFLUENZA VACCINE  08/05/2016 (Originally 06/22/2015)    Immunization History  Administered Date(s) Administered  . Td 01/13/2016    Allergies  Allergen Reactions  . Atorvastatin     Muscle pain     Outpatient Prescriptions Prior to Visit  Medication Sig Dispense Refill  . acetaminophen (TYLENOL) 500 MG tablet Take 500 mg by mouth every 8 (eight) hours as needed for moderate pain.    Marland Kitchen esomeprazole (NEXIUM) 40 MG capsule Take 40 mg by mouth daily.     Marland Kitchen loratadine (CLARITIN) 10 MG tablet Take 10 mg by mouth daily as needed for allergies.    . metoprolol tartrate (LOPRESSOR) 25 MG tablet Take 1 tablet (25 mg total) by mouth 2 (two) times daily. 180 tablet 0  . Multiple Vitamin (MULTIVITAMIN WITH MINERALS) TABS tablet Take 1 tablet by mouth daily.    Marland Kitchen gabapentin (NEURONTIN) 300 MG capsule Take 1 capsule by mouth  every night at bedtime 90 capsule 0  . traMADol (ULTRAM) 50 MG tablet Take 1 tablet (50 mg total) by mouth every 8 (eight) hours as needed for moderate pain. 30 tablet 0  . atorvastatin (LIPITOR) 20 MG tablet Take 1 tablet (20 mg total) by mouth daily. Check fasting labs after 8-10 weeks 90 tablet 0  . gabapentin (NEURONTIN) 300 MG capsule  Take 1 capsule (300 mg total) by mouth at bedtime. Must transition care to new provider for future refills 30 capsule 0   No facility-administered medications prior to visit.     Past Medical History  Diagnosis Date  . Sleep apnea     wears CPAP nightly  . Hypertension     does not take meds  . Heart palpitations   . History of Holter monitoring   . Environmental and seasonal allergies   . GERD (gastroesophageal reflux disease)   . Nerve pain     neck; takes gabapentin  . Allergy      Past Surgical History  Procedure Laterality Date  . Rotator cuff repair Left   . Dental implants    . Inguinal hernia repair Left 08/25/2014    Procedure: LEFT INGUINAL HERNIA REPAIR REMOVAL SPERMATIC CORD MASS;  Surgeon: Jackolyn Confer, MD;  Location: [facility];  Service: General;  Laterality: Left;  . Insertion of mesh N/A 08/25/2014    Procedure: INSERTION OF MESH;  Surgeon: Jackolyn Confer, MD;  Location: [facility] OR;  Service: General;  Laterality: N/A;     Family History  Problem Relation Age of Onset  . Stroke Father 73  . Diabetes Father   . Diabetes Paternal Grandmother   . Stroke Paternal  Grandfather 70    guess early 46's  . Heart attack Maternal Grandfather      Social History   Social History  . Marital Status: Married    Spouse Name: N/A  . Number of Children: 2  . Years of Education: 12   Occupational History  . purchasing    Social History Main Topics  . Smoking status: Current Every Day Smoker -- 0.75 packs/day for 43 years    Types: Cigarettes  . Smokeless tobacco: Never Used  . Alcohol Use: No  . Drug Use: No  . Sexual Activity: Not on file   Other Topics Concern  . Not on file   Social History Narrative   Pt lives with his wife and two children.  Graduated high school.     Review of Systems  Constitutional: Denies fever, chills, fatigue, or significant weight gain/loss. HENT: Head: Denies headache or neck pain Ears: Denies changes in hearing, ringing in  ears, earache, drainage Nose: Denies discharge, stuffiness, itching, nosebleed, sinus pain Throat: Denies sore throat, hoarseness, dry mouth, sores, thrush Eyes: Denies loss/changes in vision, pain, redness, blurry/double vision, flashing lights Cardiovascular: Denies chest pain/discomfort, tightness, palpitations, shortness of breath with activity, difficulty lying down, swelling, sudden awakening with shortness of breath Respiratory: Denies shortness of breath, cough, sputum production, wheezing Gastrointestinal: Denies dysphasia, heartburn, change in appetite, nausea, change in bowel habits, rectal bleeding, constipation, diarrhea, yellow skin or eyes Genitourinary: Denies frequency, urgency, burning/pain, blood in urine, incontinence, change in urinary strength. Musculoskeletal: Denies muscle/joint pain, stiffness, back pain, redness or swelling of joints, trauma Skin: Denies rashes, lumps, itching, dryness, color changes, or hair/nail changes Neurological: Denies dizziness, fainting, seizures, weakness, numbness, tingling, tremor Psychiatric - Denies nervousness, stress, depression or memory loss Endocrine: Denies heat or cold intolerance, sweating, frequent urination, excessive thirst, changes in appetite Hematologic: Denies ease of bruising or bleeding     Objective:    BP 150/82 mmHg  Pulse 80  Temp(Src) 98.3 F (36.8 C) (Oral)  Resp 16  Ht 5\' 10"  (1.778 m)  Wt 202 lb (91.627 kg)  BMI 28.98 kg/m2  SpO2 96% Nursing note and vital signs reviewed.  Physical Exam  Constitutional: He is oriented to person, place, and time. He appears well-developed and well-nourished.  HENT:  Head: Normocephalic.  Right Ear: Hearing, tympanic membrane, external ear and ear canal normal.  Left Ear: Hearing, tympanic membrane, external ear and ear canal normal.  Nose: Nose normal.  Mouth/Throat: Uvula is midline, oropharynx is clear and moist and mucous membranes are normal.  Eyes: Conjunctivae  and EOM are normal. Pupils are equal, round, and reactive to light.  Neck: Neck supple. No JVD present. No tracheal deviation present. No thyromegaly present.  Cardiovascular: Normal rate, regular rhythm, normal heart sounds and intact distal pulses.   Pulmonary/Chest: Effort normal and breath sounds normal.  Abdominal: Soft. Bowel sounds are normal. He exhibits no distension and no mass. There is no tenderness. There is no rebound and no guarding.  Musculoskeletal: Normal range of motion. He exhibits no edema or tenderness.  Lymphadenopathy:    He has no cervical adenopathy.  Neurological: He is alert and oriented to person, place, and time. He has normal reflexes. No cranial nerve deficit. He exhibits normal muscle tone. Coordination normal.  Skin: Skin is warm and dry.  Psychiatric: He has a normal mood and affect. His behavior is normal. Judgment and thought content normal.       Assessment & Plan:   Problem  List Items Addressed This Visit      Other   Routine general medical examination at a health care facility - Primary    1) Anticipatory Guidance: Discussed importance of wearing a seatbelt while driving and not texting while driving; changing batteries in smoke detector at least once annually; wearing suntan lotion when outside; eating a balanced and moderate diet; getting physical activity at least 30 minutes per day.  2) Immunizations / Screenings / Labs:  Tetanus updated today. Declines influenza. All other immunizations are up-to-date per recommendations. Due for a vision exam encouraged to be completed independently. Due for colon cancer screening. Declines colonoscopy. Agrees to [employer] with paperwork completed. Declines hepatitis C screen. All other screenings are up-to-date per recommendations. Obtain CBC, CMET, Lipid profile and TSH.   Overall well exam with risk factors for cardiovascular disease including hypertension, tobacco use, and hyperlipidemia. Hypertension  currently managed with medication. Hyperlipidemia remains uncontrolled and not on medication secondary to muscle pains experience while on atorvastatin. Atorvastatin place and allergy list for intolerance to statins. Obtain lipid profile to determine current status. Consider alternative medications pending blood work. Discussed importance of quitting tobacco use to reduce the risk of cardiovascular and respiratory disease in the future. Patient declines cessation information at this time. We'll continue to monitor and work on decreasing tobacco usage. Discussed importance of eating a nutrient dense diet that is low in saturated fats and processed/sugary foods. Encouraged increased physical activity to goal of 30 minutes of moderate level activity daily. Continue other healthy lifestyle behaviors and choices. Follow-up prevention exam in 1 year. Up office visit for chronic conditions pending blood work.      Relevant Orders   Comprehensive metabolic panel   CBC   Lipid panel   TSH    Other Visit Diagnoses    Essential hypertension        Need for TD vaccine        Relevant Orders    Td vaccine greater than or equal to 7yo preservative free IM (Completed)

## 2016-01-13 NOTE — Progress Notes (Signed)
Pre visit review using our clinic review tool, if applicable. No additional management support is needed unless otherwise documented below in the visit note. 

## 2016-01-15 ENCOUNTER — Ambulatory Visit: Payer: 59 | Admitting: Neurology

## 2016-01-16 ENCOUNTER — Other Ambulatory Visit: Payer: Self-pay | Admitting: Internal Medicine

## 2016-01-20 ENCOUNTER — Ambulatory Visit: Payer: 59 | Admitting: Neurology

## 2016-01-20 DIAGNOSIS — Z029 Encounter for administrative examinations, unspecified: Secondary | ICD-10-CM

## 2016-02-10 ENCOUNTER — Other Ambulatory Visit: Payer: Self-pay | Admitting: Family

## 2016-02-12 LAB — COLOGUARD

## 2016-02-13 MED ORDER — TRAMADOL HCL 50 MG PO TABS: 50.0000 mg | ORAL_TABLET | Freq: Two times a day (BID) | ORAL | Status: DC | PRN

## 2016-02-13 NOTE — Addendum Note (Signed)
Addended by: Mauricio Po D on: 02/13/2016 04:08 PM   Modules accepted: Orders

## 2016-02-14 ENCOUNTER — Other Ambulatory Visit: Payer: Self-pay | Admitting: Family

## 2016-02-24 LAB — FECAL OCCULT BLOOD, GUAIAC: Fecal Occult Blood: NEGATIVE

## 2016-03-03 ENCOUNTER — Encounter: Payer: Self-pay | Admitting: Family

## 2016-03-14 ENCOUNTER — Other Ambulatory Visit: Payer: Self-pay | Admitting: Family

## 2016-03-14 MED ORDER — TRAMADOL HCL 50 MG PO TABS: 50.0000 mg | ORAL_TABLET | Freq: Two times a day (BID) | ORAL | Status: DC | PRN

## 2016-03-14 NOTE — Addendum Note (Signed)
Addended by: Mauricio Po D on: 03/14/2016 09:32 AM   Modules accepted: Orders

## 2016-04-04 ENCOUNTER — Ambulatory Visit (INDEPENDENT_AMBULATORY_CARE_PROVIDER_SITE_OTHER): Payer: 59 | Admitting: Family

## 2016-04-04 ENCOUNTER — Encounter: Payer: Self-pay | Admitting: Family

## 2016-04-04 VITALS — BP 138/68 | HR 63 | Temp 98.6°F | Resp 16 | Ht 70.0 in | Wt 197.8 lb

## 2016-04-04 DIAGNOSIS — R002 Palpitations: Secondary | ICD-10-CM

## 2016-04-04 DIAGNOSIS — I6523 Occlusion and stenosis of bilateral carotid arteries: Secondary | ICD-10-CM | POA: Diagnosis not present

## 2016-04-04 DIAGNOSIS — R42 Dizziness and giddiness: Secondary | ICD-10-CM

## 2016-04-04 DIAGNOSIS — M79602 Pain in left arm: Secondary | ICD-10-CM | POA: Diagnosis not present

## 2016-04-04 NOTE — Progress Notes (Signed)
Pre visit review using our clinic review tool, if applicable. No additional management support is needed unless otherwise documented below in the visit note. 

## 2016-04-04 NOTE — Patient Instructions (Signed)
Referral to vascular for symptoms and stenosis on last [place].   Aspirin.   If there is no improvement in your symptoms, or if there is any worsening of symptoms, or if you have any additional concerns, please return for re-evaluation; or, if we are closed, consider going to the Emergency Room for evaluation if symptoms urgent.  Stroke Prevention Some medical conditions and behaviors are associated with an increased chance of having a stroke. You may prevent a stroke by making healthy choices and managing medical conditions. HOW CAN I REDUCE MY RISK OF HAVING A STROKE?   Stay physically active. Get at least 30 minutes of activity on most or all days.  Do not smoke. It may also be helpful to avoid exposure to secondhand smoke.  Limit alcohol use. Moderate alcohol use is considered to be:  No more than 2 drinks per day for men.  No more than 1 drink per day for nonpregnant women.  Eat healthy foods. This involves:  Eating 5 or more servings of fruits and vegetables a day.  Making dietary changes that address high blood pressure (hypertension), high cholesterol, diabetes, or obesity.  Manage your cholesterol levels.  Making food choices that are high in fiber and low in saturated fat, trans fat, and cholesterol may control cholesterol levels.  Take any prescribed medicines to control cholesterol as directed by your health care provider.  Manage your diabetes.  Controlling your carbohydrate and sugar intake is recommended to manage diabetes.  Take any prescribed medicines to control diabetes as directed by your health care provider.  Control your hypertension.  Making food choices that are low in salt (sodium), saturated fat, trans fat, and cholesterol is recommended to manage hypertension.  Ask your health care provider if you need treatment to lower your blood pressure. Take any prescribed medicines to control hypertension as directed by your health care provider.  If you are  88-59 years of age, have your blood pressure checked every 3-5 years. If you are 46 years of age or older, have your blood pressure checked every year.  Maintain a healthy weight.  Reducing calorie intake and making food choices that are low in sodium, saturated fat, trans fat, and cholesterol are recommended to manage weight.  Stop drug abuse.  Avoid taking birth control pills.  Talk to your health care provider about the risks of taking birth control pills if you are over 85 years old, smoke, get migraines, or have ever had a blood clot.  Get evaluated for sleep disorders (sleep apnea).  Talk to your health care provider about getting a sleep evaluation if you snore a lot or have excessive sleepiness.  Take medicines only as directed by your health care provider.  For some people, aspirin or blood thinners (anticoagulants) are helpful in reducing the risk of forming abnormal blood clots that can lead to stroke. If you have the irregular heart rhythm of atrial fibrillation, you should be on a blood thinner unless there is a good reason you cannot take them.  Understand all your medicine instructions.  Make sure that other conditions (such as anemia or atherosclerosis) are addressed. SEEK IMMEDIATE MEDICAL CARE IF:   You have sudden weakness or numbness of the face, arm, or leg, especially on one side of the body.  Your face or eyelid droops to one side.  You have sudden confusion.  You have trouble speaking (aphasia) or understanding.  You have sudden trouble seeing in one or both eyes.  You have  sudden trouble walking.  You have dizziness.  You have a loss of balance or coordination.  You have a sudden, severe headache with no known cause.  You have new chest pain or an irregular heartbeat. Any of these symptoms may represent a serious problem that is an emergency. Do not wait to see if the symptoms will go away. Get medical help at once. Call your local emergency  services (911 in [place] Do not drive yourself to the hospital.   This information is not intended to replace advice given to you by your health care provider. Make sure you discuss any questions you have with your health care provider.   Document Released: 12/15/2004 Document Revised: 11/28/2014 Document Reviewed: 05/10/2013 Elsevier Interactive Patient Education [employer].

## 2016-04-04 NOTE — Progress Notes (Signed)
Subjective:    Patient ID: Tommy May, male    DOB: [birth year], 55 y.o.   MRN: [id number]   Tommy May is a 55 y.o. male who presents today for an acute visit.    HPI Comments: Patient presents of lightheadedness and nausea one week.no vision loss during this episode of dizzines. Lightheaded all the time, no triggers. Describes hot, clammy spells for a couple minutes. Left arm doesn't 'feel right', occasional numbness and tingling. Has h/o cervical spine pain for which he takes tramadol for.  Eyes feel 'pressure' though no current sinus congestion. Denies vertigo, tinnitus, loss of hearing.   Works in [employer] and has 'over extended himself', endorses a lot of stress and anxiety right now. Hasn't had a day off in 3 weeks and reports carrying heavy boxes for work which may be affected his left arm.   Wife is a Therapist, sports and is concerned he is having TIAs urging him to come in today. Father had a stroke.  Per chart review, 06/2015 had vision loss and workup for TIA - MRI and carotid doppler. Carotid doppler   09/2015 Seen by neurology for left arm numbness.Advised to start aspirin and Lipitor at that time which patient did not.09/2015 CT angiogram neck showed no flow limiting stenosis. 09/2015 MR brain showed no small vessel disease.  Smoker. Not taking ASA or statin ( statin allergy) at this time.   Past Medical History  Diagnosis Date  . Sleep apnea     wears CPAP nightly  . Hypertension     does not take meds  . Heart palpitations   . History of Holter monitoring   . Environmental and seasonal allergies   . GERD (gastroesophageal reflux disease)   . Nerve pain     neck; takes gabapentin  . Allergy    Allergies: Atorvastatin Current Outpatient Prescriptions on File Prior to Visit  Medication Sig Dispense Refill  . acetaminophen (TYLENOL) 500 MG tablet Take 500 mg by mouth every 8 (eight) hours as needed for moderate pain.    Marland Kitchen esomeprazole (NEXIUM) 40 MG capsule Take  40 mg by mouth daily.     Marland Kitchen gabapentin (NEURONTIN) 300 MG capsule Take 1 capsule by mouth  every night at bedtime 90 capsule 0  . loratadine (CLARITIN) 10 MG tablet Take 10 mg by mouth daily as needed for allergies.    . metoprolol tartrate (LOPRESSOR) 25 MG tablet TAKE 1 TABLET (25 MG TOTAL) BY MOUTH 2 (TWO) TIMES DAILY. (Patient taking differently: TAKE 1 TABLET BY MOUTH DAILY) 180 tablet 2  . Multiple Vitamin (MULTIVITAMIN WITH MINERALS) TABS tablet Take 1 tablet by mouth daily.    . traMADol (ULTRAM) 50 MG tablet Take 1 tablet (50 mg total) by mouth 2 (two) times daily as needed for moderate pain. 60 tablet 1   No current facility-administered medications on file prior to visit.    Social History  Substance Use Topics  . Smoking status: Current Every Day Smoker -- 0.75 packs/day for 43 years    Types: Cigarettes  . Smokeless tobacco: Never Used  . Alcohol Use: No    Review of Systems  Constitutional: Positive for chills. Negative for fever.  HENT: Negative for congestion and hearing loss.   Eyes: Negative for visual disturbance.  Respiratory: Negative for cough, shortness of breath and wheezing.   Cardiovascular: Negative for chest pain, palpitations and leg swelling.  Neurological: Positive for dizziness. Negative for syncope and weakness.  Objective:    BP 138/68 mmHg  Pulse 63  Temp(Src) 98.6 F (37 C) (Oral)  Resp 16  Ht 5\' 10"  (1.778 m)  Wt 197 lb 12.8 oz (89.721 kg)  BMI 28.38 kg/m2  SpO2 98%   Physical Exam  Constitutional: He appears well-developed and well-nourished.  HENT:  Head: Normocephalic and atraumatic.  Right Ear: Hearing, tympanic membrane, external ear and ear canal normal. No drainage, swelling or tenderness. Tympanic membrane is not injected, not erythematous and not bulging. No middle ear effusion. No decreased hearing is noted.  Left Ear: Hearing, tympanic membrane, external ear and ear canal normal. No drainage, swelling or tenderness.  Tympanic membrane is not injected, not erythematous and not bulging.  No middle ear effusion. No decreased hearing is noted.  Nose: Nose normal. Right sinus exhibits no maxillary sinus tenderness and no frontal sinus tenderness. Left sinus exhibits no maxillary sinus tenderness and no frontal sinus tenderness.  Mouth/Throat: Uvula is midline, oropharynx is clear and moist and mucous membranes are normal. No oropharyngeal exudate, posterior oropharyngeal edema, posterior oropharyngeal erythema or tonsillar abscesses.  Eyes: Conjunctivae, EOM and lids are normal. Pupils are equal, round, and reactive to light. Lids are everted and swept, no foreign bodies found.  Normal fundus bilaterally  Neck: Neck supple. Muscular tenderness present. No spinous process tenderness present. No rigidity. Decreased range of motion present. No edema and no erythema present.    Muscle tenderness left trapezius. Slight reduction in lateral neck ROM .  Cardiovascular: Regular rhythm and normal heart sounds.   Pulmonary/Chest: Effort normal and breath sounds normal. No respiratory distress. He has no wheezes. He has no rhonchi. He has no rales.  Lymphadenopathy:       Head (right side): No submental, no submandibular, no tonsillar, no preauricular, no posterior auricular and no occipital adenopathy present.       Head (left side): No submental, no submandibular, no tonsillar, no preauricular, no posterior auricular and no occipital adenopathy present.    He has no cervical adenopathy.  Neurological: He is alert. He has normal strength. No cranial nerve deficit or sensory deficit. He displays a negative Romberg sign.  Reflex Scores:      Bicep reflexes are 2+ on the right side and 2+ on the left side.      Patellar reflexes are 2+ on the right side and 2+ on the left side. Grip equal and strong bilateral upper extremities. Gait strong and steady. Able to perform  finger-to-nose without difficulty.   Skin: Skin is warm and  dry.  Psychiatric: He has a normal mood and affect. His speech is normal and behavior is normal.  Vitals reviewed.      Assessment & Plan:  1. Dizziness Reassured by normal cardiac and neurologic exam. Patient has had a thorough work up from neurology including CT angio and MRI brain which are normal, reassuring. EKG is normal today. Patient politely declined MRI of brain today is to evaluate for TIA and understands that an MRI showing TIA may help [place] to understand his risk for CVA. - EKG 12-Lead.SR. 59 bpm. No signs or symptoms of ischemia.   2. Carotid stenosis, bilateral Bilateral ICA 60-79% stenosis seen on carotid doppler 06/2015 however CT angio neck did not reflect these results.  Patient and I agreed on consult to vascular as patient may be a candidate for CEA or stenting due to personal and family history as well as dizziness symptom.   Patient and I had a long  discussion about the importance of prevention when it comes to TIAs/CVAs including cholesterol management, smoking cessation, blood pressure management. His blood pressure is at goal. He is pending his lipid labs ( from 12/2015) from his PCP which he stated he would have done this week. He would benefit from statin therapy and could trial a different medication other than atorvastatin. He is not taking his aspirin and I encouraged him to do so as his neurologist did as well. .  - Ambulatory referral to Vascular Surgery  3. 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 Nerve study normal from neurology . Alternately, muscle spasm. Reassured from EKG and absence of cardiac symptoms that pain is not from a cardiac etiology.    I am having Mr. Mcglathery maintain his multivitamin with minerals, esomeprazole, loratadine, acetaminophen, gabapentin, metoprolol tartrate, and traMADol.   No orders of the defined types were placed in this  encounter.     Start medications as prescribed and explained to patient on After Visit Summary ( AVS). Risks, benefits, and alternatives of the medications and treatment plan prescribed today were discussed, and patient expressed understanding.   Education regarding symptom management and diagnosis given to patient.   Follow-up:Plan follow-up as discussed or as needed if any worsening symptoms or change in condition.   Continue to follow with Tommy Po, FNP for routine health maintenance.   [name] and I agreed with plan.   Mable Paris, FNP  Total of 25 minutes spent with patient, greater than 50% of which was spent in discussion of TIA prevention.

## 2016-04-08 ENCOUNTER — Encounter: Payer: Self-pay | Admitting: Family

## 2016-04-08 ENCOUNTER — Other Ambulatory Visit (INDEPENDENT_AMBULATORY_CARE_PROVIDER_SITE_OTHER): Payer: 59

## 2016-04-08 DIAGNOSIS — R7989 Other specified abnormal findings of blood chemistry: Secondary | ICD-10-CM

## 2016-04-08 DIAGNOSIS — Z0000 Encounter for general adult medical examination without abnormal findings: Secondary | ICD-10-CM | POA: Diagnosis not present

## 2016-04-08 LAB — LIPID PANEL
Cholesterol: 320 mg/dL — ABNORMAL HIGH (ref 0–200)
HDL: 33.1 mg/dL — ABNORMAL LOW (ref >39.00)
Total CHOL/HDL Ratio: 10
Triglycerides: 681 mg/dL — ABNORMAL HIGH (ref 0.0–149.0)

## 2016-04-08 LAB — COMPREHENSIVE METABOLIC PANEL
ALT: 20 U/L (ref 0–53)
AST: 14 U/L (ref 0–37)
Albumin: 3.5 g/dL (ref 3.5–5.2)
Alkaline Phosphatase: 57 U/L (ref 39–117)
BUN: 15 mg/dL (ref 6–23)
CO2: 26 mEq/L (ref 19–32)
Calcium: 9 mg/dL (ref 8.4–10.5)
Chloride: 106 mEq/L (ref 96–112)
Creatinine, Ser: 0.81 mg/dL (ref 0.40–1.50)
GFR: 104.36 mL/min (ref >60.00)
Glucose, Bld: 126 mg/dL — ABNORMAL HIGH (ref 70–99)
Potassium: 4.5 mEq/L (ref 3.5–5.1)
Sodium: 138 mEq/L (ref 135–145)
Total Bilirubin: 0.4 mg/dL (ref 0.2–1.2)
Total Protein: 6 g/dL (ref 6.0–8.3)

## 2016-04-08 LAB — CBC
HCT: 48.3 % (ref 39.0–52.0)
Hemoglobin: 16.8 g/dL (ref 13.0–17.0)
MCHC: 34.7 g/dL (ref 30.0–36.0)
MCV: 88.9 fl (ref 78.0–100.0)
Platelets: 203 10*3/uL (ref 150.0–400.0)
RBC: 5.43 Mil/uL (ref 4.22–5.81)
RDW: 12.4 % (ref 11.5–15.5)
WBC: 7.7 10*3/uL (ref 4.0–10.5)

## 2016-04-08 LAB — TSH: TSH: 1.7 u[IU]/mL (ref 0.35–4.50)

## 2016-04-08 LAB — LDL CHOLESTEROL, DIRECT: Direct LDL: 132 mg/dL

## 2016-04-08 MED ORDER — FENOFIBRATE 145 MG PO TABS: 145.0000 mg | ORAL_TABLET | Freq: Every day | ORAL | Status: DC

## 2016-04-15 ENCOUNTER — Other Ambulatory Visit: Payer: Self-pay

## 2016-04-15 DIAGNOSIS — I6523 Occlusion and stenosis of bilateral carotid arteries: Secondary | ICD-10-CM

## 2016-04-16 ENCOUNTER — Other Ambulatory Visit: Payer: Self-pay | Admitting: Family

## 2016-04-19 ENCOUNTER — Encounter: Payer: Self-pay | Admitting: Vascular Surgery

## 2016-04-27 ENCOUNTER — Telehealth: Payer: Self-pay | Admitting: Family

## 2016-04-27 ENCOUNTER — Encounter: Payer: Self-pay | Admitting: Vascular Surgery

## 2016-04-27 ENCOUNTER — Ambulatory Visit (HOSPITAL_COMMUNITY)
Admission: RE | Admit: 2016-04-27 | Discharge: 2016-04-27 | Disposition: A | Discharge status: Home / Self Care | Payer: 59 | Source: Ambulatory Visit | Attending: Vascular Surgery | Admitting: Vascular Surgery

## 2016-04-27 ENCOUNTER — Ambulatory Visit (INDEPENDENT_AMBULATORY_CARE_PROVIDER_SITE_OTHER): Payer: 59 | Admitting: Vascular Surgery

## 2016-04-27 VITALS — BP 137/73 | HR 74 | Temp 98.0°F | Resp 16

## 2016-04-27 DIAGNOSIS — I10 Essential (primary) hypertension: Secondary | ICD-10-CM | POA: Insufficient documentation

## 2016-04-27 DIAGNOSIS — R42 Dizziness and giddiness: Secondary | ICD-10-CM

## 2016-04-27 DIAGNOSIS — E7800 Pure hypercholesterolemia, unspecified: Secondary | ICD-10-CM

## 2016-04-27 DIAGNOSIS — I6523 Occlusion and stenosis of bilateral carotid arteries: Secondary | ICD-10-CM

## 2016-04-27 DIAGNOSIS — K219 Gastro-esophageal reflux disease without esophagitis: Secondary | ICD-10-CM | POA: Insufficient documentation

## 2016-04-27 DIAGNOSIS — I779 Disorder of arteries and arterioles, unspecified: Secondary | ICD-10-CM | POA: Diagnosis not present

## 2016-04-27 DIAGNOSIS — I739 Peripheral vascular disease, unspecified: Secondary | ICD-10-CM

## 2016-04-27 LAB — VAS US CAROTID
LEFT ECA DIAS: -7 cm/s
Left CCA dist dias: -28 cm/s
Left CCA dist sys: -147 cm/s
Left CCA prox dias: 18 cm/s
Left CCA prox sys: 145 cm/s
Left ICA dist dias: -28 cm/s
Left ICA dist sys: -75 cm/s
Left ICA prox dias: -88 cm/s
Left ICA prox sys: -409 cm/s
RIGHT CCA MID DIAS: 21 cm/s
RIGHT ECA DIAS: -15 cm/s
Right CCA prox dias: 5 cm/s
Right CCA prox sys: 150 cm/s
Right cca dist sys: -87 cm/s

## 2016-04-27 NOTE — Progress Notes (Signed)
Referring:  [address], [place], [place] [address]  Reason for consult: B carotid stenosis  History of Present Illness:   Tommy May is a 55 y.o. ([birth year]) male  Who presents for evaluation of carotid stenosis.  The patient denies symptoms of TIA, amaurosis, or stroke.  He does however have symptoms of dizziness, "tunne;" vision with intact peripheral vision and left ulnar nerve distribution pain.    The carotid stenosis was found via carotid duplex.  Other medical problems include hypertension managed with metoprolol, hyperlipidemia managed currently with tricor.  He  had a trial of Lipitor that cause severe muscle pains after taking it for 1 month.   Other contributing factors include a family history of stroke both father and grandfather, and a family history of hyper lipidemia.  He is also a smoker.   Past Medical History  Diagnosis Date  . Sleep apnea     wears CPAP nightly  . Hypertension     does not take meds  . Heart palpitations   . History of Holter monitoring   . Environmental and seasonal allergies   . GERD (gastroesophageal reflux disease)   . Nerve pain     neck; takes gabapentin  . Allergy     Past Surgical History  Procedure Laterality Date  . Rotator cuff repair Left   . Dental implants    . Inguinal hernia repair Left 08/25/2014    Procedure: LEFT INGUINAL HERNIA REPAIR REMOVAL SPERMATIC CORD MASS;  Surgeon: Jackolyn Confer, MD;  Location: [facility];  Service: General;  Laterality: Left;  . Insertion of mesh N/A 08/25/2014    Procedure: INSERTION OF MESH;  Surgeon: Jackolyn Confer, MD;  Location: [facility];  Service: General;  Laterality: N/A;     Social History Social History  Substance Use Topics  . Smoking status: Current Every Day Smoker -- 0.75 packs/day for 43 years    Types: Cigarettes  . Smokeless tobacco: Never Used  . Alcohol Use: No    Family History Family History  Problem Relation Age of Onset  . Stroke Father 49    . Diabetes Father   . Diabetes Paternal Grandmother   . Stroke Paternal Grandfather 53    guess early 40's  . Heart attack Maternal Grandfather     Allergies  Allergies  Allergen Reactions  . Atorvastatin     Muscle pain     Current Outpatient Prescriptions  Medication Sig Dispense Refill  . acetaminophen (TYLENOL) 500 MG tablet Take 500 mg by mouth every 8 (eight) hours as needed for moderate pain.    Marland Kitchen esomeprazole (NEXIUM) 40 MG capsule Take 40 mg by mouth daily.     . fenofibrate (TRICOR) 145 MG tablet Take 1 tablet (145 mg total) by mouth daily. 30 tablet 1  . gabapentin (NEURONTIN) 300 MG capsule TAKE ONE CAPSULE BY MOUTH AT BEDTIME 90 capsule 0  . loratadine (CLARITIN) 10 MG tablet Take 10 mg by mouth daily as needed for allergies.    . metoprolol tartrate (LOPRESSOR) 25 MG tablet TAKE 1 TABLET (25 MG TOTAL) BY MOUTH 2 (TWO) TIMES DAILY. (Patient taking differently: TAKE 1 TABLET BY MOUTH DAILY) 180 tablet 2  . Multiple Vitamin (MULTIVITAMIN WITH MINERALS) TABS tablet Take 1 tablet by mouth daily.    . traMADol (ULTRAM) 50 MG tablet Take 1 tablet (50 mg total) by mouth 2 (two) times daily as needed for moderate pain. 60 tablet 1  No current facility-administered medications for this visit.    ROS:   General:  No weight loss, Fever, chills  HEENT: No recent headaches, no nasal bleeding, no visual changes, no sore throat  Neurologic: No dizziness, blackouts, seizures. No recent symptoms of stroke or mini- stroke. No recent episodes of slurred speech, or temporary blindness.  Cardiac: No recent episodes of chest pain/pressure, no shortness of breath at rest.  No shortness of breath with exertion.  Denies history of atrial fibrillation or irregular heartbeat  Vascular: No history of rest pain in feet.  No history of claudication.  No history of non-healing ulcer, No history of DVT   Pulmonary: No home oxygen, no productive cough, no hemoptysis,  No asthma or wheezing  [x]  CPAP sleep apnea  Musculoskeletal:  [x ] Arthritis, [ ]  Low back pain,  [ ]  Joint pain  Hematologic:No history of hypercoagulable state.  No history of easy bleeding.  No history of anemia  Gastrointestinal: No hematochezia or melena,  No gastroesophageal reflux, no trouble swallowing  Urinary: [ ]  chronic Kidney disease, [ ]  on HD - [ ]  MWF or [ ]  TTHS, [ ]  Burning with urination, [ ]  Frequent urination, [ ]  Difficulty urinating;   Skin: No rashes  Psychological: No history of anxiety,  No history of depression   Physical Examination  Filed Vitals:   04/27/16 1428 04/27/16 1430  BP: 133/82 137/73  Pulse: 78 74  Temp: 98 F (36.7 C)   TempSrc: Oral   Resp: 16     There is no weight on file to calculate BMI.  General:  Alert and oriented, no acute distress HEENT: Normal, pharyngeal erythema Neck: No bruit or JVD Pulmonary: Clear to auscultation bilaterally Cardiac: Regular Rate and Rhythm without murmur Gastrointestinal: Soft, non-tender, non-distended, no mass, no scars Skin: No rash, bilateral LE with varicosities and cyanosis in feet in a dependent position. Extremity Pulses:  2+ radial, brachial, femoral, dorsalis pedis, posterior tibial pulses bilaterally Musculoskeletal: No deformity or edema  Neurologic: Upper and lower extremity motor 5/5 and symmetric Psychiatric: Judgment intact, Mood & affect appropriate for pt's clinical situation Lymph : No Cervical, Axillary, or Inguinal lymphadenopathy    DATA:  Carotids duplex  Right 1-39% stenosis ICA (164/53 c/s) Left 60-79% stenosis ICA (409/88 c/s) Vertebral artery flow is normal  Laboratory: CBC:    Component Value Date/Time   WBC 7.7 04/08/2016 0730   RBC 5.43 04/08/2016 0730   HGB 16.8 04/08/2016 0730   HCT 48.3 04/08/2016 0730   PLT 203.0 04/08/2016 0730   MCV 88.9 04/08/2016 0730   MCH 30.9 08/21/2014 1335   MCHC 34.7 04/08/2016 0730   RDW 12.4 04/08/2016 0730   LYMPHSABS 3.0 07/21/2015 1643    MONOABS 0.8 07/21/2015 1643   EOSABS 0.1 07/21/2015 1643   BASOSABS 0.0 07/21/2015 1643    BMP:    Component Value Date/Time   NA 138 04/08/2016 0730   K 4.5 04/08/2016 0730   CL 106 04/08/2016 0730   CO2 26 04/08/2016 0730   GLUCOSE 126* 04/08/2016 0730   BUN 15 04/08/2016 0730   CREATININE 0.81 04/08/2016 0730   CALCIUM 9.0 04/08/2016 0730   GFRNONAA >90 08/21/2014 1335   GFRAA >90 08/21/2014 1335    Coagulation: Lab Results  Component Value Date   INR 0.90 08/21/2014   No results found for: PTT  Lipids:    Component Value Date/Time   CHOL 320* 04/08/2016 0730   TRIG * 04/08/2016 0730  681.0 Triglyceride is over 400; calculations on Lipids are invalid.   HDL 33.10* 04/08/2016 0730   CHOLHDL 10 04/08/2016 0730   LDLDIRECT 132.0 04/08/2016 0730       ASSESSMENT:  Asx Left ICA stenosis <80% Asx R ICA stenosis <70% Hypertriglyceridemia 681 mg/dl Hyperlipidemia Ongoing Tobacco abuse  HTN   PLAN:  Repeat carotid duplex in 6 months.  He is asymptomatic from a carotid stenosis point of view.  His symptoms of vertigo may be stemming from a cardiac abnormality.  This may required further cardiac work up.    He should probably do further trials with other statin medication to reduce his cholesterol, especially with his known family history.  He has been advised to stop smoking which is the one active thing he personally can control.   Theda Sers, EMMA MAUREEN PA-C [facility]  The patient was seen today in conjunction with Dr. Bridgett Larsson  Addendum  I have independently interviewed and examined the patient, and I agree with the physician assistant's findings.  I doubt his sx are due to his carotid arteries.  He has no evidence of vertebral disease on the carotid duplex currently.  He has had increase in PSV on L side but still his EDV put him in the <80% range.  Focus should be on maximal medical management at this point  - With  Cholesterol >300, I suspect he has some element of familial hyperlipidemia.  I would consider trial of another statin despite his reported muscle fatigue with Lipitor. - I have discussed the need for smoking cessation with this patient. - In regards to his HTN, further rx titration may be needed given his side effects from his beta-blocker. - Will continue q6 month carotid duplex.  If he exceed 80% would consider proceeding with L CEA.   Adele Barthel, MD [facility] Office: [phone] Pager: [phone]  04/27/2016, 3:33 PM

## 2016-04-27 NOTE — Telephone Encounter (Signed)
Please also let patient know that I placed a referral to cardiology to nonspecific left arm pain and dizziness.

## 2016-04-27 NOTE — Telephone Encounter (Signed)
These call patient and let him know we placed a referral to the lipid clinic due to his allergy to statin therapy and his very high cholesterol. He would like for them to see and treat him.

## 2016-04-28 NOTE — Telephone Encounter (Signed)
Pt informed of referrals and has an appt with cardiology.

## 2016-05-09 ENCOUNTER — Other Ambulatory Visit: Payer: Self-pay | Admitting: Family

## 2016-05-09 NOTE — Telephone Encounter (Signed)
Last refill was 03/14/16

## 2016-06-02 ENCOUNTER — Ambulatory Visit: Payer: 59 | Admitting: Cardiology

## 2016-06-06 ENCOUNTER — Telehealth: Payer: Self-pay

## 2016-06-06 MED ORDER — FENOFIBRATE 145 MG PO TABS: 145.0000 mg | ORAL_TABLET | Freq: Every day | ORAL | Status: DC

## 2016-06-06 NOTE — Telephone Encounter (Signed)
Refill request on fenofibrate, patient has been in within last 6 months, refilled one month with one refill

## 2016-06-21 ENCOUNTER — Other Ambulatory Visit: Payer: Self-pay | Admitting: *Deleted

## 2016-06-21 DIAGNOSIS — I6523 Occlusion and stenosis of bilateral carotid arteries: Secondary | ICD-10-CM

## 2016-07-04 ENCOUNTER — Other Ambulatory Visit: Payer: Self-pay | Admitting: Family

## 2016-07-06 NOTE — Telephone Encounter (Signed)
Faxed script back to [facility].../lmb 

## 2016-07-11 ENCOUNTER — Other Ambulatory Visit: Payer: Self-pay | Admitting: Family

## 2016-08-01 ENCOUNTER — Other Ambulatory Visit: Payer: Self-pay | Admitting: Family

## 2016-08-03 NOTE — Progress Notes (Signed)
Referring: Dr Bridgett Larsson  HPI: 55 year old male for evaluation of dizziness, hyperlipidemia and hypertension. Nuclear study September 2003 showed ejection fraction 72% and normal perfusion. Echocardiogram September 2016 showed normal LV systolic function and grade 1 diastolic dysfunction. Carotid Dopplers June 2017 showed 60-79% left and 1-39% right stenosis. Seen by vascular surgery and medical therapy recommended with follow-up studies. Patient had episodes of visual disturbance in the past. There is associated dizziness. No associated weakness or loss of sensation in the extremities. This has improved after he has changed stressful jobs. He does not have dyspnea on exertion, orthopnea, PND, pedal edema, chest pain or syncope. He has had problems with palpitations in the past that have improved on Toprol.  Current Outpatient Prescriptions  Medication Sig Dispense Refill  . acetaminophen (TYLENOL) 500 MG tablet Take 500 mg by mouth every 8 (eight) hours as needed for moderate pain.    Marland Kitchen esomeprazole (NEXIUM) 40 MG capsule Take 40 mg by mouth daily.     . fenofibrate (TRICOR) 145 MG tablet TAKE 1 TABLET (145 MG TOTAL) BY MOUTH DAILY. 30 tablet 1  . gabapentin (NEURONTIN) 300 MG capsule TAKE ONE CAPSULE BY MOUTH AT BEDTIME 90 capsule 1  . loratadine (CLARITIN) 10 MG tablet Take 10 mg by mouth daily as needed for allergies.    . metoprolol succinate (TOPROL-XL) 25 MG 24 hr tablet Take 25 mg by mouth daily.    . Multiple Vitamin (MULTIVITAMIN WITH MINERALS) TABS tablet Take 1 tablet by mouth daily.    . traMADol (ULTRAM) 50 MG tablet TAKE 1 TABLET BY MOUTH TWICE A DAY AS NEEDED FOR MODERATE PAIN 60 tablet 1   No current facility-administered medications for this visit.     Allergies  Allergen Reactions  . Atorvastatin     Muscle pain     Past Medical History:  Diagnosis Date  . Allergy   . Environmental and seasonal allergies   . GERD (gastroesophageal reflux disease)   . Heart palpitations    . History of Holter monitoring   . Hyperlipidemia   . Hypertension    does not take meds  . Nerve pain    neck; takes gabapentin  . Sleep apnea    wears CPAP nightly    Past Surgical History:  Procedure Laterality Date  . dental implants    . INGUINAL HERNIA REPAIR Left 08/25/2014   Procedure: LEFT INGUINAL HERNIA REPAIR REMOVAL SPERMATIC CORD MASS;  Surgeon: Jackolyn Confer, MD;  Location: [facility];  Service: General;  Laterality: Left;  . INSERTION OF MESH N/A 08/25/2014   Procedure: INSERTION OF MESH;  Surgeon: Jackolyn Confer, MD;  Location: [facility];  Service: General;  Laterality: N/A;  . ROTATOR CUFF REPAIR Left     Social History   Social History  . Marital status: Married    Spouse name: N/A  . Number of children: 2  . Years of education: 12   Occupational History  . purchasing    Social History Main Topics  . Smoking status: Current Every Day Smoker    Packs/day: 0.75    Years: 43.00    Types: Cigarettes  . Smokeless tobacco: Never Used  . Alcohol use No  . Drug use: No  . Sexual activity: Not on file   Other Topics Concern  . Not on file   Social History Narrative   Pt lives with his wife and two children.  Graduated high school.    Family History  Problem Relation Age of Onset  .  Stroke Father 47  . Diabetes Father   . Diabetes Paternal Grandmother   . Stroke Paternal Grandfather 92    guess early 48's  . Heart attack Maternal Grandfather     ROS: no fevers or chills, productive cough, hemoptysis, dysphasia, odynophagia, melena, hematochezia, dysuria, hematuria, rash, seizure activity, orthopnea, PND, pedal edema, claudication. Remaining systems are negative.  Physical Exam:   Blood pressure (!) 154/82, pulse 70, height 5\' 10"  (1.778 m), weight 197 lb 12.8 oz (89.7 kg).  General:  Well developed/well nourished in NAD Skin warm/dry Patient not depressed No peripheral clubbing Back-normal HEENT-normal/normal eyelids Neck supple/normal carotid  upstroke bilaterally; Left carotid bruit; no JVD; no thyromegaly chest - CTA/ normal expansion CV - RRR/normal S1 and S2; no murmurs, rubs or gallops;  PMI nondisplaced Abdomen -NT/ND, no HSM, no mass, + bowel sounds, no bruit 2+ femoral pulses, no bruits Ext-no edema, chords, 2+ DP Neuro-grossly nonfocal  ECG Sinus rhythm at a rate of 70. No ST changes.  A/P  1 Hypertension-blood pressure is mildly elevated. He takes Toprol for both blood pressure and palpitations but has some fatigue. I'm therefore has been to advance the dose of this. Add lisinopril 10 mg daily. Check potassium and renal function in 1 week. Increase as needed for blood pressure control.  2 hyperlipidemia-previous cholesterol was greater than 300. He had some myalgias with Lipitor previously. Discontinue TriCor. Begin Crestor 40 mg daily. Check lipids and liver in 4 weeks. He may need to be seen and lipid clinic in the future.  3 carotid artery disease-decrease aspirin from 325 to 81 mg daily. Add statin. Follow-up carotid Dopplers with vascular surgery.  4 tobacco abuse-patient counseled on discontinuing.  Kirk Ruths, MD

## 2016-08-04 ENCOUNTER — Encounter: Payer: Self-pay | Admitting: Cardiology

## 2016-08-04 ENCOUNTER — Ambulatory Visit (INDEPENDENT_AMBULATORY_CARE_PROVIDER_SITE_OTHER): Payer: BLUE CROSS/BLUE SHIELD | Admitting: Cardiology

## 2016-08-04 VITALS — BP 154/82 | HR 70 | Ht 70.0 in | Wt 197.8 lb

## 2016-08-04 DIAGNOSIS — E785 Hyperlipidemia, unspecified: Secondary | ICD-10-CM | POA: Diagnosis not present

## 2016-08-04 DIAGNOSIS — I779 Disorder of arteries and arterioles, unspecified: Secondary | ICD-10-CM

## 2016-08-04 DIAGNOSIS — Z720 Tobacco use: Secondary | ICD-10-CM

## 2016-08-04 DIAGNOSIS — I739 Peripheral vascular disease, unspecified: Secondary | ICD-10-CM

## 2016-08-04 DIAGNOSIS — I10 Essential (primary) hypertension: Secondary | ICD-10-CM | POA: Diagnosis not present

## 2016-08-04 MED ORDER — ROSUVASTATIN CALCIUM 40 MG PO TABS: 40.0000 mg | ORAL_TABLET | Freq: Every day | ORAL | 12 refills | Status: DC

## 2016-08-04 MED ORDER — LISINOPRIL 10 MG PO TABS: 10.0000 mg | ORAL_TABLET | Freq: Every day | ORAL | 12 refills | Status: DC

## 2016-08-04 MED ORDER — ASPIRIN EC 81 MG PO TBEC: 81.0000 mg | DELAYED_RELEASE_TABLET | Freq: Every day | ORAL | 3 refills | Status: DC

## 2016-08-04 NOTE — Patient Instructions (Signed)
Medication Instructions:   STOP FENOFIBRATE  START ROSUVASTATIN 40 MG ONCE DAILY  DECREASE ASPIRIN TO 81 MG ONCE DAILY  START LISINOPRIL 10 MG ONCE DAILY  Labwork:  Your physician recommends that you return for lab work in: [place] recommends that you return for lab work in: [place] = DO NOT EAT PRIOR TO LAB WORK  Follow-Up:  Your physician wants you to follow-up in: [place] will receive a reminder letter in the mail two months in advance. If you don't receive a letter, please call our office to schedule the follow-up appointment.   If you need a refill on your cardiac medications before your next appointment, please call your pharmacy.

## 2016-08-13 LAB — BASIC METABOLIC PANEL
BUN: 13 mg/dL (ref 7–25)
CO2: 25 mmol/L (ref 20–31)
Calcium: 8.7 mg/dL (ref 8.6–10.3)
Chloride: 106 mmol/L (ref 98–110)
Creat: 0.97 mg/dL (ref 0.70–1.33)
Glucose, Bld: 95 mg/dL (ref 65–99)
Potassium: 4.3 mmol/L (ref 3.5–5.3)
Sodium: 139 mmol/L (ref 135–146)

## 2016-08-29 ENCOUNTER — Other Ambulatory Visit: Payer: Self-pay | Admitting: Family

## 2016-08-29 NOTE — Telephone Encounter (Signed)
Tommy May is out of the office pls advise on refill...Johny Chess

## 2016-08-29 NOTE — Telephone Encounter (Signed)
Faxed script back to [facility].../lmb 

## 2016-09-09 ENCOUNTER — Telehealth: Payer: Self-pay | Admitting: *Deleted

## 2016-09-09 LAB — HEPATIC FUNCTION PANEL
ALT: 19 U/L (ref 9–46)
AST: 18 U/L (ref 10–35)
Albumin: 3.5 g/dL — ABNORMAL LOW (ref 3.6–5.1)
Alkaline Phosphatase: 57 U/L (ref 40–115)
Bilirubin, Direct: 0.1 mg/dL (ref <=0.2)
Indirect Bilirubin: 0.5 mg/dL (ref 0.2–1.2)
Total Bilirubin: 0.6 mg/dL (ref 0.2–1.2)
Total Protein: 6 g/dL — ABNORMAL LOW (ref 6.1–8.1)

## 2016-09-09 LAB — LIPID PANEL
Cholesterol: 208 mg/dL — ABNORMAL HIGH (ref 125–200)
HDL: 41 mg/dL (ref >=40)
LDL Cholesterol: 125 mg/dL (ref <130)
Total CHOL/HDL Ratio: 5.1 Ratio — ABNORMAL HIGH (ref <=5.0)
Triglycerides: 212 mg/dL — ABNORMAL HIGH (ref <150)
VLDL: 42 mg/dL — ABNORMAL HIGH (ref <30)

## 2016-09-09 MED ORDER — EZETIMIBE 10 MG PO TABS: 10.0000 mg | ORAL_TABLET | Freq: Every day | ORAL | 6 refills | Status: DC

## 2016-09-09 NOTE — Telephone Encounter (Signed)
-----   Message from Lelon Perla, MD sent at 09/09/2016  6:47 AM EDT ----- Add zetia 10 mg daily lipids and liver 6 weeks Kirk Ruths

## 2016-09-09 NOTE — Telephone Encounter (Signed)
Unable to reach pt or leave a message voicemail is not set up Message sent to patient through my chart to call to discuss

## 2016-09-09 NOTE — Telephone Encounter (Signed)
Spoke with pt, New script sent to the pharmacy he will let me know if he is unable to afford.

## 2016-10-24 ENCOUNTER — Other Ambulatory Visit: Payer: Self-pay | Admitting: Internal Medicine

## 2016-10-25 NOTE — Telephone Encounter (Signed)
rf rq for tramadol.

## 2016-10-26 ENCOUNTER — Encounter: Payer: Self-pay | Admitting: Family

## 2016-10-28 ENCOUNTER — Ambulatory Visit: Payer: 59 | Admitting: Vascular Surgery

## 2016-10-28 ENCOUNTER — Encounter (HOSPITAL_COMMUNITY): Payer: 59

## 2016-10-28 ENCOUNTER — Other Ambulatory Visit: Payer: Self-pay | Admitting: Internal Medicine

## 2016-10-31 NOTE — Telephone Encounter (Signed)
Tramadol faxed.

## 2016-11-03 ENCOUNTER — Ambulatory Visit (INDEPENDENT_AMBULATORY_CARE_PROVIDER_SITE_OTHER): Payer: BLUE CROSS/BLUE SHIELD | Admitting: Family

## 2016-11-03 ENCOUNTER — Encounter: Payer: Self-pay | Admitting: Family

## 2016-11-03 ENCOUNTER — Ambulatory Visit (HOSPITAL_COMMUNITY)
Admission: RE | Admit: 2016-11-03 | Discharge: 2016-11-03 | Disposition: A | Discharge status: Home / Self Care | Payer: BLUE CROSS/BLUE SHIELD | Source: Ambulatory Visit | Attending: Vascular Surgery | Admitting: Vascular Surgery

## 2016-11-03 VITALS — BP 103/62 | HR 74 | Temp 97.6°F | Resp 20 | Wt 200.0 lb

## 2016-11-03 DIAGNOSIS — I6523 Occlusion and stenosis of bilateral carotid arteries: Secondary | ICD-10-CM | POA: Insufficient documentation

## 2016-11-03 DIAGNOSIS — I6522 Occlusion and stenosis of left carotid artery: Secondary | ICD-10-CM

## 2016-11-03 DIAGNOSIS — F17200 Nicotine dependence, unspecified, uncomplicated: Secondary | ICD-10-CM

## 2016-11-03 LAB — VAS US CAROTID
LEFT ECA DIAS: -14 cm/s
Left CCA dist dias: -17 cm/s
Left CCA dist sys: -108 cm/s
Left CCA prox dias: 19 cm/s
Left CCA prox sys: 119 cm/s
Left ICA dist dias: -15 cm/s
Left ICA dist sys: -83 cm/s
Left ICA prox dias: -129 cm/s
Left ICA prox sys: -490 cm/s
RIGHT CCA MID DIAS: 24 cm/s
RIGHT ECA DIAS: -13 cm/s
Right CCA prox dias: 20 cm/s
Right CCA prox sys: 122 cm/s
Right cca dist sys: -86 cm/s

## 2016-11-03 NOTE — Progress Notes (Signed)
Chief Complaint: Follow up Extracranial Carotid Artery Stenosis   History of Present Illness  Tommy May is a 55 y.o. male who returns for follow up of his known extracranial carotid artery stenosis.  Patient has not had previous carotid artery intervention.  He denies any known history of stroke or TIA. Specifically he denies a history of amaurosis fugax or monocular blindness, unilateral facial drooping, hemiplegia, or receptive or expressive aphasia.    He has seen Dr. Stanford Breed in the past for evaluation of tunnel vision, wore a Holter monitor.  He has OSA and uses CPAP nightly.  He has a family history of stroke both father and grandfather, and a family history of dyslipidemia.  He is also a smoker.   The patient denies New Medical or Surgical History.  Pt Diabetic: no Pt smoker: smoker  (1/2 ppd, started at age 57 yrs)  Pt meds include: Statin : yes ASA: no, states used to, he did not stop taking on medical advice Other anticoagulants/antiplatelets: no   Past Medical History:  Diagnosis Date  . Allergy   . Environmental and seasonal allergies   . GERD (gastroesophageal reflux disease)   . Heart palpitations   . History of Holter monitoring   . Hyperlipidemia   . Hypertension    does not take meds  . Nerve pain    neck; takes gabapentin  . Sleep apnea    wears CPAP nightly    Social History Social History  Substance Use Topics  . Smoking status: Current Every Day Smoker    Packs/day: 0.75    Years: 43.00    Types: Cigarettes  . Smokeless tobacco: Never Used  . Alcohol use No    Family History Family History  Problem Relation Age of Onset  . Stroke Father 3  . Diabetes Father   . Diabetes Paternal Grandmother   . Stroke Paternal Grandfather 20    guess early 47's  . Heart attack Maternal Grandfather     Surgical History Past Surgical History:  Procedure Laterality Date  . dental implants    . INGUINAL HERNIA REPAIR Left 08/25/2014   Procedure: LEFT INGUINAL HERNIA REPAIR REMOVAL SPERMATIC CORD MASS;  Surgeon: Jackolyn Confer, MD;  Location: [facility];  Service: General;  Laterality: Left;  . INSERTION OF MESH N/A 08/25/2014   Procedure: INSERTION OF MESH;  Surgeon: Jackolyn Confer, MD;  Location: [facility];  Service: General;  Laterality: N/A;  . ROTATOR CUFF REPAIR Left     Allergies  Allergen Reactions  . Atorvastatin     Muscle pain    Current Outpatient Prescriptions  Medication Sig Dispense Refill  . acetaminophen (TYLENOL) 500 MG tablet Take 500 mg by mouth every 8 (eight) hours as needed for moderate pain.    Marland Kitchen aspirin EC 81 MG tablet Take 1 tablet (81 mg total) by mouth daily. 90 tablet 3  . esomeprazole (NEXIUM) 40 MG capsule Take 40 mg by mouth daily.     Marland Kitchen ezetimibe (ZETIA) 10 MG tablet Take 1 tablet (10 mg total) by mouth daily. 30 tablet 6  . gabapentin (NEURONTIN) 300 MG capsule TAKE ONE CAPSULE BY MOUTH AT BEDTIME 90 capsule 1  . loratadine (CLARITIN) 10 MG tablet Take 10 mg by mouth daily as needed for allergies.    . metoprolol succinate (TOPROL-XL) 25 MG 24 hr tablet Take 25 mg by mouth daily.    . Multiple Vitamin (MULTIVITAMIN WITH MINERALS) TABS tablet Take 1 tablet by mouth daily.    Marland Kitchen  traMADol (ULTRAM) 50 MG tablet TAKE 1 TABLET TWICE A DAY AS NEEDED FOR MODERATE PAIN 60 tablet 1  . lisinopril (PRINIVIL,ZESTRIL) 10 MG tablet Take 1 tablet (10 mg total) by mouth daily. 30 tablet 12  . rosuvastatin (CRESTOR) 40 MG tablet Take 1 tablet (40 mg total) by mouth daily. 30 tablet 12   No current facility-administered medications for this visit.     Review of Systems : See HPI for pertinent positives and negatives.  Physical Examination  Vitals:   11/03/16 1538 11/03/16 1540  BP: 114/67 103/62  Pulse: 71 74  Resp: 20   Temp: 97.6 F (36.4 C)   SpO2: 95%   Weight: 200 lb (90.7 kg)    Body mass index is 28.7 kg/m.  General: WDWN male in NAD GAIT: normal Eyes: PERRLA Pulmonary:  Respirations  are non-labored, fair air movement, CTAB  Cardiac: regular rhythm, no detected murmur.  VASCULAR EXAM Carotid Bruits Right Left   Negative Positive    Aorta is not palpable. Radial pulses are 2+ palpable and equal.                                                                                                                            LE Pulses Right Left       POPLITEAL  no palpable   no palpable       POSTERIOR TIBIAL   palpable    palpable        DORSALIS PEDIS      ANTERIOR TIBIAL  palpable  Not palpable     Gastrointestinal: soft, nontender, BS WNL, no r/g, no palpable masses.  Musculoskeletal: No muscle atrophy/wasting. M/S 5/5 throughout, extremities without ischemic changes.  Neurologic: A&O X 3; Appropriate Affect, Speech is normal CN 2-12 intact, pain and light touch intact in extremities, Motor exam as listed above.   Assessment: Tommy May is a 55 y.o. male who has no history of stroke or TIA.   His atherosclerotic risk factors include active smoking x 49 years, OSA treated with CPAP, dyslipidemia, discontinuation of daily ASA not on medical advise, and paternal family history of stroke.    DATA Carotid duplex today suggests no significant stenosis of the bilateral ECA or CCA. <40% stenosis of the right proximal ICA. 80-99% stenosis of the left ICA.  Bilateral vertebral artery flow is antegrade.  Bilateral subclavian artery waveforms are normal.  Right ICA remains stable, increase in the left ICA stenosis compared to the last exam on 04-27-16.    Plan:  Resume daily 81 mg ASA.   Cardiac risk stratification, referral to Dr. Stanford Breed, prior to left CEA.  The patient was counseled re smoking cessation and given several free resources re smoking cessation.  Follow-up with Dr. Bridgett Larsson after cardiac risk stratification.    I discussed in depth with the patient the nature of atherosclerosis, and emphasized the importance of maximal medical management  including strict control of blood pressure, blood  glucose, and lipid levels, obtaining regular exercise, and cessation of smoking.  The patient is aware that without maximal medical management the underlying atherosclerotic disease process will progress, limiting the benefit of any interventions. The patient was given information about stroke prevention and what symptoms should prompt the patient to seek immediate medical care. Thank you for allowing [place] to participate in this patient's care.  Clemon Chambers, RN, MSN, FNP-C [facility] Office: [phone]  Clinic Physician: Bridgett Larsson  11/03/16 4:11 PM

## 2016-11-03 NOTE — Patient Instructions (Signed)
Stroke Prevention Some medical conditions and behaviors are associated with an increased chance of having a stroke. You may prevent a stroke by making healthy choices and managing medical conditions. How can I reduce my risk of having a stroke?  Stay physically active. Get at least 30 minutes of activity on most or all days.  Do not smoke. It may also be helpful to avoid exposure to secondhand smoke.  Limit alcohol use. Moderate alcohol use is considered to be:  No more than 2 drinks per day for men.  No more than 1 drink per day for nonpregnant women.  Eat healthy foods. This involves:  Eating 5 or more servings of fruits and vegetables a day.  Making dietary changes that address high blood pressure (hypertension), high cholesterol, diabetes, or obesity.  Manage your cholesterol levels.  Making food choices that are high in fiber and low in saturated fat, trans fat, and cholesterol may control cholesterol levels.  Take any prescribed medicines to control cholesterol as directed by your health care provider.  Manage your diabetes.  Controlling your carbohydrate and sugar intake is recommended to manage diabetes.  Take any prescribed medicines to control diabetes as directed by your health care provider.  Control your hypertension.  Making food choices that are low in salt (sodium), saturated fat, trans fat, and cholesterol is recommended to manage hypertension.  Ask your health care provider if you need treatment to lower your blood pressure. Take any prescribed medicines to control hypertension as directed by your health care provider.  If you are 18-39 years of age, have your blood pressure checked every 3-5 years. If you are 40 years of age or older, have your blood pressure checked every year.  Maintain a healthy weight.  Reducing calorie intake and making food choices that are low in sodium, saturated fat, trans fat, and cholesterol are recommended to manage  weight.  Stop drug abuse.  Avoid taking birth control pills.  Talk to your health care provider about the risks of taking birth control pills if you are over 35 years old, smoke, get migraines, or have ever had a blood clot.  Get evaluated for sleep disorders (sleep apnea).  Talk to your health care provider about getting a sleep evaluation if you snore a lot or have excessive sleepiness.  Take medicines only as directed by your health care provider.  For some people, aspirin or blood thinners (anticoagulants) are helpful in reducing the risk of forming abnormal blood clots that can lead to stroke. If you have the irregular heart rhythm of atrial fibrillation, you should be on a blood thinner unless there is a good reason you cannot take them.  Understand all your medicine instructions.  Make sure that other conditions (such as anemia or atherosclerosis) are addressed. Get help right away if:  You have sudden weakness or numbness of the face, arm, or leg, especially on one side of the body.  Your face or eyelid droops to one side.  You have sudden confusion.  You have trouble speaking (aphasia) or understanding.  You have sudden trouble seeing in one or both eyes.  You have sudden trouble walking.  You have dizziness.  You have a loss of balance or coordination.  You have a sudden, severe headache with no known cause.  You have new chest pain or an irregular heartbeat. Any of these symptoms may represent a serious problem that is an emergency. Do not wait to see if the symptoms will go away.   Get medical help at once. Call your local emergency services (911 in [place] Do not drive yourself to the hospital.  This information is not intended to replace advice given to you by your health care provider. Make sure you discuss any questions you have with your health care provider. Document Released: 12/15/2004 Document Revised: 04/14/2016 Document Reviewed: 05/10/2013 Elsevier  Interactive Patient Education  2017 Elsevier Inc.     Steps to Quit Smoking Smoking tobacco can be bad for your health. It can also affect almost every organ in your body. Smoking puts you and people around you at risk for many serious long-lasting (chronic) diseases. Quitting smoking is hard, but it is one of the best things that you can do for your health. It is never too late to quit. What are the benefits of quitting smoking? When you quit smoking, you lower your risk for getting serious diseases and conditions. They can include:  Lung cancer or lung disease.  Heart disease.  Stroke.  Heart attack.  Not being able to have children (infertility).  Weak bones (osteoporosis) and broken bones (fractures). If you have coughing, wheezing, and shortness of breath, those symptoms may get better when you quit. You may also get sick less often. If you are pregnant, quitting smoking can help to lower your chances of having a baby of low birth weight. What can I do to help me quit smoking? Talk with your doctor about what can help you quit smoking. Some things you can do (strategies) include:  Quitting smoking totally, instead of slowly cutting back how much you smoke over a period of time.  Going to in-person counseling. You are more likely to quit if you go to many counseling sessions.  Using resources and support systems, such as:  Online chats with a counselor.  Phone quitlines.  Printed self-help materials.  Support groups or group counseling.  Text messaging programs.  Mobile phone apps or applications.  Taking medicines. Some of these medicines may have nicotine in them. If you are pregnant or breastfeeding, do not take any medicines to quit smoking unless your doctor says it is okay. Talk with your doctor about counseling or other things that can help you. Talk with your doctor about using more than one strategy at the same time, such as taking medicines while you are  also going to in-person counseling. This can help make quitting easier. What things can I do to make it easier to quit? Quitting smoking might feel very hard at first, but there is a lot that you can do to make it easier. Take these steps:  Talk to your family and friends. Ask them to support and encourage you.  Call phone quitlines, reach out to support groups, or work with a counselor.  Ask people who smoke to not smoke around you.  Avoid places that make you want (trigger) to smoke, such as:  Bars.  Parties.  Smoke-break areas at work.  Spend time with people who do not smoke.  Lower the stress in your life. Stress can make you want to smoke. Try these things to help your stress:  Getting regular exercise.  Deep-breathing exercises.  Yoga.  Meditating.  Doing a body scan. To do this, close your eyes, focus on one area of your body at a time from head to toe, and notice which parts of your body are tense. Try to relax the muscles in those areas.  Download or buy apps on your mobile phone or tablet   that can help you stick to your quit plan. There are many free apps, such as QuitGuide from the CDC (Centers for Disease Control and Prevention). You can find more support from smokefree.gov and other websites. This information is not intended to replace advice given to you by your health care provider. Make sure you discuss any questions you have with your health care provider. Document Released: 09/03/2009 Document Revised: 07/05/2016 Document Reviewed: 03/24/2015 Elsevier Interactive Patient Education  2017 Elsevier Inc.  

## 2016-11-10 ENCOUNTER — Encounter: Payer: Self-pay | Admitting: Family

## 2016-11-22 ENCOUNTER — Telehealth: Payer: Self-pay | Admitting: *Deleted

## 2016-11-22 NOTE — Telephone Encounter (Signed)
Patient is needing clearance for carotid endarterectomy, Will forward for dr Stanford Breed review

## 2016-11-23 NOTE — Telephone Encounter (Signed)
Spoke with pt, Follow up scheduled 11/24/16 for clearance. Will fax this note to [facility].

## 2016-11-23 NOTE — Telephone Encounter (Signed)
PAOV for preop eval Kirk Ruths

## 2016-11-24 ENCOUNTER — Ambulatory Visit (INDEPENDENT_AMBULATORY_CARE_PROVIDER_SITE_OTHER): Payer: BLUE CROSS/BLUE SHIELD | Admitting: Cardiology

## 2016-11-24 ENCOUNTER — Encounter: Payer: Self-pay | Admitting: Cardiology

## 2016-11-24 VITALS — BP 128/70 | HR 76 | Ht 70.0 in | Wt 197.0 lb

## 2016-11-24 DIAGNOSIS — R002 Palpitations: Secondary | ICD-10-CM

## 2016-11-24 DIAGNOSIS — I779 Disorder of arteries and arterioles, unspecified: Secondary | ICD-10-CM | POA: Diagnosis not present

## 2016-11-24 DIAGNOSIS — Z01810 Encounter for preprocedural cardiovascular examination: Secondary | ICD-10-CM

## 2016-11-24 DIAGNOSIS — I739 Peripheral vascular disease, unspecified: Principal | ICD-10-CM

## 2016-11-24 MED ORDER — LOSARTAN POTASSIUM 50 MG PO TABS: 50.0000 mg | ORAL_TABLET | Freq: Every day | ORAL | 3 refills | Status: DC

## 2016-11-24 NOTE — Patient Instructions (Addendum)
Medication Instructions:  STOP lisinopril.  START taking Cozaar 50mg  (1 tablet) once daily.  Labwork: NONE  Testing/Procedures: NONE  Follow-Up: Your physician recommends that you schedule a follow-up appointment in: 1 year with Dr. Stanford Breed.   If you need a refill on your cardiac medications before your next appointment, please call your pharmacy.

## 2016-11-24 NOTE — Progress Notes (Signed)
HPI: FU hyperlipidemia and hypertension. Nuclear study September 2003 showed ejection fraction 72% and normal perfusion. Echocardiogram September 2016 showed normal LV systolic function and grade 1 diastolic dysfunction. Carotid Dopplers December 2017 showed 80-99% left and 1-39% right stenosis. He will require carotid endarterectomy. Cardiology asked to evaluate. Since last seen, the patient denies any dyspnea on exertion, orthopnea, PND, pedal edema, palpitations, syncope or chest pain.   Current Outpatient Prescriptions  Medication Sig Dispense Refill  . acetaminophen (TYLENOL) 500 MG tablet Take 500 mg by mouth every 8 (eight) hours as needed for moderate pain.    Marland Kitchen aspirin EC 81 MG tablet Take 1 tablet (81 mg total) by mouth daily. 90 tablet 3  . esomeprazole (NEXIUM) 40 MG capsule Take 40 mg by mouth daily.     Marland Kitchen ezetimibe (ZETIA) 10 MG tablet Take 1 tablet (10 mg total) by mouth daily. 30 tablet 6  . gabapentin (NEURONTIN) 300 MG capsule TAKE ONE CAPSULE BY MOUTH AT BEDTIME 90 capsule 1  . loratadine (CLARITIN) 10 MG tablet Take 10 mg by mouth daily as needed for allergies.    . metoprolol succinate (TOPROL-XL) 25 MG 24 hr tablet Take 25 mg by mouth daily.    . Multiple Vitamin (MULTIVITAMIN WITH MINERALS) TABS tablet Take 1 tablet by mouth daily.    . traMADol (ULTRAM) 50 MG tablet TAKE 1 TABLET TWICE A DAY AS NEEDED FOR MODERATE PAIN 60 tablet 1  . lisinopril (PRINIVIL,ZESTRIL) 10 MG tablet Take 1 tablet (10 mg total) by mouth daily. 30 tablet 12  . rosuvastatin (CRESTOR) 40 MG tablet Take 1 tablet (40 mg total) by mouth daily. 30 tablet 12   No current facility-administered medications for this visit.      Past Medical History:  Diagnosis Date  . Allergy   . Environmental and seasonal allergies   . GERD (gastroesophageal reflux disease)   . Heart palpitations   . History of Holter monitoring   . Hyperlipidemia   . Hypertension    does not take meds  . Nerve pain    neck; takes gabapentin  . Sleep apnea    wears CPAP nightly    Past Surgical History:  Procedure Laterality Date  . dental implants    . INGUINAL HERNIA REPAIR Left 08/25/2014   Procedure: LEFT INGUINAL HERNIA REPAIR REMOVAL SPERMATIC CORD MASS;  Surgeon: Jackolyn Confer, MD;  Location: [facility];  Service: General;  Laterality: Left;  . INSERTION OF MESH N/A 08/25/2014   Procedure: INSERTION OF MESH;  Surgeon: Jackolyn Confer, MD;  Location: [facility];  Service: General;  Laterality: N/A;  . ROTATOR CUFF REPAIR Left     Social History   Social History  . Marital status: Married    Spouse name: N/A  . Number of children: 2  . Years of education: 12   Occupational History  . purchasing    Social History Main Topics  . Smoking status: Current Every Day Smoker    Packs/day: 0.75    Years: 43.00    Types: Cigarettes  . Smokeless tobacco: Never Used  . Alcohol use No  . Drug use: No  . Sexual activity: Not on file   Other Topics Concern  . Not on file   Social History Narrative   Pt lives with his wife and two children.  Graduated high school.    Family History  Problem Relation Age of Onset  . Stroke Father 28  . Diabetes Father   .  Diabetes Paternal Grandmother   . Stroke Paternal Grandfather 61    guess early 12's  . Heart attack Maternal Grandfather     ROS: no fevers or chills, productive cough, hemoptysis, dysphasia, odynophagia, melena, hematochezia, dysuria, hematuria, rash, seizure activity, orthopnea, PND, pedal edema, claudication. Remaining systems are negative.  Physical Exam: Well-developed well-nourished in no acute distress.  Skin is warm and dry.  HEENT is normal.  Neck is supple.  Chest is clear to auscultation with normal expansion.  Cardiovascular exam is regular rate and rhythm.  Abdominal exam nontender or distended. No masses palpated. Extremities show no edema. neuro grossly intact  ECG- Sinus rhythm at a rate of 76. No ST  changes.  A/P  1 Hypertension-blood pressure controlled. However he is complaining of a cough. Discontinue lisinopril and treat with Cozaar 50 mg daily.  2 hyperlipidemia-continue present medications.   3 tobacco abuse-patient counseled on discontinuing.  4 carotid artery disease-continue aspirin and statin.  5 preoperative evaluation prior to carotid endarterectomy- patient has excellent functional capacity. He can walk 2 miles without having any chest pain or dyspnea. He can easily climb 2 flights of stairs with no symptoms. Electrocardiogram normal. No plans for further testing preoperatively.   Kirk Ruths, MD

## 2016-12-19 ENCOUNTER — Encounter: Payer: Self-pay | Admitting: Vascular Surgery

## 2016-12-21 NOTE — Progress Notes (Signed)
Established Carotid Patient  History of Present Illness  Tommy May is a 56 y.o. (05-13-1961) male who presents with chief complaint: no recent visual changes.  Previous carotid studies demonstrated: RICA [id number] stenosis, LICA 123456 stenosis.  Patient has no history of TIA or stroke symptom.  The patient has never had amaurosis fugax or monocular blindness.  The patient had one episode that was suggestive of central hemianopsia.  The patient has never had facial drooping or hemiplegia.  The patient has never had receptive or expressive aphasia.   The patient was sent to Cardiology for preop risk stratification and optimization.  He has been cleared to proceed with CEA.  Past Medical History:  Diagnosis Date  . Allergy   . Environmental and seasonal allergies   . GERD (gastroesophageal reflux disease)   . Heart palpitations   . History of Holter monitoring   . Hyperlipidemia   . Hypertension    does not take meds  . Nerve pain    neck; takes gabapentin  . Sleep apnea    wears CPAP nightly    Past Surgical History:  Procedure Laterality Date  . dental implants    . INGUINAL HERNIA REPAIR Left 08/25/2014   Procedure: LEFT INGUINAL HERNIA REPAIR REMOVAL SPERMATIC CORD MASS;  Surgeon: Jackolyn Confer, MD;  Location: [facility];  Service: General;  Laterality: Left;  . INSERTION OF MESH N/A 08/25/2014   Procedure: INSERTION OF MESH;  Surgeon: Jackolyn Confer, MD;  Location: [facility];  Service: General;  Laterality: N/A;  . ROTATOR CUFF REPAIR Left     Social History   Social History  . Marital status: Married    Spouse name: N/A  . Number of children: 2  . Years of education: 12   Occupational History  . purchasing    Social History Main Topics  . Smoking status: Current Every Day Smoker    Packs/day: 0.75    Years: 43.00    Types: Cigarettes  . Smokeless tobacco: Never Used  . Alcohol use No  . Drug use: No  . Sexual activity: Not on file   Other Topics Concern  .  Not on file   Social History Narrative   Pt lives with his wife and two children.  Graduated high school.    Family History  Problem Relation Age of Onset  . Stroke Father 51  . Diabetes Father   . Diabetes Paternal Grandmother   . Stroke Paternal Grandfather 67    guess early 51's  . Heart attack Maternal Grandfather     Current Outpatient Prescriptions  Medication Sig Dispense Refill  . acetaminophen (TYLENOL) 500 MG tablet Take 500 mg by mouth every 8 (eight) hours as needed for moderate pain.    Marland Kitchen aspirin EC 81 MG tablet Take 1 tablet (81 mg total) by mouth daily. 90 tablet 3  . esomeprazole (NEXIUM) 40 MG capsule Take 40 mg by mouth daily.     Marland Kitchen ezetimibe (ZETIA) 10 MG tablet Take 1 tablet (10 mg total) by mouth daily. 30 tablet 6  . gabapentin (NEURONTIN) 300 MG capsule TAKE ONE CAPSULE BY MOUTH AT BEDTIME 90 capsule 1  . loratadine (CLARITIN) 10 MG tablet Take 10 mg by mouth daily as needed for allergies.    Marland Kitchen losartan (COZAAR) 50 MG tablet Take 1 tablet (50 mg total) by mouth daily. 90 tablet 3  . metoprolol succinate (TOPROL-XL) 25 MG 24 hr tablet Take 25 mg by mouth daily.    Marland Kitchen  Multiple Vitamin (MULTIVITAMIN WITH MINERALS) TABS tablet Take 1 tablet by mouth daily.    . rosuvastatin (CRESTOR) 40 MG tablet Take 1 tablet (40 mg total) by mouth daily. 30 tablet 12  . traMADol (ULTRAM) 50 MG tablet TAKE 1 TABLET TWICE A DAY AS NEEDED FOR MODERATE PAIN 60 tablet 1   No current facility-administered medications for this visit.      Allergies  Allergen Reactions  . Atorvastatin     Muscle pain    REVIEW OF SYSTEMS:  (Positives checked otherwise negative)  CARDIOVASCULAR:   [ ]  chest pain,  [ ]  chest pressure,  [ ]  palpitations,  [ ]  shortness of breath when laying flat,  [ ]  shortness of breath with exertion,   [ ]  pain in feet when walking,  [ ]  pain in feet when laying flat, [ ]  history of blood clot in veins (DVT),  [ ]  history of phlebitis,  [ ]  swelling in  legs,  [ ]  varicose veins  PULMONARY:   [ ]  productive cough,  [ ]  asthma,  [ ]  wheezing  NEUROLOGIC:   [ ]  weakness in arms or legs,  [ ]  numbness in arms or legs,  [ ]  difficulty speaking or slurred speech,  [x]  temporary loss of vision in one eye,  [ ]  dizziness  HEMATOLOGIC:   [ ]  bleeding problems,  [ ]  problems with blood clotting too easily  MUSCULOSKEL:   [ ]  joint pain, [ ]  joint swelling  GASTROINTEST:   [ ]  vomiting blood,  [ ]  blood in stool     GENITOURINARY:   [ ]  burning with urination,  [ ]  blood in urine  PSYCHIATRIC:   [ ]  history of major depression  INTEGUMENTARY:   [ ]  rashes,  [ ]  ulcers  CONSTITUTIONAL:   [ ]  fever,  [ ]  chills   Physical Examination  Vitals:   12/23/16 1538  BP: (!) 146/92  Pulse: 87  Resp: 16  Temp: 98.8 F (37.1 C)  TempSrc: Oral  SpO2: 95%  Weight: 201 lb (91.2 kg)  Height: 5\' 10"  (1.778 m)    Body mass index is 28.84 kg/m.  General: Alert, O x 3, WD,NAD  Eyes: PERRLA, EOMI,   Neck: Supple, mid-line trachea,    Pulmonary: Sym exp, good B air movt,CTA B  Cardiac: RRR, Nl S1, S2, no Murmurs, No rubs, No S3,S4  Vascular: Vessel Right Left  Radial Palpable Palpable  Brachial Palpable Palpable  Carotid Palpable, No Bruit Palpable, No Bruit  Aorta Not palpable N/A  Femoral Palpable Palpable  Popliteal Not palpable Not palpable  PT Palpable Palpable  DP Palpable Palpable   Gastrointestinal: soft, non-distended, non-tender to palpation, No guarding or rebound, no HSM, no masses, no CVAT B, No palpable prominent aortic pulse,    Musculoskeletal: M/S 5/5 throughout  , Extremities without ischemic changes  , No edema present,  , No LDS present  Neurologic: CN 2-12 intact , Pain and light touch intact in extremities , Motor exam as listed above   Non-Invasive Vascular Imaging  CAROTID DUPLEX (Date: 11/03/16):   R ICA stenosis: 1-39%  R VA: patent and antegrade  L ICA stenosis: 80-99%  L  VA: patent and antegrade   Medical Decision Making  Tommy May is a 56 y.o. male who presents with: asx L ICA stenosis >80%, asx R ICA stenosis <50%   Based on the patient's vascular studies and examination, I have offered the patient:  L CEA. I discussed with the patient the risks, benefits, and alternatives to carotid endarterectomy.   The patient is aware that the risks of carotid endarterectomy include but are not limited to: bleeding, infection, stroke, myocardial infarction, death, cranial nerve injuries both temporary and permanent, neck hematoma, possible airway compromise, labile blood pressure post-operatively, cerebral hyperperfusion syndrome, and possible need for additional interventions in the future.  The patient is aware of the risks and agrees to proceed forward with the procedure.  He is schedule for the 22 FEB 18  I discussed in depth with the patient the nature of atherosclerosis, and emphasized the importance of maximal medical management including strict control of blood pressure, blood glucose, and lipid levels, antiplatelet agents, obtaining regular exercise, and cessation of smoking.    The patient is aware that without maximal medical management the underlying atherosclerotic disease process will progress, limiting the benefit of any interventions. The patient is currently on a statin:  Crestor. The patient is currently on an anti-platelet: ASA.  Thank you for allowing [place] to participate in this patient's care.   Adele Barthel, MD, FACS [facility] Office: [phone] Pager: [phone]

## 2016-12-23 ENCOUNTER — Encounter: Payer: Self-pay | Admitting: Vascular Surgery

## 2016-12-23 ENCOUNTER — Ambulatory Visit (INDEPENDENT_AMBULATORY_CARE_PROVIDER_SITE_OTHER): Payer: BLUE CROSS/BLUE SHIELD | Admitting: Vascular Surgery

## 2016-12-23 VITALS — BP 146/92 | HR 87 | Temp 98.8°F | Resp 16 | Ht 70.0 in | Wt 201.0 lb

## 2016-12-23 DIAGNOSIS — I779 Disorder of arteries and arterioles, unspecified: Secondary | ICD-10-CM | POA: Diagnosis not present

## 2016-12-23 DIAGNOSIS — I739 Peripheral vascular disease, unspecified: Principal | ICD-10-CM

## 2016-12-26 ENCOUNTER — Other Ambulatory Visit: Payer: Self-pay | Admitting: Family

## 2016-12-26 NOTE — Telephone Encounter (Signed)
Last refill was 10/31/16 

## 2016-12-27 NOTE — Telephone Encounter (Signed)
Faxed

## 2017-01-02 ENCOUNTER — Other Ambulatory Visit: Payer: Self-pay

## 2017-01-03 ENCOUNTER — Other Ambulatory Visit: Payer: Self-pay | Admitting: Vascular Surgery

## 2017-01-04 ENCOUNTER — Other Ambulatory Visit: Payer: Self-pay | Admitting: Family

## 2017-01-06 ENCOUNTER — Encounter (HOSPITAL_COMMUNITY)
Admission: RE | Admit: 2017-01-06 | Discharge: 2017-01-06 | Disposition: A | Discharge status: Home / Self Care | Payer: BLUE CROSS/BLUE SHIELD | Source: Ambulatory Visit | Attending: Vascular Surgery | Admitting: Vascular Surgery

## 2017-01-06 ENCOUNTER — Encounter (HOSPITAL_COMMUNITY): Payer: Self-pay

## 2017-01-06 DIAGNOSIS — Z01812 Encounter for preprocedural laboratory examination: Secondary | ICD-10-CM | POA: Insufficient documentation

## 2017-01-06 LAB — COMPREHENSIVE METABOLIC PANEL
ALT: 32 U/L (ref 17–63)
AST: 28 U/L (ref 15–41)
Albumin: 3.2 g/dL — ABNORMAL LOW (ref 3.5–5.0)
Alkaline Phosphatase: 60 U/L (ref 38–126)
Anion gap: 10 (ref 5–15)
BUN: 9 mg/dL (ref 6–20)
CO2: 23 mmol/L (ref 22–32)
Calcium: 8.9 mg/dL (ref 8.9–10.3)
Chloride: 105 mmol/L (ref 101–111)
Creatinine, Ser: 0.85 mg/dL (ref 0.61–1.24)
GFR calc Af Amer: >60 mL/min (ref >60)
GFR calc non Af Amer: >60 mL/min (ref >60)
Glucose, Bld: 151 mg/dL — ABNORMAL HIGH (ref 65–99)
Potassium: 3.8 mmol/L (ref 3.5–5.1)
Sodium: 138 mmol/L (ref 135–145)
Total Bilirubin: 0.9 mg/dL (ref 0.3–1.2)
Total Protein: 5.9 g/dL — ABNORMAL LOW (ref 6.5–8.1)

## 2017-01-06 LAB — ABO/RH: ABO/RH(D): A POS

## 2017-01-06 LAB — CBC
HCT: 47.3 % (ref 39.0–52.0)
Hemoglobin: 16.3 g/dL (ref 13.0–17.0)
MCH: 30 pg (ref 26.0–34.0)
MCHC: 34.5 g/dL (ref 30.0–36.0)
MCV: 87.1 fL (ref 78.0–100.0)
Platelets: 165 10*3/uL (ref 150–400)
RBC: 5.43 MIL/uL (ref 4.22–5.81)
RDW: 12.5 % (ref 11.5–15.5)
WBC: 7 10*3/uL (ref 4.0–10.5)

## 2017-01-06 LAB — URINALYSIS, ROUTINE W REFLEX MICROSCOPIC
Bilirubin Urine: NEGATIVE
Glucose, UA: NEGATIVE mg/dL
Hgb urine dipstick: NEGATIVE
Ketones, ur: NEGATIVE mg/dL
Leukocytes, UA: NEGATIVE
Nitrite: NEGATIVE
Protein, ur: NEGATIVE mg/dL
Specific Gravity, Urine: 1.005 (ref 1.005–1.030)
pH: 7 (ref 5.0–8.0)

## 2017-01-06 LAB — TYPE AND SCREEN
ABO/RH(D): A POS
Antibody Screen: NEGATIVE

## 2017-01-06 LAB — PROTIME-INR
INR: 0.91
Prothrombin Time: 12.3 seconds (ref 11.4–15.2)

## 2017-01-06 LAB — SURGICAL PCR SCREEN
MRSA, PCR: NEGATIVE
Staphylococcus aureus: NEGATIVE

## 2017-01-06 LAB — APTT: aPTT: 30 seconds (ref 24–36)

## 2017-01-06 NOTE — Progress Notes (Signed)
PCP - Ethelda Chick Cardiologist - [place]  Chest x-ray - not needed EKG - 11/24/16 Stress Test -> 10years  ECHO - 07/31/15 Cardiac Cath -denies   Sleep Study - 06/17/14 CPAP - wears at night  Abnormal EKG sending to anesthesia  Patient denies shortness of breath, fever, cough and chest pain at PAT appointment   Patient verbalized understanding of instructions that was given to them at the PAT appointment. Patient expressed that there were no further questions.  Patient was also instructed that they will need to review over the PAT instructions again at home before the surgery.

## 2017-01-06 NOTE — Pre-Procedure Instructions (Signed)
CALLAHAN MONDRY  01/06/2017      [id number] - [place], [place] [address] AT [address] [place] [address] Phone: [phone] Fax: [phone]  [place], [address] [place] [address] Phone: [phone] Fax: [phone]    Your procedure is scheduled on February 22  Report to [facility] at 1000 A.M.  Call this number if you have problems the morning of surgery:  [phone]   Remember:  Do not eat food or drink liquids after midnight.   Take these medicines the morning of surgery with A SIP OF WATER acetaminophen (TYLENOL),  esomeprazole (NEXIUM), loratadine (CLARITIN), metoprolol succinate (TOPROL-XL), traMADol (ULTRAM) if needed  7 days prior to surgery STOP taking any Aleve, Naproxen, Ibuprofen, Motrin, Advil, Goody's, BC's, all herbal medications, fish oil, and all vitamins    Do not wear jewelry.  Do not wear lotions, powders, or cologne, or deoderant.  Men may shave face and neck.  Do not bring valuables to the hospital.  [facility] is not responsible for any belongings or valuables.  Contacts, dentures or bridgework may not be worn into surgery.  Leave your suitcase in the car.  After surgery it may be brought to your room.  For patients admitted to the hospital, discharge time will be determined by your treatment team.  Patients discharged the day of surgery will not be allowed to drive home.    Special instructions:   [facility]- Preparing For Surgery  Before surgery, you can play an important role. Because skin is not sterile, your skin needs to be as free of germs as possible. You can reduce the number of germs on your skin by washing with CHG (chlorahexidine gluconate) Soap before surgery.  CHG is an antiseptic cleaner which kills germs and bonds with the skin to continue killing germs even  after washing.  Please do not use if you have an allergy to CHG or antibacterial soaps. If your skin becomes reddened/irritated stop using the CHG.  Do not shave (including legs and underarms) for at least 48 hours prior to first CHG shower. It is OK to shave your face.  Please follow these instructions carefully.   1. Shower the NIGHT BEFORE SURGERY and the MORNING OF SURGERY with CHG.   2. If you chose to wash your hair, wash your hair first as usual with your normal shampoo.  3. After you shampoo, rinse your hair and body thoroughly to remove the shampoo.  4. Use CHG as you would any other liquid soap. You can apply CHG directly to the skin and wash gently with a scrungie or a clean washcloth.   5. Apply the CHG Soap to your body ONLY FROM THE NECK DOWN.  Do not use on open wounds or open sores. Avoid contact with your eyes, ears, mouth and genitals (private parts). Wash genitals (private parts) with your normal soap.  6. Wash thoroughly, paying special attention to the area where your surgery will be performed.  7. Thoroughly rinse your body with warm water from the neck down.  8. DO NOT shower/wash with your normal soap after using and rinsing off the CHG Soap.  9. Pat yourself dry with a CLEAN TOWEL.   10. Wear CLEAN PAJAMAS   11. Place CLEAN SHEETS on your bed the night of your first shower and DO NOT SLEEP WITH  PETS.    Day of Surgery: Do not apply any deodorants/lotions. Please wear clean clothes to the hospital/surgery center.      Please read over the following fact sheets that you were given.

## 2017-01-09 NOTE — Progress Notes (Signed)
Anesthesia Chart Review:  Pt is a 56 year old male scheduled for L CEA on 01/12/2017 with Adele Barthel, MD.   - PCP is Mauricio Po, FNP - Cardiologist is Kirk Ruths, MD, who cleared pt for surgery at last office visit 11/24/16.    PMH includes:  HTN, OSA, hyperlipidemia, GERD. Current smoker. BMI 29. S/p L inguinal hernia repair 08/25/14.   Medications include: ASA 81mg , nexium, zetia, losartan, metoprolol, crestor.   Preoperative labs reviewed.    EKG 11/24/16: Sinus rhythm at a rate of 76. No ST changes.  Carotid duplex 11/03/16:  1. 1-39% R proximal ICA stenosis 2. 80-99% L ICA stenosis  Echo 07/31/15:  - Left ventricle: The cavity size was normal. Wall thickness was increased in a pattern of mild LVH. Systolic function was normal. The estimated ejection fraction was in the range of 55% to 60%. Doppler parameters are consistent with abnormal left ventricular relaxation (grade 1 diastolic dysfunction).  If no changes, I anticipate pt can proceed with surgery as scheduled.   Willeen Cass, FNP-BC [facility]/Anesthesiology Phone: [phone] 01/09/2017 3:04 PM

## 2017-01-12 ENCOUNTER — Encounter (HOSPITAL_COMMUNITY)
Admission: RE | Disposition: A | Discharge status: Home / Self Care | Payer: Self-pay | Source: Ambulatory Visit | Attending: Vascular Surgery

## 2017-01-12 ENCOUNTER — Encounter (HOSPITAL_COMMUNITY): Payer: Self-pay | Admitting: *Deleted

## 2017-01-12 ENCOUNTER — Inpatient Hospital Stay (HOSPITAL_COMMUNITY)
Admission: RE | Admit: 2017-01-12 | Discharge: 2017-01-14 | DRG: 039 | Disposition: A | Discharge status: Home / Self Care | Payer: BLUE CROSS/BLUE SHIELD | Source: Ambulatory Visit | Attending: Vascular Surgery | Admitting: Vascular Surgery

## 2017-01-12 ENCOUNTER — Inpatient Hospital Stay (HOSPITAL_COMMUNITY): Payer: BLUE CROSS/BLUE SHIELD | Admitting: Emergency Medicine

## 2017-01-12 ENCOUNTER — Inpatient Hospital Stay (HOSPITAL_COMMUNITY): Payer: BLUE CROSS/BLUE SHIELD | Admitting: Certified Registered Nurse Anesthetist

## 2017-01-12 DIAGNOSIS — I6522 Occlusion and stenosis of left carotid artery: Secondary | ICD-10-CM | POA: Diagnosis present

## 2017-01-12 DIAGNOSIS — Z7982 Long term (current) use of aspirin: Secondary | ICD-10-CM | POA: Diagnosis not present

## 2017-01-12 DIAGNOSIS — R110 Nausea: Secondary | ICD-10-CM | POA: Diagnosis not present

## 2017-01-12 DIAGNOSIS — F17210 Nicotine dependence, cigarettes, uncomplicated: Secondary | ICD-10-CM | POA: Diagnosis present

## 2017-01-12 DIAGNOSIS — Z823 Family history of stroke: Secondary | ICD-10-CM | POA: Diagnosis not present

## 2017-01-12 DIAGNOSIS — Z8249 Family history of ischemic heart disease and other diseases of the circulatory system: Secondary | ICD-10-CM

## 2017-01-12 DIAGNOSIS — I739 Peripheral vascular disease, unspecified: Secondary | ICD-10-CM | POA: Diagnosis present

## 2017-01-12 DIAGNOSIS — Z833 Family history of diabetes mellitus: Secondary | ICD-10-CM | POA: Diagnosis not present

## 2017-01-12 DIAGNOSIS — K219 Gastro-esophageal reflux disease without esophagitis: Secondary | ICD-10-CM | POA: Diagnosis present

## 2017-01-12 DIAGNOSIS — E785 Hyperlipidemia, unspecified: Secondary | ICD-10-CM | POA: Diagnosis present

## 2017-01-12 DIAGNOSIS — I10 Essential (primary) hypertension: Secondary | ICD-10-CM | POA: Diagnosis present

## 2017-01-12 DIAGNOSIS — R070 Pain in throat: Secondary | ICD-10-CM | POA: Diagnosis not present

## 2017-01-12 HISTORY — PX: ENDARTERECTOMY: SHX5162

## 2017-01-12 HISTORY — PX: PATCH ANGIOPLASTY: SHX6230

## 2017-01-12 SURGERY — ENDARTERECTOMY, CAROTID
Anesthesia: General | Site: Neck | Laterality: Left

## 2017-01-12 MED ORDER — VITAMIN B-12 1000 MCG PO TABS
1000.0000 ug | ORAL_TABLET | Freq: Every day | ORAL | Status: DC
  Filled 2017-01-12: qty 1

## 2017-01-12 MED ORDER — TRAMADOL HCL 50 MG PO TABS
50.0000 mg | ORAL_TABLET | Freq: Four times a day (QID) | ORAL | Status: DC
  Administered 2017-01-12 – 2017-01-14 (×5): 50 mg via ORAL
  Filled 2017-01-12 (×5): qty 1

## 2017-01-12 MED ORDER — HEPARIN SODIUM (PORCINE) 1000 UNIT/ML IJ SOLN
INTRAMUSCULAR | Status: AC
  Filled 2017-01-12: qty 1

## 2017-01-12 MED ORDER — CHLORHEXIDINE GLUCONATE CLOTH 2 % EX PADS: 6.0000 | MEDICATED_PAD | Freq: Once | CUTANEOUS | Status: DC

## 2017-01-12 MED ORDER — PHENYLEPHRINE 40 MCG/ML (10ML) SYRINGE FOR IV PUSH (FOR BLOOD PRESSURE SUPPORT)
PREFILLED_SYRINGE | INTRAVENOUS | Status: DC | PRN
  Administered 2017-01-12: 120 ug via INTRAVENOUS

## 2017-01-12 MED ORDER — BISACODYL 10 MG RE SUPP: 10.0000 mg | Freq: Every day | RECTAL | Status: DC | PRN

## 2017-01-12 MED ORDER — DEXTROSE 5 % IV SOLN
INTRAVENOUS | Status: AC
  Filled 2017-01-12: qty 1.5

## 2017-01-12 MED ORDER — POTASSIUM CHLORIDE CRYS ER 20 MEQ PO TBCR: 20.0000 meq | EXTENDED_RELEASE_TABLET | Freq: Every day | ORAL | Status: DC | PRN

## 2017-01-12 MED ORDER — DEXTROSE 5 % IV SOLN
INTRAVENOUS | Status: DC | PRN
  Administered 2017-01-12: 40 ug/min via INTRAVENOUS

## 2017-01-12 MED ORDER — ACETAMINOPHEN 325 MG PO TABS
325.0000 mg | ORAL_TABLET | ORAL | Status: DC | PRN
  Administered 2017-01-12: 650 mg via ORAL

## 2017-01-12 MED ORDER — PHENOL 1.4 % MT LIQD: 1.0000 | OROMUCOSAL | Status: DC | PRN

## 2017-01-12 MED ORDER — LACTATED RINGERS IV SOLN
INTRAVENOUS | Status: DC
  Administered 2017-01-12 (×2): via INTRAVENOUS

## 2017-01-12 MED ORDER — VITAMIN B-12 2500 MCG SL SUBL: 5000.0000 ug | SUBLINGUAL_TABLET | Freq: Every day | SUBLINGUAL | Status: DC

## 2017-01-12 MED ORDER — ASPIRIN EC 81 MG PO TBEC
81.0000 mg | DELAYED_RELEASE_TABLET | Freq: Every day | ORAL | Status: DC
  Administered 2017-01-13: 81 mg via ORAL
  Filled 2017-01-12: qty 1

## 2017-01-12 MED ORDER — PHENYLEPHRINE HCL 10 MG/ML IJ SOLN
INTRAMUSCULAR | Status: DC | PRN
  Administered 2017-01-12: 100 ug/min via INTRAVENOUS

## 2017-01-12 MED ORDER — LOSARTAN POTASSIUM 50 MG PO TABS: 50.0000 mg | ORAL_TABLET | Freq: Every day | ORAL | Status: DC

## 2017-01-12 MED ORDER — MAGNESIUM SULFATE 2 GM/50ML IV SOLN: 2.0000 g | Freq: Every day | INTRAVENOUS | Status: DC | PRN

## 2017-01-12 MED ORDER — PROPOFOL 10 MG/ML IV BOLUS
INTRAVENOUS | Status: AC
  Filled 2017-01-12: qty 20

## 2017-01-12 MED ORDER — GLYCOPYRROLATE 0.2 MG/ML IV SOSY
PREFILLED_SYRINGE | INTRAVENOUS | Status: DC | PRN
  Administered 2017-01-12 (×3): .2 mg via INTRAVENOUS

## 2017-01-12 MED ORDER — ROCURONIUM BROMIDE 10 MG/ML (PF) SYRINGE
PREFILLED_SYRINGE | INTRAVENOUS | Status: DC | PRN
  Administered 2017-01-12: 50 mg via INTRAVENOUS

## 2017-01-12 MED ORDER — MIDAZOLAM HCL 2 MG/2ML IJ SOLN
INTRAMUSCULAR | Status: AC
Start: 2017-01-12 — End: 2017-01-12
  Filled 2017-01-12: qty 2

## 2017-01-12 MED ORDER — METOPROLOL SUCCINATE ER 25 MG PO TB24
25.0000 mg | ORAL_TABLET | Freq: Every day | ORAL | Status: DC
  Filled 2017-01-12: qty 1

## 2017-01-12 MED ORDER — ALUM & MAG HYDROXIDE-SIMETH 200-200-20 MG/5ML PO SUSP: 15.0000 mL | ORAL | Status: DC | PRN

## 2017-01-12 MED ORDER — DEXTRAN 40 IN SALINE 10-0.9 % IV SOLN
INTRAVENOUS | Status: AC
  Filled 2017-01-12: qty 500

## 2017-01-12 MED ORDER — HEPARIN SODIUM (PORCINE) 1000 UNIT/ML IJ SOLN
INTRAMUSCULAR | Status: DC | PRN
  Administered 2017-01-12: 2000 [IU] via INTRAVENOUS
  Administered 2017-01-12: 9500 [IU] via INTRAVENOUS

## 2017-01-12 MED ORDER — GUAIFENESIN-DM 100-10 MG/5ML PO SYRP: 15.0000 mL | ORAL_SOLUTION | ORAL | Status: DC | PRN

## 2017-01-12 MED ORDER — GABAPENTIN 300 MG PO CAPS
300.0000 mg | ORAL_CAPSULE | Freq: Every day | ORAL | Status: DC
  Administered 2017-01-12 – 2017-01-13 (×2): 300 mg via ORAL
  Filled 2017-01-12 (×2): qty 1

## 2017-01-12 MED ORDER — HEMOSTATIC AGENTS (NO CHARGE) OPTIME
TOPICAL | Status: DC | PRN
  Administered 2017-01-12: 1 via TOPICAL

## 2017-01-12 MED ORDER — PROTAMINE SULFATE 10 MG/ML IV SOLN
INTRAVENOUS | Status: DC | PRN
  Administered 2017-01-12: 50 mg via INTRAVENOUS

## 2017-01-12 MED ORDER — ADULT MULTIVITAMIN W/MINERALS CH
1.0000 | ORAL_TABLET | Freq: Every day | ORAL | Status: DC
  Filled 2017-01-12: qty 1

## 2017-01-12 MED ORDER — CEFUROXIME SODIUM 1.5 G IJ SOLR
1.5000 g | Freq: Two times a day (BID) | INTRAMUSCULAR | Status: AC
  Administered 2017-01-12 – 2017-01-13 (×2): 1.5 g via INTRAVENOUS
  Filled 2017-01-12 (×2): qty 1.5

## 2017-01-12 MED ORDER — DEXTROSE 5 % IV SOLN
1.5000 g | INTRAVENOUS | Status: AC
  Administered 2017-01-12: 1.5 g via INTRAVENOUS

## 2017-01-12 MED ORDER — SODIUM CHLORIDE 0.9 % IV SOLN
INTRAVENOUS | Status: DC | PRN
  Administered 2017-01-12: 500 mL

## 2017-01-12 MED ORDER — PROPOFOL 10 MG/ML IV BOLUS
INTRAVENOUS | Status: DC | PRN
  Administered 2017-01-12: 160 mg via INTRAVENOUS

## 2017-01-12 MED ORDER — DOPAMINE-DEXTROSE 3.2-5 MG/ML-% IV SOLN
2.0000 ug/kg/min | INTRAVENOUS | Status: DC
  Administered 2017-01-12: 2 ug/kg/min via INTRAVENOUS
  Filled 2017-01-12: qty 250

## 2017-01-12 MED ORDER — SODIUM CHLORIDE 0.9 % IV SOLN
500.0000 mL | Freq: Once | INTRAVENOUS | Status: AC | PRN
  Administered 2017-01-12 (×2): 500 mL via INTRAVENOUS

## 2017-01-12 MED ORDER — ROSUVASTATIN CALCIUM 40 MG PO TABS
40.0000 mg | ORAL_TABLET | Freq: Every day | ORAL | Status: DC
  Administered 2017-01-12 – 2017-01-13 (×2): 40 mg via ORAL
  Filled 2017-01-12 (×2): qty 1

## 2017-01-12 MED ORDER — EZETIMIBE 10 MG PO TABS
10.0000 mg | ORAL_TABLET | Freq: Every day | ORAL | Status: DC
  Administered 2017-01-12 – 2017-01-13 (×2): 10 mg via ORAL
  Filled 2017-01-12 (×2): qty 1

## 2017-01-12 MED ORDER — SODIUM CHLORIDE 0.9 % IV SOLN
0.0125 ug/kg/min | INTRAVENOUS | Status: AC
  Administered 2017-01-12: .2 ug/kg/min via INTRAVENOUS
  Filled 2017-01-12: qty 2000

## 2017-01-12 MED ORDER — VITAMIN D 1000 UNITS PO TABS
5000.0000 [IU] | ORAL_TABLET | Freq: Every day | ORAL | Status: DC
Start: 2017-01-13 — End: 2017-01-14
  Filled 2017-01-12: qty 5

## 2017-01-12 MED ORDER — ONDANSETRON HCL 4 MG/2ML IJ SOLN
4.0000 mg | Freq: Four times a day (QID) | INTRAMUSCULAR | Status: DC | PRN
  Administered 2017-01-12 – 2017-01-13 (×3): 4 mg via INTRAVENOUS
  Filled 2017-01-12 (×4): qty 2

## 2017-01-12 MED ORDER — LIDOCAINE HCL (PF) 1 % IJ SOLN
INTRAMUSCULAR | Status: AC
  Filled 2017-01-12: qty 30

## 2017-01-12 MED ORDER — METOPROLOL TARTRATE 5 MG/5ML IV SOLN: 2.0000 mg | INTRAVENOUS | Status: DC | PRN

## 2017-01-12 MED ORDER — DEXTRAN 40 IN SALINE 10-0.9 % IV SOLN
INTRAVENOUS | Status: DC | PRN
  Administered 2017-01-12: 500 mL

## 2017-01-12 MED ORDER — ONDANSETRON HCL 4 MG/2ML IJ SOLN
INTRAMUSCULAR | Status: DC | PRN
  Administered 2017-01-12: 4 mg via INTRAVENOUS

## 2017-01-12 MED ORDER — LIDOCAINE 2% (20 MG/ML) 5 ML SYRINGE
INTRAMUSCULAR | Status: DC | PRN
  Administered 2017-01-12: 100 mg via INTRAVENOUS

## 2017-01-12 MED ORDER — NICOTINE POLACRILEX 2 MG MT GUM: 2.0000 mg | CHEWING_GUM | OROMUCOSAL | Status: DC | PRN

## 2017-01-12 MED ORDER — ACETAMINOPHEN 325 MG PO TABS
ORAL_TABLET | ORAL | Status: AC
  Filled 2017-01-12: qty 2

## 2017-01-12 MED ORDER — PROMETHAZINE HCL 25 MG/ML IJ SOLN: 6.2500 mg | INTRAMUSCULAR | Status: DC | PRN

## 2017-01-12 MED ORDER — 0.9 % SODIUM CHLORIDE (POUR BTL) OPTIME
TOPICAL | Status: DC | PRN
Start: 2017-01-12 — End: 2017-01-12
  Administered 2017-01-12: 2000 mL

## 2017-01-12 MED ORDER — MORPHINE SULFATE (PF) 2 MG/ML IV SOLN
2.0000 mg | INTRAVENOUS | Status: DC | PRN
  Administered 2017-01-12 – 2017-01-13 (×4): 2 mg via INTRAVENOUS
  Filled 2017-01-12 (×4): qty 1

## 2017-01-12 MED ORDER — LABETALOL HCL 5 MG/ML IV SOLN: 10.0000 mg | INTRAVENOUS | Status: DC | PRN

## 2017-01-12 MED ORDER — SODIUM CHLORIDE 0.9 % IV SOLN
INTRAVENOUS | Status: DC
  Administered 2017-01-12: 19:00:00 via INTRAVENOUS

## 2017-01-12 MED ORDER — HYDROMORPHONE HCL 1 MG/ML IJ SOLN
INTRAMUSCULAR | Status: AC
  Filled 2017-01-12: qty 0.5

## 2017-01-12 MED ORDER — ACETAMINOPHEN 325 MG RE SUPP: 325.0000 mg | RECTAL | Status: DC | PRN

## 2017-01-12 MED ORDER — DOCUSATE SODIUM 100 MG PO CAPS
100.0000 mg | ORAL_CAPSULE | Freq: Every day | ORAL | Status: DC
  Administered 2017-01-13: 100 mg via ORAL
  Filled 2017-01-12: qty 1

## 2017-01-12 MED ORDER — POLYETHYLENE GLYCOL 3350 17 G PO PACK: 17.0000 g | PACK | Freq: Every day | ORAL | Status: DC | PRN

## 2017-01-12 MED ORDER — PHENOL 1.4 % MT LIQD
1.0000 | OROMUCOSAL | Status: DC | PRN
Start: 2017-01-12 — End: 2017-01-14
  Administered 2017-01-12: 1 via OROMUCOSAL
  Filled 2017-01-12 (×2): qty 177

## 2017-01-12 MED ORDER — LORATADINE 10 MG PO TABS
10.0000 mg | ORAL_TABLET | Freq: Every day | ORAL | Status: DC
  Administered 2017-01-13: 10 mg via ORAL
  Filled 2017-01-12: qty 1

## 2017-01-12 MED ORDER — HYDRALAZINE HCL 20 MG/ML IJ SOLN: 5.0000 mg | INTRAMUSCULAR | Status: DC | PRN

## 2017-01-12 MED ORDER — ONDANSETRON HCL 4 MG/2ML IJ SOLN
INTRAMUSCULAR | Status: AC
  Filled 2017-01-12: qty 2

## 2017-01-12 MED ORDER — HYDROMORPHONE HCL 1 MG/ML IJ SOLN
0.2500 mg | INTRAMUSCULAR | Status: DC | PRN
  Administered 2017-01-12: 0.25 mg via INTRAVENOUS

## 2017-01-12 MED ORDER — PANTOPRAZOLE SODIUM 40 MG PO TBEC
40.0000 mg | DELAYED_RELEASE_TABLET | Freq: Every day | ORAL | Status: DC
  Administered 2017-01-13: 40 mg via ORAL
  Filled 2017-01-12: qty 1

## 2017-01-12 MED ORDER — EPHEDRINE SULFATE-NACL 50-0.9 MG/10ML-% IV SOSY
PREFILLED_SYRINGE | INTRAVENOUS | Status: DC | PRN
Start: 2017-01-12 — End: 2017-01-12
  Administered 2017-01-12 (×4): 5 mg via INTRAVENOUS
  Administered 2017-01-12: 10 mg via INTRAVENOUS
  Administered 2017-01-12: 5 mg via INTRAVENOUS

## 2017-01-12 SURGICAL SUPPLY — 59 items
ADH SKN CLS APL DERMABOND .7 (GAUZE/BANDAGES/DRESSINGS) ×1
ADPR TBG 2 MALE LL ART (MISCELLANEOUS)
AGENT HMST SPONGE THK3/8 (HEMOSTASIS) ×1
BAG DECANTER FOR FLEXI CONT (MISCELLANEOUS) ×3 IMPLANT
CANISTER SUCTION 2500CC (MISCELLANEOUS) ×3 IMPLANT
CATH ROBINSON RED A/P 18FR (CATHETERS) ×3 IMPLANT
CLIP TI MEDIUM 6 (CLIP) ×3 IMPLANT
CLIP TI WIDE RED SMALL 6 (CLIP) ×3 IMPLANT
COVER PROBE W GEL 5X96 (DRAPES) ×2 IMPLANT
CRADLE DONUT ADULT HEAD (MISCELLANEOUS) ×3 IMPLANT
DERMABOND ADVANCED (GAUZE/BANDAGES/DRESSINGS) ×2
DERMABOND ADVANCED .7 DNX12 (GAUZE/BANDAGES/DRESSINGS) ×1 IMPLANT
ELECT REM PT RETURN 9FT ADLT (ELECTROSURGICAL) ×3
ELECTRODE REM PT RTRN 9FT ADLT (ELECTROSURGICAL) ×1 IMPLANT
GLOVE BIO SURGEON STRL SZ 6.5 (GLOVE) ×2 IMPLANT
GLOVE BIO SURGEON STRL SZ7 (GLOVE) ×5 IMPLANT
GLOVE BIO SURGEONS STRL SZ 6.5 (GLOVE) ×2
GLOVE BIOGEL PI IND STRL 6.5 (GLOVE) IMPLANT
GLOVE BIOGEL PI IND STRL 7.0 (GLOVE) IMPLANT
GLOVE BIOGEL PI IND STRL 7.5 (GLOVE) ×1 IMPLANT
GLOVE BIOGEL PI INDICATOR 6.5 (GLOVE) ×4
GLOVE BIOGEL PI INDICATOR 7.0 (GLOVE) ×4
GLOVE BIOGEL PI INDICATOR 7.5 (GLOVE) ×4
GLOVE ECLIPSE 6.5 STRL STRAW (GLOVE) ×2 IMPLANT
GLOVE SKINSENSE NS SZ7.0 (GLOVE) ×6
GLOVE SKINSENSE NS SZ7.5 (GLOVE) ×2
GLOVE SKINSENSE STRL SZ7.0 (GLOVE) IMPLANT
GLOVE SKINSENSE STRL SZ7.5 (GLOVE) IMPLANT
GOWN STRL REUS W/ TWL LRG LVL3 (GOWN DISPOSABLE) ×3 IMPLANT
GOWN STRL REUS W/TWL LRG LVL3 (GOWN DISPOSABLE) ×18
HEMOSTAT SPONGE AVITENE ULTRA (HEMOSTASIS) ×2 IMPLANT
IV ADAPTER SYR DOUBLE MALE LL (MISCELLANEOUS) IMPLANT
KIT BASIN OR (CUSTOM PROCEDURE TRAY) ×3 IMPLANT
KIT ROOM TURNOVER OR (KITS) ×3 IMPLANT
KIT SHUNT ARGYLE CAROTID ART 6 (VASCULAR PRODUCTS) ×2 IMPLANT
NDL HYPO 25GX1X1/2 BEV (NEEDLE) IMPLANT
NEEDLE HYPO 25GX1X1/2 BEV (NEEDLE) IMPLANT
NS IRRIG 1000ML POUR BTL (IV SOLUTION) ×9 IMPLANT
PACK CAROTID (CUSTOM PROCEDURE TRAY) ×3 IMPLANT
PAD ARMBOARD 7.5X6 YLW CONV (MISCELLANEOUS) ×6 IMPLANT
PATCH VASC XENOSURE 1CMX6CM (Vascular Products) ×3 IMPLANT
PATCH VASC XENOSURE 1X6 (Vascular Products) ×1 IMPLANT
SET COLLECT BLD 21X3/4 12 PB (MISCELLANEOUS) IMPLANT
SHUNT CAROTID BYPASS 10 (VASCULAR PRODUCTS) IMPLANT
SHUNT CAROTID BYPASS 12FRX15.5 (VASCULAR PRODUCTS) IMPLANT
STOPCOCK 4 WAY LG BORE MALE ST (IV SETS) IMPLANT
SUT ETHILON 3 0 PS 1 (SUTURE) ×2 IMPLANT
SUT MNCRL AB 4-0 PS2 18 (SUTURE) ×3 IMPLANT
SUT PROLENE 6 0 BV (SUTURE) ×11 IMPLANT
SUT PROLENE 7 0 BV 1 (SUTURE) ×2 IMPLANT
SUT SILK 3 0 TIES 17X18 (SUTURE)
SUT SILK 3-0 18XBRD TIE BLK (SUTURE) IMPLANT
SUT VIC AB 3-0 SH 27 (SUTURE) ×3
SUT VIC AB 3-0 SH 27X BRD (SUTURE) ×1 IMPLANT
SYR TB 1ML LUER SLIP (SYRINGE) IMPLANT
SYSTEM CHEST DRAIN TLS 7FR (DRAIN) ×4 IMPLANT
TUBING ART PRESS 48 MALE/FEM (TUBING) IMPLANT
TUBING EXTENTION W/L.L. (IV SETS) IMPLANT
WATER STERILE IRR 1000ML POUR (IV SOLUTION) ×3 IMPLANT

## 2017-01-12 NOTE — Transfer of Care (Signed)
Immediate Anesthesia Transfer of Care Note  Patient: Tommy May  Procedure(s) Performed: Procedure(s): Left Carotid ENDARTERECTOMY (Left) PATCH ANGIOPLASTY (Left)  Patient Location: PACU  Anesthesia Type:General  Level of Consciousness: awake, alert , oriented and patient cooperative  Airway & Oxygen Therapy: Patient Spontanous Breathing and Patient connected to nasal cannula oxygen  Post-op Assessment: Report given to RN, Post -op Vital signs reviewed and stable, Patient moving all extremities and Patient able to stick tongue midline  Post vital signs: Reviewed and stable  Last Vitals:  Vitals:   01/12/17 1018  BP: (!) 155/79  Pulse: (!) 50  Resp: 18  Temp: 36.8 C    Last Pain:  Vitals:   01/12/17 1018  TempSrc: Oral      Patients Stated Pain Goal: 4 ([id number])  Complications: No apparent anesthesia complications

## 2017-01-12 NOTE — Interval H&P Note (Signed)
History and Physical Interval Note:  01/12/2017 10:11 AM  Tommy May  has presented today for surgery, with the diagnosis of Left carotid artery stenosis I65.22  The various methods of treatment have been discussed with the patient and family. After consideration of risks, benefits and other options for treatment, the patient has consented to  Procedure(s): ENDARTERECTOMY CAROTID (Left) as a surgical intervention .  The patient's history has been reviewed, patient examined, no change in status, stable for surgery.  I have reviewed the patient's chart and labs.  Questions were answered to the patient's satisfaction.     Adele Barthel

## 2017-01-12 NOTE — Anesthesia Postprocedure Evaluation (Addendum)
Anesthesia Post Note  Patient: PRABHJOT BEGER  Procedure(s) Performed: Procedure(s) (LRB): Left Carotid ENDARTERECTOMY (Left) PATCH ANGIOPLASTY (Left)  Patient location during evaluation: PACU Anesthesia Type: General Level of consciousness: awake, sedated and oriented Pain management: pain level controlled Vital Signs Assessment: post-procedure vital signs reviewed and stable Respiratory status: spontaneous breathing, nonlabored ventilation, respiratory function stable and patient connected to nasal cannula oxygen Cardiovascular status: blood pressure returned to baseline and stable Postop Assessment: no signs of nausea or vomiting Anesthetic complications: no       Last Vitals:  Vitals:   01/12/17 1551 01/12/17 1606  BP: (!) 93/52 (!) 97/52  Pulse: 70 66  Resp: (!) 26 (!) 29  Temp:      Last Pain:  Vitals:   01/12/17 1018  TempSrc: Oral                 Takahiro Godinho,JAMES TERRILL

## 2017-01-12 NOTE — Anesthesia Procedure Notes (Signed)
Procedure Name: Intubation Date/Time: 01/12/2017 12:03 PM Performed by: Julieta Bellini Pre-anesthesia Checklist: Patient identified, Emergency Drugs available, Suction available and Patient being monitored Patient Re-evaluated:Patient Re-evaluated prior to inductionOxygen Delivery Method: Circle system utilized Preoxygenation: Pre-oxygenation with 100% oxygen Intubation Type: IV induction Ventilation: Mask ventilation without difficulty Laryngoscope Size: Mac and 3 Grade View: Grade I Tube type: Oral Tube size: 7.5 mm Number of attempts: 1 Airway Equipment and Method: Stylet Placement Confirmation: ETT inserted through vocal cords under direct vision,  positive ETCO2 and breath sounds checked- equal and bilateral Secured at: 23 cm Tube secured with: Tape Dental Injury: Teeth and Oropharynx as per pre-operative assessment

## 2017-01-12 NOTE — Progress Notes (Signed)
  [facility] Day of Surgery Note  Subjective: Patient seen in PACU.  Had trouble voiding. Just had I & O cath. Complaining of throat pain.   Vitals:   01/12/17 1018 01/12/17 1522  BP: (!) 155/79   Pulse: (!) 50   Resp: 18   Temp: 98.2 F (36.8 C) 99.3 F (37.4 C)   Increased respiratory rate secondary to throat pain. 02 sat 97% 2L Left neck without hematoma Tongue midline. Smile symmetric. 5/5 strength upper and lower extremities.   Assessment/Plan:  This is a 56 y.o. male who is s/p left CEA  Neuro exam intact.  Neck without hematoma. Required in and out cath in PACU. If having additional urinary retention, place foley.   Virgina Jock, [place] Pager: [phone] 01/12/2017 3:43 PM

## 2017-01-12 NOTE — Op Note (Addendum)
OPERATIVE NOTE   PROCEDURE:   1.  left carotid endarterectomy with bovine patch angioplasty 2.  left intraoperative carotid ultrasound  PRE-OPERATIVE DIAGNOSIS: left asymptomatic carotid stenosis >80%  POST-OPERATIVE DIAGNOSIS: same as above   SURGEON: Adele Barthel, MD  ASSISTANT(S): Gae Gallop, MD; Leontine Locket, [facility]   ANESTHESIA: general  ESTIMATED BLOOD LOSS: 300 cc  FINDING(S): 1.  Continuous Doppler audible flow signatures are appropriate for each carotid artery. 2.  No evidence of intimal flap visualized on transverse or longitudinal ultrasonography. 3.  Carotid plaque: tandem plaques (common carotid artery and internal carotid artery), internal carotid artery plaque with necrotic core and near occluded. 4.  Vagus nerve: anterolateral position, no obvious injury 5.  Hypoglossal nerve: visualized, no obvious injury   SPECIMEN(S):  Carotid plaque (sent to Pathology)  INDICATIONS:   Tommy May is a 56 y.o. male who presents with left asymptomatic carotid stenosis >80%.  I discussed with the patient the risks, benefits, and alternatives to carotid endarterectomy.  I discussed the procedural details of carotid endarterectomy with the patient.  The patient is aware that the risks of carotid endarterectomy include but are not limited to: bleeding, infection, stroke, myocardial infarction, death, cranial nerve injuries both temporary and permanent, neck hematoma, possible airway compromise, labile blood pressure post-operatively, cerebral hyperperfusion syndrome, and possible need for additional interventions in the future. The patient is aware of the risks and agrees to proceed forward with the procedure.   DESCRIPTION: After full informed written consent was obtained from the patient, the patient was brought back to the operating room and placed supine upon the operating table.  Prior to induction, the patient received IV antibiotics.  After obtaining adequate  anesthesia, the patient was placed into semi-Fowler position with a shoulder roll in place and the patient's neck slightly hyperextended and rotated away from the surgical site.    The patient was prepped in the standard fashion for a left carotid endarterectomy.  I made an incision anterior to the sternocleidomastoid muscle and dissected down through the subcutaneous tissue.  The platysmas was opened with electrocautery.  Then I dissected down to the internal jugular vein.  This was dissected posteriorly until I obtained visualization of the common carotid artery.  This was dissected out and then an umbilical tape was placed around the common carotid artery and I loosely applied a Rumel tourniquet.  I then dissected in a periadventitial fashion along the common carotid artery up to the bifurcation.  I then identified the external carotid artery and the superior thyroid artery.  A 2-0 silk tie was looped around the superior thyroid artery, and I also dissected out the external carotid artery and placed a vessel loop around it.  In continuing the dissection to the internal carotid artery, I identified the facial vein.  This was ligated and then transected, giving me improved exposure of the internal carotid artery.  In the process of this dissection, the hypoglossal nerve was identified.  I then dissected out the internal carotid artery until I identified an area of soft tissue in the internal carotid artery.  I dissected slightly distal to this area, and placed an umbilical tape around the artery and loosely applied a Rumel tourniquet.    At this point, the patient was given 9500 units of Heparin intravenously, which was a therapeutic bolus. An additional 2000 units of Heparin was administered every hour after initial bolus to maintain anticoagulation.  In total, 11500 units of Heparin was administrated to  achieve and maintain a therapeutic level of anticoagulation.  After waiting 3 minutes, then I clamped the  internal carotid artery, external carotid artery and then the common carotid artery.  I then made an arteriotomy in the common carotid artery with a 11 blade, and extended the arteriotomy with a Potts scissor down into the common carotid artery.  In this process, I encounter a second common carotid artery plaque that was not found on carotid duplex.  I had to extend this arteriotomy proximally to get through the plaque.  I then extended the arteriotomy through the bifurcation into the internal carotid artery until I reached an area that was not diseased.  This internal carotid artery plaque was near occluded and necrotic in its core.    At this point, I took the 10 shunt that previously been prepared and I inserted it into the internal carotid artery.  I did not have to apply the Rumel as there was no bleeding around the shunt.  I unclamped the shunt to verify retrograde blood flow in the internal carotid artery.  I then placed the other end of the shunt into the common carotid artery after unclamping the artery.  The Rumel was tightened down around the shunt.  At this point, I verified blood flow in the shunt with a continuous doppler.  At this point, I started the endarterectomy in the common carotid artery with a Technical brewer and carried this dissection down into the common carotid artery circumferentially.  Then I transected the plaque at a segment where it was adherent.  I then carried this dissection up into the external carotid artery.  The plaque was extracted by unclamping the external carotid artery and everting the artery.  The dissection was then carried into the internal carotid artery, extracting the remaining portion of the carotid plaque.  I passed the plaque off the field as a specimen.    I then spent the next 30 minutes removing intimal flaps and loose debris. I felt that the residual plaque on the common carotid artery end of the artery was too bulky so I dissected out the common  carotid artery more proximally and applied a new Rumel tourniquet.  I removed the prior tourniquet and extended the arteriotomy more proximally.  I performed an endarterectomy on this more proximal segment of the artery.  There continued to be some densely adherent anterior wall plaque, <30% stenosis.  I continued removing intimal flaps and loose debris until eventually I reached the point where the residual plaque was densely adherent and any further dissection would compromise the integrity of the wall.  After verifying that there was no more loose intimal flaps or debris, I re-interrogated the entirety of this carotid artery.  At this point, I was satisfied that the minimal remaining disease was densely adherent to the wall and wall integrity was intact.  At this point, I then fashioned a bovine pericardial patch for the geometry of this artery and sewed it in place with two running stitch of 6-0 Prolene, one from each end.  Prior to completing this patch angioplasty, I removed the shunt first from the internal carotid artery, from which there was excellent backbleeding, and clamped it.  Then I removed the shunt from the common carotid artery, from which there was excellent antegrade bleeding, and then clamped it.  At this point, I allowed the external carotid artery to backbleed, which was reasonable.  Then I instilled heparinized saline in this patched artery and then completed  the patch angioplasty in the usual fashion.    At this point, I first released the clamp on the external carotid artery, then I released it on the common carotid artery.  After waiting a few seconds, I then released it on the internal carotid artery.  I then interrogated this patient's arteries with the continuous Doppler.  The audible waveforms in each artery were consistent with the expected characteristics for each artery.  The Sonosite probe was then sterilely draped and used to interrogate the carotid artery in both longitudinal  and transverse views.    At this point, I washed out the wound, and placed Avitene throughout.  I also gave the patient 50 mg of protamine to reverse his anticoagulation.   After waiting a few minutes, I removed the Avitene and washed out the wound.  There was no more general oozing in the surgical site without any bleeding from the suture line.  Given the anterolateral position of the vagus nerve, I could not control all oozing with electrocautery.  I elected to place a TLS drain.  I passed the TLS trocar through the subcutaneous tissue inferior to the incision line.  I pulled the drain to appropriate position and secured it with a 3-0 Nylon suture tied to the drain.  I shortened the drain to appropriate length and place it adjacent to the artery and nerve.  The exterior hardware was attached to the drain and the test tube was not attached until the skin was close.    I repacked the surgical wound with Avitene.  After waiting a few minutes, no further active bleeding was present.  I then reapproximated the platysma muscle with a running stitch of 3-0 Vicryl.  The skin was then reapproximated with a running subcuticular 4-0 Monocryl stitch.  The skin was then cleaned, dried and LiquidBand was used to reinforce the skin closure.    The patient woke without any problems, neurologically intact.      COMPLICATIONS: none  CONDITION: stable   Adele Barthel, MD, [facility] [place] Office: [phone] Pager: [phone]  01/12/2017, 3:09 PM

## 2017-01-12 NOTE — Anesthesia Preprocedure Evaluation (Addendum)
Anesthesia Evaluation  Patient identified by MRN, date of birth, ID band Patient awake    Reviewed: Allergy & Precautions, NPO status , Patient's Chart, lab work & pertinent test results  Airway Mallampati: I  TM Distance: >3 FB Neck ROM: Full    Dental  (+) Teeth Intact, Caps, Dental Advisory Given   Pulmonary sleep apnea , Current Smoker,    breath sounds clear to auscultation       Cardiovascular hypertension, + Peripheral Vascular Disease   Rhythm:Regular Rate:Normal     Neuro/Psych    GI/Hepatic Neg liver ROS, GERD  ,  Endo/Other  negative endocrine ROS  Renal/GU negative Renal ROS     Musculoskeletal negative musculoskeletal ROS (+)   Abdominal   Peds  Hematology negative hematology ROS (+)   Anesthesia Other Findings   Reproductive/Obstetrics                             Anesthesia Physical Anesthesia Plan  ASA: III  Anesthesia Plan: General   Post-op Pain Management:    Induction: Intravenous  Airway Management Planned: Oral ETT  Additional Equipment: Arterial line  Intra-op Plan:   Post-operative Plan: Extubation in OR  Informed Consent: I have reviewed the patients History and Physical, chart, labs and discussed the procedure including the risks, benefits and alternatives for the proposed anesthesia with the patient or authorized representative who has indicated his/her understanding and acceptance.   Dental advisory given  Plan Discussed with: CRNA and Anesthesiologist  Anesthesia Plan Comments:        Anesthesia Quick Evaluation

## 2017-01-12 NOTE — H&P (View-Only) (Signed)
Established Carotid Patient  History of Present Illness  Tommy May is a 56 y.o. (May 09, 1961) male who presents with chief complaint: no recent visual changes.  Previous carotid studies demonstrated: RICA A999333 stenosis, LICA 123456 stenosis.  Patient has no history of TIA or stroke symptom.  The patient has never had amaurosis fugax or monocular blindness.  The patient had one episode that was suggestive of central hemianopsia.  The patient has never had facial drooping or hemiplegia.  The patient has never had receptive or expressive aphasia.   The patient was sent to Cardiology for preop risk stratification and optimization.  He has been cleared to proceed with CEA.  Past Medical History:  Diagnosis Date  . Allergy   . Environmental and seasonal allergies   . GERD (gastroesophageal reflux disease)   . Heart palpitations   . History of Holter monitoring   . Hyperlipidemia   . Hypertension    does not take meds  . Nerve pain    neck; takes gabapentin  . Sleep apnea    wears CPAP nightly    Past Surgical History:  Procedure Laterality Date  . dental implants    . INGUINAL HERNIA REPAIR Left 08/25/2014   Procedure: LEFT INGUINAL HERNIA REPAIR REMOVAL SPERMATIC CORD MASS;  Surgeon: Jackolyn Confer, MD;  Location: [facility];  Service: General;  Laterality: Left;  . INSERTION OF MESH N/A 08/25/2014   Procedure: INSERTION OF MESH;  Surgeon: Jackolyn Confer, MD;  Location: [facility];  Service: General;  Laterality: N/A;  . ROTATOR CUFF REPAIR Left     Social History   Social History  . Marital status: Married    Spouse name: N/A  . Number of children: 2  . Years of education: 12   Occupational History  . purchasing    Social History Main Topics  . Smoking status: Current Every Day Smoker    Packs/day: 0.75    Years: 43.00    Types: Cigarettes  . Smokeless tobacco: Never Used  . Alcohol use No  . Drug use: No  . Sexual activity: Not on file   Other Topics Concern  .  Not on file   Social History Narrative   Pt lives with his wife and two children.  Graduated high school.    Family History  Problem Relation Age of Onset  . Stroke Father 81  . Diabetes Father   . Diabetes Paternal Grandmother   . Stroke Paternal Grandfather 79    guess early 8's  . Heart attack Maternal Grandfather     Current Outpatient Prescriptions  Medication Sig Dispense Refill  . acetaminophen (TYLENOL) 500 MG tablet Take 500 mg by mouth every 8 (eight) hours as needed for moderate pain.    Marland Kitchen aspirin EC 81 MG tablet Take 1 tablet (81 mg total) by mouth daily. 90 tablet 3  . esomeprazole (NEXIUM) 40 MG capsule Take 40 mg by mouth daily.     Marland Kitchen ezetimibe (ZETIA) 10 MG tablet Take 1 tablet (10 mg total) by mouth daily. 30 tablet 6  . gabapentin (NEURONTIN) 300 MG capsule TAKE ONE CAPSULE BY MOUTH AT BEDTIME 90 capsule 1  . loratadine (CLARITIN) 10 MG tablet Take 10 mg by mouth daily as needed for allergies.    Marland Kitchen losartan (COZAAR) 50 MG tablet Take 1 tablet (50 mg total) by mouth daily. 90 tablet 3  . metoprolol succinate (TOPROL-XL) 25 MG 24 hr tablet Take 25 mg by mouth daily.    Marland Kitchen  Multiple Vitamin (MULTIVITAMIN WITH MINERALS) TABS tablet Take 1 tablet by mouth daily.    . rosuvastatin (CRESTOR) 40 MG tablet Take 1 tablet (40 mg total) by mouth daily. 30 tablet 12  . traMADol (ULTRAM) 50 MG tablet TAKE 1 TABLET TWICE A DAY AS NEEDED FOR MODERATE PAIN 60 tablet 1   No current facility-administered medications for this visit.      Allergies  Allergen Reactions  . Atorvastatin     Muscle pain    REVIEW OF SYSTEMS:  (Positives checked otherwise negative)  CARDIOVASCULAR:   [ ]  chest pain,  [ ]  chest pressure,  [ ]  palpitations,  [ ]  shortness of breath when laying flat,  [ ]  shortness of breath with exertion,   [ ]  pain in feet when walking,  [ ]  pain in feet when laying flat, [ ]  history of blood clot in veins (DVT),  [ ]  history of phlebitis,  [ ]  swelling in  legs,  [ ]  varicose veins  PULMONARY:   [ ]  productive cough,  [ ]  asthma,  [ ]  wheezing  NEUROLOGIC:   [ ]  weakness in arms or legs,  [ ]  numbness in arms or legs,  [ ]  difficulty speaking or slurred speech,  [x]  temporary loss of vision in one eye,  [ ]  dizziness  HEMATOLOGIC:   [ ]  bleeding problems,  [ ]  problems with blood clotting too easily  MUSCULOSKEL:   [ ]  joint pain, [ ]  joint swelling  GASTROINTEST:   [ ]  vomiting blood,  [ ]  blood in stool     GENITOURINARY:   [ ]  burning with urination,  [ ]  blood in urine  PSYCHIATRIC:   [ ]  history of major depression  INTEGUMENTARY:   [ ]  rashes,  [ ]  ulcers  CONSTITUTIONAL:   [ ]  fever,  [ ]  chills   Physical Examination  Vitals:   12/23/16 1538  BP: (!) 146/92  Pulse: 87  Resp: 16  Temp: 98.8 F (37.1 C)  TempSrc: Oral  SpO2: 95%  Weight: 201 lb (91.2 kg)  Height: 5\' 10"  (1.778 m)    Body mass index is 28.84 kg/m.  General: Alert, O x 3, WD,NAD  Eyes: PERRLA, EOMI,   Neck: Supple, mid-line trachea,    Pulmonary: Sym exp, good B air movt,CTA B  Cardiac: RRR, Nl S1, S2, no Murmurs, No rubs, No S3,S4  Vascular: Vessel Right Left  Radial Palpable Palpable  Brachial Palpable Palpable  Carotid Palpable, No Bruit Palpable, No Bruit  Aorta Not palpable N/A  Femoral Palpable Palpable  Popliteal Not palpable Not palpable  PT Palpable Palpable  DP Palpable Palpable   Gastrointestinal: soft, non-distended, non-tender to palpation, No guarding or rebound, no HSM, no masses, no CVAT B, No palpable prominent aortic pulse,    Musculoskeletal: M/S 5/5 throughout  , Extremities without ischemic changes  , No edema present,  , No LDS present  Neurologic: CN 2-12 intact , Pain and light touch intact in extremities , Motor exam as listed above   Non-Invasive Vascular Imaging  CAROTID DUPLEX (Date: 11/03/16):   R ICA stenosis: 1-39%  R VA: patent and antegrade  L ICA stenosis: 80-99%  L  VA: patent and antegrade   Medical Decision Making  OMNI HELLMICH is a 56 y.o. male who presents with: asx L ICA stenosis >80%, asx R ICA stenosis <50%   Based on the patient's vascular studies and examination, I have offered the patient:  L CEA. I discussed with the patient the risks, benefits, and alternatives to carotid endarterectomy.   The patient is aware that the risks of carotid endarterectomy include but are not limited to: bleeding, infection, stroke, myocardial infarction, death, cranial nerve injuries both temporary and permanent, neck hematoma, possible airway compromise, labile blood pressure post-operatively, cerebral hyperperfusion syndrome, and possible need for additional interventions in the future.  The patient is aware of the risks and agrees to proceed forward with the procedure.  He is schedule for the 22 FEB 18  I discussed in depth with the patient the nature of atherosclerosis, and emphasized the importance of maximal medical management including strict control of blood pressure, blood glucose, and lipid levels, antiplatelet agents, obtaining regular exercise, and cessation of smoking.    The patient is aware that without maximal medical management the underlying atherosclerotic disease process will progress, limiting the benefit of any interventions. The patient is currently on a statin:  Crestor. The patient is currently on an anti-platelet: ASA.  Thank you for allowing [place] to participate in this patient's care.   Adele Barthel, MD, FACS [facility] Office: [phone] Pager: [phone]

## 2017-01-13 ENCOUNTER — Encounter (HOSPITAL_COMMUNITY): Payer: Self-pay | Admitting: Vascular Surgery

## 2017-01-13 LAB — BASIC METABOLIC PANEL
Anion gap: 6 (ref 5–15)
BUN: 8 mg/dL (ref 6–20)
CO2: 26 mmol/L (ref 22–32)
Calcium: 7.8 mg/dL — ABNORMAL LOW (ref 8.9–10.3)
Chloride: 107 mmol/L (ref 101–111)
Creatinine, Ser: 0.83 mg/dL (ref 0.61–1.24)
GFR calc Af Amer: >60 mL/min (ref >60)
GFR calc non Af Amer: >60 mL/min (ref >60)
Glucose, Bld: 161 mg/dL — ABNORMAL HIGH (ref 65–99)
Potassium: 3.8 mmol/L (ref 3.5–5.1)
Sodium: 139 mmol/L (ref 135–145)

## 2017-01-13 LAB — CBC
HCT: 43 % (ref 39.0–52.0)
Hemoglobin: 14.5 g/dL (ref 13.0–17.0)
MCH: 30.1 pg (ref 26.0–34.0)
MCHC: 33.7 g/dL (ref 30.0–36.0)
MCV: 89.4 fL (ref 78.0–100.0)
Platelets: 144 10*3/uL — ABNORMAL LOW (ref 150–400)
RBC: 4.81 MIL/uL (ref 4.22–5.81)
RDW: 12.7 % (ref 11.5–15.5)
WBC: 11 10*3/uL — ABNORMAL HIGH (ref 4.0–10.5)

## 2017-01-13 MED ORDER — PROMETHAZINE HCL 25 MG/ML IJ SOLN
12.5000 mg | Freq: Once | INTRAMUSCULAR | Status: AC
  Administered 2017-01-13: 12.5 mg via INTRAVENOUS
  Filled 2017-01-13: qty 1

## 2017-01-13 MED ORDER — PROMETHAZINE HCL 25 MG/ML IJ SOLN
12.5000 mg | INTRAMUSCULAR | Status: DC | PRN
  Administered 2017-01-13: 12.5 mg via INTRAVENOUS
  Filled 2017-01-13: qty 1

## 2017-01-13 NOTE — Progress Notes (Signed)
Patient with dopamine infusion. Drip rate decreased from 7 mcg/kg/min to 5 mcg/kg/min at 0803 this AM. BP's remained stable. No specific orders for weaning drip rate at this time - informed [facility] PA Gwenette Greet who stated it was okay to decrease rate to 2.5 mcg/kg/min at this time and continue to monitor. Drip decreased at 1035. Patient has been sitting up in chair since prior to shift change this AM. Nausea/pain improving. Patient stated he'd be willing to attempt to walk in the room and/or hall soon.   Joellen Jersey, RN.

## 2017-01-13 NOTE — Care Management Note (Signed)
Case Management Note  Patient Details  Name: Tommy May MRN: [id number] Date of Birth: [birth year]  Subjective/Objective:    S/p CEA, pta indep, lives with wife, has PCP, has medication coverage and transportation at dc.                  Action/Plan:   Expected Discharge Date:                  Expected Discharge Plan:  Home/Self Care  In-House Referral:     Discharge planning Services  CM Consult  Post Acute Care Choice:    Choice offered to:     DME Arranged:    DME Agency:     HH Arranged:    HH Agency:     Status of Service:  Completed, signed off  If discussed at [employer] of Stay Meetings, dates discussed:    Additional Comments:  Tommy Mayo, RN 01/13/2017, 3:34 PM

## 2017-01-13 NOTE — Progress Notes (Signed)
[facility] notified that dopamine and IVF were stopped at 1345 this afternoon. Patient ambulated 480 feet without assistance. Blood pressures have remained stable all day. They would still like to continue to monitor him overnight to make sure his BP's remain stable. Will inform patient.  Joellen Jersey, RN.

## 2017-01-13 NOTE — Plan of Care (Signed)
Problem: Activity: Goal: Ability to tolerate increased activity will improve Outcome: Completed/Met Date Met: 01/13/17 Ambulated 480 feet today.

## 2017-01-13 NOTE — Progress Notes (Addendum)
[facility]  Subjective  - Doing a little better this am with BP on Dopamine.   CC: nausea.   Objective (!) 104/57 63 98.6 F (37 C) (Oral) 13 94%  Intake/Output Summary (Last 24 hours) at 01/13/17 0727 Last data filed at 01/13/17 [id number]  Gross per 24 hour  Intake          3363.73 ml  Output             2522 ml  Net           841.73 ml    Drain output 10 cc.  D/C drain Grip 5/5 no tongue deviation, smile is symmetric Incision healing well without hematoma or erythema Palpable radial pulse  Assessment/Planning: POD # Left CEA  Will try and wean off Dopamine today Sitting up in chair. Tolerated d/c of drain well Phenergan ordered for nausea   Laurence Slate [facility] 01/13/2017 7:27 AM --  Laboratory Lab Results:  Recent Labs  01/13/17 0606  WBC 11.0*  HGB 14.5  HCT 43.0  PLT 144*   BMET  Recent Labs  01/13/17 0606  NA 139  K 3.8  CL 107  CO2 26  GLUCOSE 161*  BUN 8  CREATININE 0.83  CALCIUM 7.8*    COAG Lab Results  Component Value Date   INR 0.91 01/06/2017   INR 0.90 08/21/2014   No results found for: PTT   Addendum  I have independently interviewed and examined the patient, and I agree with the physician assistant's findings.  C/o headache.  Neuro intact.  No neck hematoma with minimal drain output overnight.  Soft BP requiring a bit of DA. Was able to avoid foley placement.  - Wean DA today, so keep in 4E - Hopefully nausea better today now that anes out of system - Watch for cerebral hyperperfusion, though doubt it given the BP on low end  Adele Barthel, MD, [facility] [place] Office: [phone] Pager: [phone]  01/13/2017, 8:12 AM

## 2017-01-14 MED ORDER — LOSARTAN POTASSIUM 50 MG PO TABS: 50.0000 mg | ORAL_TABLET | Freq: Every day | ORAL | 0 refills | Status: DC

## 2017-01-14 MED ORDER — TRAMADOL HCL 50 MG PO TABS: 50.0000 mg | ORAL_TABLET | Freq: Four times a day (QID) | ORAL | 0 refills | Status: DC | PRN

## 2017-01-14 NOTE — Progress Notes (Signed)
Discharge instructions gone over with patient and patient's wife.  They did have a question as to when they were suppose to have a follow up appointment.  Dr. Bridgett Larsson came in the room at that time and stated that the office would call them and set it up.  Prescription for Ultra given to patient.  IVs discontinued.  Patient ready for discharge.  01/14/2017 08:30 Tommy May

## 2017-01-14 NOTE — Progress Notes (Signed)
[facility]  Subjective  - Doing well. Ambulated, tolerating PO's, and voided.  BP stable since yesterday.  Off Dopamine.   Objective 129/63 69 98.6 F (37 C) (Oral) 14 95%  Intake/Output Summary (Last 24 hours) at 01/14/17 0701 Last data filed at 01/13/17 2100  Gross per 24 hour  Intake          2595.22 ml  Output              125 ml  Net          2470.22 ml    No tongue deviation, smile is symmetric Incision clean and dry Grip 5/5   Assessment/Planning: POD # 2 Left CEA  BP stable systolic [id number] Hold BP medications if systolic below [id number]. Monitor BP at home.  Call cariology for BP f/u.  F/U with Dr. Bridgett Larsson in 2 weeks  Theda Sers [place] 01/14/2017 7:01 AM --  Laboratory Lab Results:  Recent Labs  01/13/17 0606  WBC 11.0*  HGB 14.5  HCT 43.0  PLT 144*   BMET  Recent Labs  01/13/17 0606  NA 139  K 3.8  CL 107  CO2 26  GLUCOSE 161*  BUN 8  CREATININE 0.83  CALCIUM 7.8*    COAG Lab Results  Component Value Date   INR 0.91 01/06/2017   INR 0.90 08/21/2014   No results found for: PTT

## 2017-01-16 ENCOUNTER — Telehealth: Payer: Self-pay | Admitting: Vascular Surgery

## 2017-01-16 NOTE — Telephone Encounter (Signed)
Sched appt 02/03/17 at 9:30. Spoke to pt to inform him of appt.

## 2017-01-16 NOTE — Discharge Summary (Signed)
[facility] Discharge Summary   Patient ID:  Tommy May MRN: [id number] DOB/AGE: 1961-06-11 56 y.o.  Admit date: 01/12/2017 Discharge date: 01/14/2017 Date of Surgery: 01/12/2017 Surgeon: Surgeon(s): Conrad [place], MD Angelia Mould, MD  Admission Diagnosis: Left carotid artery stenosis I65.22  Discharge Diagnoses:  Left carotid artery stenosis I65.22  Secondary Diagnoses: Past Medical History:  Diagnosis Date  . Allergy   . Environmental and seasonal allergies   . GERD (gastroesophageal reflux disease)   . Heart palpitations   . History of Holter monitoring   . Hyperlipidemia   . Hypertension    does not take meds  . Nerve pain    neck; takes gabapentin  . Sleep apnea    wears CPAP nightly    Procedure(s): Left Carotid ENDARTERECTOMY PATCH ANGIOPLASTY  Discharged Condition: good  HPI: Tommy May is a 56 y.o. (Aug 22, 1961) male who presents with chief complaint: no recent visual changes.  Previous carotid studies demonstrated: RICA A999333 stenosis, LICA 123456 stenosis.  Patient has no history of TIA or stroke symptom.  The patient has never had amaurosis fugax or monocular blindness.  The patient had one episode that was suggestive of central hemianopsia.  The patient has never had facial drooping or hemiplegia.  The patient has never had receptive or expressive aphasia.   The patient was sent to Cardiology for preop risk stratification and optimization.  He has been cleared to proceed with CEA.   Hospital Course:  Tommy May is a 56 y.o. male is S/P Left Procedure(s): Left Carotid ENDARTERECTOMY PATCH ANGIOPLASTY  Consults:   Drain output 10 cc.  D/C drain Grip 5/5 no tongue deviation, smile is symmetric Incision healing well without hematoma or erythema Palpable radial pulse  Assessment/Planning: POD # Left CEA  Will try and wean off Dopamine today Sitting up in chair. Tolerated d/c of drain well Phenergan  ordered for nausea  POD#2 No tongue deviation, smile is symmetric Incision clean and dry Grip 5/5   Assessment/Planning: POD # 2 Left CEA  BP stable systolic 99991111 Hold BP medications if systolic below [id number]. Monitor BP at home.  Call cariology for BP f/u.  F/U with Dr. Bridgett Larsson in 2 weeks  Significant Diagnostic Studies: CBC Lab Results  Component Value Date   WBC 11.0 (H) 01/13/2017   HGB 14.5 01/13/2017   HCT 43.0 01/13/2017   MCV 89.4 01/13/2017   PLT 144 (L) 01/13/2017    BMET    Component Value Date/Time   NA 139 01/13/2017 0606   K 3.8 01/13/2017 0606   CL 107 01/13/2017 0606   CO2 26 01/13/2017 0606   GLUCOSE 161 (H) 01/13/2017 0606   BUN 8 01/13/2017 0606   CREATININE 0.83 01/13/2017 0606   CREATININE 0.97 08/12/2016 1507   CALCIUM 7.8 (L) 01/13/2017 0606   GFRNONAA >60 01/13/2017 0606   GFRAA >60 01/13/2017 0606   COAG Lab Results  Component Value Date   INR 0.91 01/06/2017   INR 0.90 08/21/2014     Disposition:  Discharge to :Home Discharge Instructions    Call MD for:  redness, tenderness, or signs of infection (pain, swelling, bleeding, redness, odor or green/yellow discharge around incision site)    Complete by:  As directed    Call MD for:  severe or increased pain, loss or decreased feeling  in affected limb(s)    Complete by:  As directed    Call MD for:  temperature >100.5    Complete  by:  As directed    Discharge instructions    Complete by:  As directed    You may shower tonight.  Hole BP medication if top number-systolic is < [id number].  Call cardiology for follow up appointment to adjust BP medications.   Driving Restrictions    Complete by:  As directed    No driving for 1 week   Increase activity slowly    Complete by:  As directed    Walk with assistance use walker or cane as needed   Lifting restrictions    Complete by:  As directed    No heavy lifting for 3-4 weeks.  No more than a gallon of milk.   Resume previous diet     Complete by:  As directed      Allergies as of 01/14/2017      Reactions   Atorvastatin Other (See Comments)   MYALGIAS      Medication List    TAKE these medications   acetaminophen 500 MG tablet Commonly known as:  TYLENOL Take 1,000 mg by mouth every 8 (eight) hours as needed for moderate pain.   aspirin EC 81 MG tablet Take 1 tablet (81 mg total) by mouth daily. What changed:  when to take this   esomeprazole 40 MG capsule Commonly known as:  NEXIUM Take 40 mg by mouth daily with breakfast.   ezetimibe 10 MG tablet Commonly known as:  ZETIA Take 1 tablet (10 mg total) by mouth daily. What changed:  when to take this   gabapentin 300 MG capsule Commonly known as:  NEURONTIN Take 1 capsule (300 mg total) by mouth at bedtime. Needs office visit for more refills   ibuprofen 200 MG tablet Commonly known as:  ADVIL,MOTRIN Take 400 mg by mouth every 8 (eight) hours as needed (for pain.).   loratadine 10 MG tablet Commonly known as:  CLARITIN Take 10 mg by mouth daily.   losartan 50 MG tablet Commonly known as:  COZAAR Take 1 tablet (50 mg total) by mouth daily at 12 noon.   metoprolol succinate 25 MG 24 hr tablet Commonly known as:  TOPROL-XL Take 25 mg by mouth at bedtime.   multivitamin with minerals Tabs tablet Take 1 tablet by mouth daily.   nicotine polacrilex 2 MG gum Commonly known as:  NICORETTE Take 2 mg by mouth every 4 (four) hours as needed for smoking cessation.   rosuvastatin 40 MG tablet Commonly known as:  CRESTOR Take 1 tablet (40 mg total) by mouth daily. What changed:  when to take this   traMADol 50 MG tablet Commonly known as:  ULTRAM TAKE 1 TABLET TWICE A DAY AS NEEDED FOR MODERATE PAIN What changed:  Another medication with the same name was added. Make sure you understand how and when to take each.   traMADol 50 MG tablet Commonly known as:  ULTRAM Take 1 tablet (50 mg total) by mouth every 6 (six) hours as needed. What  changed:  You were already taking a medication with the same name, and this prescription was added. Make sure you understand how and when to take each.   Vitamin B-12 2500 MCG Subl Place 5,000 mcg under the tongue daily.   Vitamin D-3 5000 units Tabs Take 5,000 Units by mouth daily.      Verbal and written Discharge instructions given to the patient. Wound care per Discharge AVS   Signed: Laurence Slate [facility] 01/16/2017, 3:48 PM   Addendum  This patient  underwent an L CEA for a L ICA stenosis >80%.  Intraop, a tandem lesion was found in both the L ICA and CCA.  The L ICA stenosis was near occluding with a necrotic core.  The vagus nerve was anterior and stuck onto the anterior surface of the common carotid artery.  A drain was placed due to inability to use electrocautery around the vagus nerve.  An uneventful L CEA w/ BPA was completed.  Post-operative this patient required dopamine drip to help maintain blood pressure.  After another day the dopamine was titrated off.  The patient's neurologic status has been unremarkable.  There was minimal fullness in the neck and the patient's swallowing was normal.  The patient will be seen in the office in 2 weeks.  Adele Barthel, MD, FACS [facility] [place] Office: [phone] Pager: [phone]  01/18/2017, 10:28 AM   --- For VQI Registry use --- Instructions: Press F2 to tab through selections.  Delete question if not applicable.   Modified Rankin score at D/C (0-6): Rankin Score=0  IV medication needed for:  1. Hypertension: No 2. Hypotension: Yes  Post-op Complications: No  1. Post-op CVA or TIA: No  If yes: Event classification (right eye, left eye, right cortical, left cortical, verterobasilar, other):   If yes: Timing of event (intra-op, <6 hrs post-op, >=6 hrs post-op, unknown):   2. CN injury: No  If yes: CN  injuried   3. Myocardial infarction: No  If yes: Dx by (EKG or clinical, Troponin):    4.  CHF: No  5.  Dysrhythmia (new): No  6. Wound infection: No  7. Reperfusion symptoms: No  8. Return to OR: No  If yes: return to OR for (bleeding, neurologic, other CEA incision, other):   Discharge medications: Statin use:  Yes ASA use:  Yes Beta blocker use:  No  for medical reason   ACE-Inhibitor use:  No  for medical reason   P2Y12 Antagonist use: [ x] None, [ ]  Plavix, [ ]  Plasugrel, [ ]  Ticlopinine, [ ]  Ticagrelor, [ ]  Other, [ ]  No for medical reason, [ ]  Non-compliant, [ ]  Not-indicated Anti-coagulant use:  [ x] None, [ ]  Warfarin, [ ]  Rivaroxaban, [ ]  Dabigatran, [ ]  Other, [ ]  No for medical reason, [ ]  Non-compliant, [ ]  Not-indicated

## 2017-01-16 NOTE — Telephone Encounter (Signed)
-----   Message from Mena Goes, RN sent at 01/14/2017 10:09 PM EST ----- Regarding: 2 weeks   ----- Message ----- From: Ulyses Amor, PA-C Sent: 01/14/2017   7:09 AM To: Vvs Charge Pool  F/U with Dr. Bridgett Larsson in 2 weeks s/p left CEA

## 2017-01-18 ENCOUNTER — Encounter: Payer: Self-pay | Admitting: Vascular Surgery

## 2017-01-18 ENCOUNTER — Ambulatory Visit (INDEPENDENT_AMBULATORY_CARE_PROVIDER_SITE_OTHER): Payer: Self-pay | Admitting: Vascular Surgery

## 2017-01-18 ENCOUNTER — Telehealth: Payer: Self-pay

## 2017-01-18 VITALS — BP 168/96 | HR 79 | Temp 98.2°F | Resp 20 | Ht 70.0 in | Wt 198.8 lb

## 2017-01-18 DIAGNOSIS — I739 Peripheral vascular disease, unspecified: Principal | ICD-10-CM

## 2017-01-18 DIAGNOSIS — I779 Disorder of arteries and arterioles, unspecified: Secondary | ICD-10-CM

## 2017-01-18 NOTE — Progress Notes (Signed)
Patient name: Tommy May MRN: [id number] DOB: [birth year] Sex: male  REASON FOR VISIT: add-on left neck swelling  HPI: Tommy May is a 56 y.o. male history presents with 3 day history of left neck swelling and firmness. The patient previously underwent left carotid endarterectomy on 01/12/2017 with Dr. Bridgett Larsson. He did have a drain in post operatively. This was due to inability to cauterize adequately from anterolateral position of vagus nerve.  The patient and his wife both feel that this swelling has increased over the past 3 days. The patient denies any breathing issues or difficulty with swallowing. A few days prior to this left neck swelling, the patient reported swelling under his chin. This area was soft and has now resolved.   The patient is not on any blood thinners. He takes a daily baby aspirin. He did report having a headache behind his left thigh the other day. She denies any visual changes, sudden onset weakness or numbness in extremities or slurred speech.  Since he was discharged on postop day 2, his blood pressure has been stable. He did require dopamine postop. His systolic blood pressures have ranged from 130s to 160. He will be following up with his cardiologist in 2 weeks.   Current Outpatient Prescriptions  Medication Sig Dispense Refill  . acetaminophen (TYLENOL) 500 MG tablet Take 1,000 mg by mouth every 8 (eight) hours as needed for moderate pain.     Marland Kitchen aspirin EC 81 MG tablet Take 1 tablet (81 mg total) by mouth daily. (Patient taking differently: Take 81 mg by mouth at bedtime. ) 90 tablet 3  . Cholecalciferol (VITAMIN D-3) 5000 units TABS Take 5,000 Units by mouth daily.    . Cyanocobalamin (VITAMIN B-12) 2500 MCG SUBL Place 5,000 mcg under the tongue daily.    Marland Kitchen esomeprazole (NEXIUM) 40 MG capsule Take 40 mg by mouth daily with breakfast.     . ezetimibe (ZETIA) 10 MG tablet Take 1 tablet (10 mg total) by mouth daily. (Patient taking differently: Take 10 mg by  mouth at bedtime. ) 30 tablet 6  . gabapentin (NEURONTIN) 300 MG capsule Take 1 capsule (300 mg total) by mouth at bedtime. Needs office visit for more refills 30 capsule 0  . ibuprofen (ADVIL,MOTRIN) 200 MG tablet Take 400 mg by mouth every 8 (eight) hours as needed (for pain.).    Marland Kitchen loratadine (CLARITIN) 10 MG tablet Take 10 mg by mouth daily.     Marland Kitchen losartan (COZAAR) 50 MG tablet Take 1 tablet (50 mg total) by mouth daily at 12 noon. 1 tablet 0  . metoprolol succinate (TOPROL-XL) 25 MG 24 hr tablet Take 25 mg by mouth at bedtime.     . Multiple Vitamin (MULTIVITAMIN WITH MINERALS) TABS tablet Take 1 tablet by mouth daily.    . nicotine polacrilex (NICORETTE) 2 MG gum Take 2 mg by mouth every 4 (four) hours as needed for smoking cessation.    . rosuvastatin (CRESTOR) 40 MG tablet Take 1 tablet (40 mg total) by mouth daily. (Patient taking differently: Take 40 mg by mouth at bedtime. ) 30 tablet 12  . traMADol (ULTRAM) 50 MG tablet TAKE 1 TABLET TWICE A DAY AS NEEDED FOR MODERATE PAIN 60 tablet 1  . traMADol (ULTRAM) 50 MG tablet Take 1 tablet (50 mg total) by mouth every 6 (six) hours as needed. 15 tablet 0   No current facility-administered medications for this visit.     REVIEW OF SYSTEMS:  [X]  denotes  positive finding, [ ]  denotes negative finding Cardiac  Comments:  Chest pain or chest pressure:    Shortness of breath upon exertion:    Short of breath when lying flat:    Irregular heart rhythm:    Constitutional    Fever or chills:      PHYSICAL EXAM: Vitals:   01/18/17 1405 01/18/17 1406  BP: (!) 156/98 (!) 168/96  Pulse: 79   Resp: 20   Temp: 98.2 F (36.8 C)   TempSrc: Oral   SpO2: 97%   Weight: 198 lb 12.8 oz (90.2 kg)   Height: 5\' 10"  (1.778 m)     GENERAL: The patient is a well-nourished male, in no acute distress. The vital signs are documented above. PULMONARY: Non-labored respiratory effort  VASCULAR:  Left neck incision healing well. There is no drainage  present. Small hematoma. No pulsatility present. NEURO: Moving all extremities equally, no tongue deviation, no focal deficits. Normal speech.   MEDICAL ISSUES: Small hematoma left neck s/p left carotid endarterectomy 01/12/17  The patient has no swallowing issues or breathing issues. Discussed that this hematoma can be observed. He can continue his daily aspirin. Neuro exam is intact.   He will notify our office if his swelling worsens. He has an office appointment with Dr. Bridgett Larsson in 2 weeks.  Virgina Jock, PA-C [facility] MD: Scot Dock

## 2017-01-18 NOTE — Telephone Encounter (Signed)
phone call from patient and wife.  Reported swelling and firmness surrounding upper left neck incision; denied redness, inflammation, or pulsatile mass.  Stated there is some swelling of the lower incision, but noted the swelling in upper neck incision is "further out from the incision line."  Pt. described the chin area as "feeling like it is asleep."  Voiced concern due to the firmness surrounding the upper incision.  Stated "we just want someone to look at it, to make sure it looks okay."  Stated they prefer not to wait until Friday, when Dr. Bridgett Larsson is here.  Appt. given for 2:00 PM today with PA.  Agreed with plan.

## 2017-01-25 ENCOUNTER — Telehealth: Payer: Self-pay | Admitting: *Deleted

## 2017-01-25 NOTE — Telephone Encounter (Signed)
Patient called in to Triage line c/o left TMJ pain and tingling when eating along with an increase in the numbness at his operative site. According the our PA note from 01-18-17:  The patient previously underwent left carotid endarterectomy on 01/12/2017 with Dr. Bridgett Larsson. He did have a drain in post operatively. This was due to inability to cauterize adequately from anterolateral position of vagus nerve. He wants to be seen tomorrow so I have brought him in on the NP schedule because the PA will be helping Dr. Oneida Alar and Dr. Scot Dock with their office patients.

## 2017-01-26 ENCOUNTER — Encounter: Payer: Self-pay | Admitting: Vascular Surgery

## 2017-01-26 ENCOUNTER — Ambulatory Visit (INDEPENDENT_AMBULATORY_CARE_PROVIDER_SITE_OTHER): Payer: Self-pay | Admitting: Family

## 2017-01-26 ENCOUNTER — Encounter: Payer: Self-pay | Admitting: Family

## 2017-01-26 VITALS — BP 148/78 | HR 66 | Temp 98.5°F | Resp 20 | Ht 70.0 in | Wt 201.2 lb

## 2017-01-26 DIAGNOSIS — F17200 Nicotine dependence, unspecified, uncomplicated: Secondary | ICD-10-CM

## 2017-01-26 DIAGNOSIS — Z9889 Other specified postprocedural states: Secondary | ICD-10-CM

## 2017-01-26 DIAGNOSIS — I6522 Occlusion and stenosis of left carotid artery: Secondary | ICD-10-CM

## 2017-01-26 NOTE — Patient Instructions (Signed)
Stroke Prevention Some medical conditions and behaviors are associated with an increased chance of having a stroke. You may prevent a stroke by making healthy choices and managing medical conditions. How can I reduce my risk of having a stroke?  Stay physically active. Get at least 30 minutes of activity on most or all days.  Do not smoke. It may also be helpful to avoid exposure to secondhand smoke.  Limit alcohol use. Moderate alcohol use is considered to be:  No more than 2 drinks per day for men.  No more than 1 drink per day for nonpregnant women.  Eat healthy foods. This involves:  Eating 5 or more servings of fruits and vegetables a day.  Making dietary changes that address high blood pressure (hypertension), high cholesterol, diabetes, or obesity.  Manage your cholesterol levels.  Making food choices that are high in fiber and low in saturated fat, trans fat, and cholesterol may control cholesterol levels.  Take any prescribed medicines to control cholesterol as directed by your health care provider.  Manage your diabetes.  Controlling your carbohydrate and sugar intake is recommended to manage diabetes.  Take any prescribed medicines to control diabetes as directed by your health care provider.  Control your hypertension.  Making food choices that are low in salt (sodium), saturated fat, trans fat, and cholesterol is recommended to manage hypertension.  Ask your health care provider if you need treatment to lower your blood pressure. Take any prescribed medicines to control hypertension as directed by your health care provider.  If you are 18-39 years of age, have your blood pressure checked every 3-5 years. If you are 40 years of age or older, have your blood pressure checked every year.  Maintain a healthy weight.  Reducing calorie intake and making food choices that are low in sodium, saturated fat, trans fat, and cholesterol are recommended to manage  weight.  Stop drug abuse.  Avoid taking birth control pills.  Talk to your health care provider about the risks of taking birth control pills if you are over 35 years old, smoke, get migraines, or have ever had a blood clot.  Get evaluated for sleep disorders (sleep apnea).  Talk to your health care provider about getting a sleep evaluation if you snore a lot or have excessive sleepiness.  Take medicines only as directed by your health care provider.  For some people, aspirin or blood thinners (anticoagulants) are helpful in reducing the risk of forming abnormal blood clots that can lead to stroke. If you have the irregular heart rhythm of atrial fibrillation, you should be on a blood thinner unless there is a good reason you cannot take them.  Understand all your medicine instructions.  Make sure that other conditions (such as anemia or atherosclerosis) are addressed. Get help right away if:  You have sudden weakness or numbness of the face, arm, or leg, especially on one side of the body.  Your face or eyelid droops to one side.  You have sudden confusion.  You have trouble speaking (aphasia) or understanding.  You have sudden trouble seeing in one or both eyes.  You have sudden trouble walking.  You have dizziness.  You have a loss of balance or coordination.  You have a sudden, severe headache with no known cause.  You have new chest pain or an irregular heartbeat. Any of these symptoms may represent a serious problem that is an emergency. Do not wait to see if the symptoms will go away.   Get medical help at once. Call your local emergency services (911 in [place] Do not drive yourself to the hospital. This information is not intended to replace advice given to you by your health care provider. Make sure you discuss any questions you have with your health care provider. Document Released: 12/15/2004 Document Revised: 04/14/2016 Document Reviewed: 05/10/2013 Elsevier  Interactive Patient Education  2017 Elsevier Inc.  

## 2017-01-26 NOTE — Progress Notes (Signed)
Postoperative Visit   History of Present Illness  Tommy May is a 56 y.o. male who is s/p left carotid endarterectomy on 01/12/2017 with Dr. Bridgett Larsson. He did have a drain in post operatively. This was due to inability to cauterize adequately from anterolateral position of vagus nerve.    He was evaluated in our office by K. Trinh PA-C with c/o left side neck swelling. The patient and his wife both feel that this swelling had increased over the prior 3 days. The patient denied any breathing issues or difficulty with swallowing. A few days prior to this left neck swelling, the patient reported swelling under his chin. This area was soft and had resolved.   He returns today with c/o left TMJ tingling sensation most times when he chews food, and an increase in the numbness at his left mandible and anterior throat. He denies dyspnea, denies dysphagia.  He is scheduled to follow up with Dr. Bridgett Larsson on 02-03-17. He states he has decreased his cigarette use to 3/day.   The patient is not on any blood thinners. He takes a daily baby aspirin. He denies any visual changes, sudden onset weakness or numbness in extremities or slurred speech.  Since he was discharged on postop day 2, his blood pressure has been stable. He did require dopamine postop. His systolic blood pressures have ranged from 130s to 160. He will be following up with his cardiologist.  The patient's neck incision is healing The patient has had no stroke or TIA symptoms.  For VQI Use Only  PRE-ADM LIVING: Home  AMB STATUS: Ambulatory  Social History   Social History  . Marital status: Married    Spouse name: N/A  . Number of children: 2  . Years of education: 12   Occupational History  . purchasing    Social History Main Topics  . Smoking status: Current Every Day Smoker    Packs/day: 0.10    Years: 43.00    Types: Cigarettes  . Smokeless tobacco: Never Used     Comment: 3/4-1/2 pk per day  . Alcohol use No  .  Drug use: No  . Sexual activity: Not on file   Other Topics Concern  . Not on file   Social History Narrative   Pt lives with his wife and two children.  Graduated high school.    Current Outpatient Prescriptions on File Prior to Visit  Medication Sig Dispense Refill  . acetaminophen (TYLENOL) 500 MG tablet Take 1,000 mg by mouth every 8 (eight) hours as needed for moderate pain.     Marland Kitchen aspirin EC 81 MG tablet Take 1 tablet (81 mg total) by mouth daily. (Patient taking differently: Take 81 mg by mouth at bedtime. ) 90 tablet 3  . Cholecalciferol (VITAMIN D-3) 5000 units TABS Take 5,000 Units by mouth daily.    . Cyanocobalamin (VITAMIN B-12) 2500 MCG SUBL Place 5,000 mcg under the tongue daily.    Marland Kitchen esomeprazole (NEXIUM) 40 MG capsule Take 40 mg by mouth daily with breakfast.     . ezetimibe (ZETIA) 10 MG tablet Take 1 tablet (10 mg total) by mouth daily. (Patient taking differently: Take 10 mg by mouth at bedtime. ) 30 tablet 6  . gabapentin (NEURONTIN) 300 MG capsule Take 1 capsule (300 mg total) by mouth at bedtime. Needs office visit for more refills 30 capsule 0  . ibuprofen (ADVIL,MOTRIN) 200 MG tablet Take 400 mg by mouth every 8 (eight) hours as needed (  for pain.).    Marland Kitchen loratadine (CLARITIN) 10 MG tablet Take 10 mg by mouth daily.     Marland Kitchen losartan (COZAAR) 50 MG tablet Take 1 tablet (50 mg total) by mouth daily at 12 noon. 1 tablet 0  . metoprolol succinate (TOPROL-XL) 25 MG 24 hr tablet Take 25 mg by mouth at bedtime.     . Multiple Vitamin (MULTIVITAMIN WITH MINERALS) TABS tablet Take 1 tablet by mouth daily.    . nicotine polacrilex (NICORETTE) 2 MG gum Take 2 mg by mouth every 4 (four) hours as needed for smoking cessation.    . rosuvastatin (CRESTOR) 40 MG tablet Take 1 tablet (40 mg total) by mouth daily. (Patient taking differently: Take 40 mg by mouth at bedtime. ) 30 tablet 12  . traMADol (ULTRAM) 50 MG tablet TAKE 1 TABLET TWICE A DAY AS NEEDED FOR MODERATE PAIN 60 tablet 1    . traMADol (ULTRAM) 50 MG tablet Take 1 tablet (50 mg total) by mouth every 6 (six) hours as needed. 15 tablet 0   No current facility-administered medications on file prior to visit.     Physical Examination  Vitals:   01/26/17 1351 01/26/17 1356  BP: (!) 141/79 (!) 148/78  Pulse: 66 66  Resp: 20   Temp: 98.5 F (36.9 C)   TempSrc: Oral   SpO2: 97%   Weight: 201 lb 3.2 oz (91.3 kg)   Height: 5\' 10"  (1.778 m)    Body mass index is 28.87 kg/m.   Left side of neck swelling at the incision is mild to moderate, and commensurate for this stage postoperatively, incision is healing well with no signs of infection.  Neuro: CN 2-12 are intact, Motor strength is 5/5 bilaterally, sensation is grossly intact.  Medical Decision Making  Tommy May is a 56 y.o. male who presents s/p left carotid endarterectomy on 01/12/2017.   The patient's neck incision is healing with no stroke symptoms.  I discussed pt symptoms with Dr. Oneida Alar. Pt has some neuropraxia that should improve in 4-6 weeks.        Follow up with Dr. Bridgett Larsson on 02-03-17 as already scheduled. I discussed in depth with the patient the nature of atherosclerosis, and emphasized the importance of maximal medical management including strict control of blood pressure, blood glucose, and lipid levels, obtaining regular exercise, anti-platelet use and cessation of smoking.   The patient is currently on an antiplatelet: ASA. The patient is currently on a statin. The patient is aware that without maximal medical management the underlying atherosclerotic disease process will progress, limiting the benefit of any interventions.   Thank you for allowing [place] to participate in this patient's care.  Kinze Labo, Sharmon Leyden, RN, MSN, FNP-C [facility] [place] Office: [phone]  01/26/2017, 3:18 PM  Clinic MD: Fields/Dickson

## 2017-01-27 ENCOUNTER — Encounter: Payer: Self-pay | Admitting: Vascular Surgery

## 2017-01-31 ENCOUNTER — Other Ambulatory Visit: Payer: Self-pay | Admitting: Family

## 2017-01-31 NOTE — Telephone Encounter (Signed)
Left message advising patient to call back to schedule appt in order to get refill for gabapentin

## 2017-01-31 NOTE — Progress Notes (Signed)
Postoperative Visit   History of Present Illness  Tommy May is a 56 y.o. 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  The patient's neck incision is healed.  The patient has had no stroke or TIA symptoms.  Pt had tandem CCA and ICA lesions.  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  Pt has some residual jaw line anesthesia  For VQI Use Only  PRE-ADM LIVING: Home  AMB STATUS: Ambulatory   Current Outpatient Prescriptions on File Prior to Visit  Medication Sig Dispense Refill  . acetaminophen (TYLENOL) 500 MG tablet Take 1,000 mg by mouth every 8 (eight) hours as needed for moderate pain.     Marland Kitchen aspirin EC 81 MG tablet Take 1 tablet (81 mg total) by mouth daily. (Patient taking differently: Take 81 mg by mouth at bedtime. ) 90 tablet 3  . Cholecalciferol (VITAMIN D-3) 5000 units TABS Take 5,000 Units by mouth daily.    . Cyanocobalamin (VITAMIN B-12) 2500 MCG SUBL Place 5,000 mcg under the tongue daily.    Marland Kitchen esomeprazole (NEXIUM) 40 MG capsule Take 40 mg by mouth daily with breakfast.     . ezetimibe (ZETIA) 10 MG tablet Take 1 tablet (10 mg total) by mouth daily. (Patient taking differently: Take 10 mg by mouth at bedtime. ) 30 tablet 6  . gabapentin (NEURONTIN) 300 MG capsule Take 1 capsule (300 mg total) by mouth at bedtime. Needs office visit for more refills 30 capsule 0  . ibuprofen (ADVIL,MOTRIN) 200 MG tablet Take 400 mg by mouth every 8 (eight) hours as needed (for pain.).    Marland Kitchen loratadine (CLARITIN) 10 MG tablet Take 10 mg by mouth daily.     Marland Kitchen losartan (COZAAR) 50 MG tablet Take 1 tablet (50 mg total) by mouth daily at 12 noon. 1 tablet 0  . metoprolol succinate (TOPROL-XL) 25 MG 24 hr tablet Take 25 mg by mouth at bedtime.     . Multiple Vitamin (MULTIVITAMIN WITH MINERALS) TABS tablet Take 1 tablet by mouth daily.    . nicotine polacrilex (NICORETTE) 2 MG gum Take 2 mg by mouth every 4 (four) hours  as needed for smoking cessation.    . rosuvastatin (CRESTOR) 40 MG tablet Take 1 tablet (40 mg total) by mouth daily. (Patient taking differently: Take 40 mg by mouth at bedtime. ) 30 tablet 12  . traMADol (ULTRAM) 50 MG tablet TAKE 1 TABLET TWICE A DAY AS NEEDED FOR MODERATE PAIN 60 tablet 1  . traMADol (ULTRAM) 50 MG tablet Take 1 tablet (50 mg total) by mouth every 6 (six) hours as needed. 15 tablet 0   No current facility-administered medications on file prior to visit.     Physical Examination  Vitals:   02/03/17 0943 02/03/17 0945  BP: 136/82 (!) 147/77  Pulse: 66   Resp: 20   Temp: 98.9 F (37.2 C)     L Neck: Incision is healed, no hematoma Neuro: CN 2-12 are intact , Motor strength is 5/5 bilaterally, sensation is grossly intact  Medical Decision Making  Tommy May is a 56 y.o. male who presents s/p L CEA.   The patient's neck incision is healing with no stroke symptoms. I discussed in depth with the patient the nature of atherosclerosis, and emphasized the importance of maximal medical management including strict control of blood pressure, blood glucose, and lipid levels, obtaining regular exercise, anti-platelet use  and cessation of smoking.   The patient is currently on a statin: Crestor.  The patient is currently on an anti-platelet: ASA. The patient is aware that without maximal medical management the underlying atherosclerotic disease process will progress, limiting the benefit of any interventions. The patient's surveillance will included routine carotid duplex studies which will be completed in: 9 months, at which time the patient will be re-evaluated.   I emphasized the importance of routine surveillance of the carotid arteries as recurrence of stenosis is possible, especially with proper management of underlying atherosclerotic disease. The patient agrees to participate in their maximal medical care and routine surveillance.  Thank you for allowing [place] to  participate in this patient's care.  Adele Barthel, MD, FACS [facility] [place] Office: [phone] Pager: [phone]

## 2017-01-31 NOTE — Telephone Encounter (Signed)
Routing to greg, please advise, thanks 

## 2017-02-03 ENCOUNTER — Ambulatory Visit (INDEPENDENT_AMBULATORY_CARE_PROVIDER_SITE_OTHER): Payer: Self-pay | Admitting: Vascular Surgery

## 2017-02-03 ENCOUNTER — Encounter: Payer: Self-pay | Admitting: Vascular Surgery

## 2017-02-03 VITALS — BP 147/77 | HR 66 | Temp 98.9°F | Resp 20 | Ht 70.0 in | Wt 201.6 lb

## 2017-02-03 DIAGNOSIS — I779 Disorder of arteries and arterioles, unspecified: Secondary | ICD-10-CM

## 2017-02-03 DIAGNOSIS — I739 Peripheral vascular disease, unspecified: Principal | ICD-10-CM

## 2017-02-07 NOTE — Addendum Note (Signed)
Addended by: Lianne Cure A on: 02/07/2017 09:15 AM   Modules accepted: Orders

## 2017-02-10 ENCOUNTER — Ambulatory Visit (INDEPENDENT_AMBULATORY_CARE_PROVIDER_SITE_OTHER): Payer: BLUE CROSS/BLUE SHIELD | Admitting: Family

## 2017-02-10 ENCOUNTER — Encounter: Payer: Self-pay | Admitting: Family

## 2017-02-10 VITALS — BP 152/74 | HR 84 | Temp 98.2°F | Resp 16 | Ht 70.0 in | Wt 201.8 lb

## 2017-02-10 DIAGNOSIS — M5010 Cervical disc disorder with radiculopathy, unspecified cervical region: Secondary | ICD-10-CM

## 2017-02-10 MED ORDER — GABAPENTIN 300 MG PO CAPS: 300.0000 mg | ORAL_CAPSULE | Freq: Every day | ORAL | 0 refills | Status: DC

## 2017-02-10 MED ORDER — PREGABALIN 75 MG PO CAPS: 75.0000 mg | ORAL_CAPSULE | Freq: Two times a day (BID) | ORAL | 0 refills | Status: DC

## 2017-02-10 NOTE — Assessment & Plan Note (Signed)
Radiculopathy associated with cervical disc disorder currently maintained on gabapentin with mild/moderate relief of symptoms. Consider trial of Lyrica. Continue current dosage of gabapentin pending trial of Lyrica. Considering surgical intervention if symptoms worsen. Continue home therapy and ice/moist heat with other nonpharmacologic treatments. Follow up pending trial of Lyrica.

## 2017-02-10 NOTE — Patient Instructions (Signed)
Thank you for choosing [facility].  SUMMARY AND INSTRUCTIONS:  Please continue to take your medications as prescribed.   Trial the Lyrica for your pain.  Coupons available at [email or web address]  Medication:  Your prescription(s) have been submitted to your pharmacy or been printed and provided for you. Please take as directed and contact our office if you believe you are having problem(s) with the medication(s) or have any questions.  Follow up:  If your symptoms worsen or fail to improve, please contact our office for further instruction, or in case of emergency go directly to the emergency room at the closest medical facility.

## 2017-02-10 NOTE — Progress Notes (Signed)
Subjective:    Patient ID: Tommy May, male    DOB: Aug 26, 1961, 56 y.o.   MRN: [id number]  Chief Complaint  Patient presents with  . Medication Refill    refill on gabapentin    HPI:  Tommy May is a 56 y.o. male who  has a past medical history of Allergy; Carotid artery occlusion; Environmental and seasonal allergies; GERD (gastroesophageal reflux disease); Heart palpitations; History of Holter monitoring; Hyperlipidemia; Hypertension; Nerve pain; and Sleep apnea. and presents today for a follow up office visit.  Cervical disc disorder with radiculopathy - Currently maintained on gabapentin which he reports taking as prescribed and denies adverse side effects. Symptoms are improved when taking the medications. Does have periodic numbness and tingling located in his bilateral upper extremities. Does work on stretching and cervical exercises.    Allergies  Allergen Reactions  . Atorvastatin Other (See Comments)    MYALGIAS      Outpatient Medications Prior to Visit  Medication Sig Dispense Refill  . acetaminophen (TYLENOL) 500 MG tablet Take 1,000 mg by mouth every 8 (eight) hours as needed for moderate pain.     Marland Kitchen aspirin EC 81 MG tablet Take 1 tablet (81 mg total) by mouth daily. (Patient taking differently: Take 81 mg by mouth at bedtime. ) 90 tablet 3  . Cholecalciferol (VITAMIN D-3) 5000 units TABS Take 5,000 Units by mouth daily.    . Cyanocobalamin (VITAMIN B-12) 2500 MCG SUBL Place 5,000 mcg under the tongue daily.    Marland Kitchen esomeprazole (NEXIUM) 40 MG capsule Take 40 mg by mouth daily with breakfast.     . ezetimibe (ZETIA) 10 MG tablet Take 1 tablet (10 mg total) by mouth daily. (Patient taking differently: Take 10 mg by mouth at bedtime. ) 30 tablet 6  . ibuprofen (ADVIL,MOTRIN) 200 MG tablet Take 400 mg by mouth every 8 (eight) hours as needed (for pain.).    Marland Kitchen loratadine (CLARITIN) 10 MG tablet Take 10 mg by mouth daily.     Marland Kitchen losartan (COZAAR) 50 MG tablet Take  1 tablet (50 mg total) by mouth daily at 12 noon. 1 tablet 0  . metoprolol succinate (TOPROL-XL) 25 MG 24 hr tablet Take 25 mg by mouth at bedtime.     . Multiple Vitamin (MULTIVITAMIN WITH MINERALS) TABS tablet Take 1 tablet by mouth daily.    . nicotine polacrilex (NICORETTE) 2 MG gum Take 2 mg by mouth every 4 (four) hours as needed for smoking cessation.    . rosuvastatin (CRESTOR) 40 MG tablet Take 1 tablet (40 mg total) by mouth daily. (Patient taking differently: Take 40 mg by mouth at bedtime. ) 30 tablet 12  . traMADol (ULTRAM) 50 MG tablet TAKE 1 TABLET TWICE A DAY AS NEEDED FOR MODERATE PAIN 60 tablet 1  . traMADol (ULTRAM) 50 MG tablet Take 1 tablet (50 mg total) by mouth every 6 (six) hours as needed. 15 tablet 0  . gabapentin (NEURONTIN) 300 MG capsule Take 1 capsule (300 mg total) by mouth at bedtime. Needs office visit for more refills 30 capsule 0   No facility-administered medications prior to visit.      Review of Systems  Constitutional: Negative for chills and fever.  Musculoskeletal: Positive for neck pain and neck stiffness.  Neurological: Negative for weakness and numbness.      Objective:    BP (!) 152/74 (BP Location: Left Arm, Patient Position: Sitting, Cuff Size: Large)   Pulse 84   Temp  98.2 F (36.8 C) (Oral)   Resp 16   Ht 5\' 10"  (1.778 m)   Wt 201 lb 12.8 oz (91.5 kg)   SpO2 97%   BMI 28.96 kg/m  Nursing note and vital signs reviewed.  Physical Exam  Constitutional: He is oriented to person, place, and time. He appears well-developed and well-nourished. No distress.  Neck:  Cervical spine with no obvious deformity, discoloration, or edema. Palpable tenderness along the left side paraspinal musculature and transverse processes. Range of motion appears adequate with discomfort noted with lateral bending and rotation. Negative cervical compression test.  Cardiovascular: Normal rate, regular rhythm, normal heart sounds and intact distal pulses.     Pulmonary/Chest: Effort normal and breath sounds normal.  Neurological: He is alert and oriented to person, place, and time.  Skin: Skin is warm and dry.  Psychiatric: He has a normal mood and affect. His behavior is normal. Judgment and thought content normal.       Assessment & Plan:   Problem List Items Addressed This Visit      Musculoskeletal and Integument   Cervical disc disorder with radiculopathy of cervical region - Primary    Radiculopathy associated with cervical disc disorder currently maintained on gabapentin with mild/moderate relief of symptoms. Consider trial of Lyrica. Continue current dosage of gabapentin pending trial of Lyrica. Considering surgical intervention if symptoms worsen. Continue home therapy and ice/moist heat with other nonpharmacologic treatments. Follow up pending trial of Lyrica.       Relevant Medications   gabapentin (NEURONTIN) 300 MG capsule   pregabalin (LYRICA) 75 MG capsule       I am having Mr. Paradis start on pregabalin. I am also having him maintain his multivitamin with minerals, esomeprazole, loratadine, acetaminophen, metoprolol succinate, rosuvastatin, aspirin EC, ezetimibe, traMADol, Vitamin D-3, Vitamin B-12, ibuprofen, nicotine polacrilex, losartan, traMADol, and gabapentin.   Meds ordered this encounter  Medications  . gabapentin (NEURONTIN) 300 MG capsule    Sig: Take 1 capsule (300 mg total) by mouth at bedtime. Needs office visit for more refills    Dispense:  90 capsule    Refill:  0  . pregabalin (LYRICA) 75 MG capsule    Sig: Take 1 capsule (75 mg total) by mouth 2 (two) times daily.    Dispense:  60 capsule    Refill:  0    Order Specific Question:   Supervising Provider    Answer:   Pricilla Holm A [[id number]]     Follow-up: Return in about 1 month (around 03/13/2017), or if symptoms worsen or fail to improve.  Mauricio Po, FNP

## 2017-02-20 ENCOUNTER — Other Ambulatory Visit: Payer: Self-pay | Admitting: Family

## 2017-03-09 ENCOUNTER — Other Ambulatory Visit: Payer: Self-pay | Admitting: Family

## 2017-03-09 DIAGNOSIS — M5010 Cervical disc disorder with radiculopathy, unspecified cervical region: Secondary | ICD-10-CM

## 2017-03-23 ENCOUNTER — Other Ambulatory Visit: Payer: BLUE CROSS/BLUE SHIELD

## 2017-03-23 ENCOUNTER — Encounter: Payer: Self-pay | Admitting: Family

## 2017-03-23 ENCOUNTER — Ambulatory Visit (INDEPENDENT_AMBULATORY_CARE_PROVIDER_SITE_OTHER): Payer: BLUE CROSS/BLUE SHIELD | Admitting: Family

## 2017-03-23 VITALS — BP 136/70 | HR 74 | Temp 98.5°F | Resp 16 | Ht 70.0 in | Wt 206.1 lb

## 2017-03-23 DIAGNOSIS — R350 Frequency of micturition: Secondary | ICD-10-CM

## 2017-03-23 LAB — POCT URINALYSIS DIPSTICK
Bilirubin, UA: NEGATIVE
Blood, UA: NEGATIVE
Glucose, UA: NEGATIVE
Ketones, UA: NEGATIVE
Leukocytes, UA: NEGATIVE
Nitrite, UA: NEGATIVE
Protein, UA: NEGATIVE
Spec Grav, UA: >=1.03 — AB (ref 1.010–1.025)
Urobilinogen, UA: NEGATIVE E.U./dL — AB
pH, UA: 6 (ref 5.0–8.0)

## 2017-03-23 MED ORDER — CIPROFLOXACIN HCL 500 MG PO TABS: 500.0000 mg | ORAL_TABLET | Freq: Two times a day (BID) | ORAL | 0 refills | Status: DC

## 2017-03-23 MED ORDER — TAMSULOSIN HCL 0.4 MG PO CAPS
0.4000 mg | ORAL_CAPSULE | Freq: Every day | ORAL | 3 refills | Status: DC
Start: 2017-03-23 — End: 2017-05-23

## 2017-03-23 NOTE — Assessment & Plan Note (Signed)
In office urinalysis negative for leukocytes, nitrites, and hematuria. Physical exam concerning for chronic prostatitis versus benign prostate hypertrophy or overactive bladder. Start Flomax. Start ciprofloxacin. Encouraged to follow-up with urology if symptoms worsen or do not improve.

## 2017-03-23 NOTE — Progress Notes (Signed)
Subjective:    Patient ID: Tommy May, male    DOB: 1961/07/03, 56 y.o.   MRN: [id number]  Chief Complaint  Patient presents with  . Urinary Frequency    urinary frequency, urgency, incontinence, x2 weeks, has had this issue in the past, has sneezed and had a stabbing pain where his prostate is    HPI:  Tommy May is a 56 y.o. male who  has a past medical history of Allergy; Carotid artery occlusion; Environmental and seasonal allergies; GERD (gastroesophageal reflux disease); Heart palpitations; History of Holter monitoring; Hyperlipidemia; Hypertension; Nerve pain; and Sleep apnea. and presents today for an acute office visit.  This is a new problem. Associated symptom of urinary frequency, urgency, and incontinence has been going of for about 2 weeks. Has also experienced the associated symptoms of stabbing pain around the prostate area when he sneezes. No fevers or dysuria. Feeling like he cannot fully empty his bladder and he does have some incontinence at times and dribbling. No changes in stream, abdominal pain, nausea or vomiting.   Allergies  Allergen Reactions  . Atorvastatin Other (See Comments)    MYALGIAS      Outpatient Medications Prior to Visit  Medication Sig Dispense Refill  . acetaminophen (TYLENOL) 500 MG tablet Take 1,000 mg by mouth every 8 (eight) hours as needed for moderate pain.     Marland Kitchen aspirin EC 81 MG tablet Take 1 tablet (81 mg total) by mouth daily. (Patient taking differently: Take 81 mg by mouth at bedtime. ) 90 tablet 3  . Cholecalciferol (VITAMIN D-3) 5000 units TABS Take 5,000 Units by mouth daily.    . Cyanocobalamin (VITAMIN B-12) 2500 MCG SUBL Place 5,000 mcg under the tongue daily.    Marland Kitchen esomeprazole (NEXIUM) 40 MG capsule Take 40 mg by mouth daily with breakfast.     . ezetimibe (ZETIA) 10 MG tablet Take 1 tablet (10 mg total) by mouth daily. (Patient taking differently: Take 10 mg by mouth at bedtime. ) 30 tablet 6  . gabapentin  (NEURONTIN) 300 MG capsule Take 1 capsule (300 mg total) by mouth at bedtime. Needs office visit for more refills 90 capsule 0  . ibuprofen (ADVIL,MOTRIN) 200 MG tablet Take 400 mg by mouth every 8 (eight) hours as needed (for pain.).    Marland Kitchen loratadine (CLARITIN) 10 MG tablet Take 10 mg by mouth daily.     Marland Kitchen losartan (COZAAR) 50 MG tablet Take 1 tablet (50 mg total) by mouth daily at 12 noon. 1 tablet 0  . LYRICA 75 MG capsule TAKE ONE CAPSULE BY MOUTH TWICE A DAY 60 capsule 0  . metoprolol succinate (TOPROL-XL) 25 MG 24 hr tablet Take 25 mg by mouth at bedtime.     . Multiple Vitamin (MULTIVITAMIN WITH MINERALS) TABS tablet Take 1 tablet by mouth daily.    . nicotine polacrilex (NICORETTE) 2 MG gum Take 2 mg by mouth every 4 (four) hours as needed for smoking cessation.    . rosuvastatin (CRESTOR) 40 MG tablet Take 1 tablet (40 mg total) by mouth daily. (Patient taking differently: Take 40 mg by mouth at bedtime. ) 30 tablet 12  . traMADol (ULTRAM) 50 MG tablet TAKE 1 TABLET TWICE A DAY AS NEEDED FOR MODERATE PAIN 60 tablet 1   No facility-administered medications prior to visit.       Past Surgical History:  Procedure Laterality Date  . dental implants    . ENDARTERECTOMY Left 01/12/2017   Procedure:  Left Carotid ENDARTERECTOMY;  Surgeon: Conrad Guthrie, MD;  Location: [facility];  Service: Vascular;  Laterality: Left;  . INGUINAL HERNIA REPAIR Left 08/25/2014   Procedure: LEFT INGUINAL HERNIA REPAIR REMOVAL SPERMATIC CORD MASS;  Surgeon: Jackolyn Confer, MD;  Location: [facility];  Service: General;  Laterality: Left;  . INSERTION OF MESH N/A 08/25/2014   Procedure: INSERTION OF MESH;  Surgeon: Jackolyn Confer, MD;  Location: [facility];  Service: General;  Laterality: N/A;  . PATCH ANGIOPLASTY Left 01/12/2017   Procedure: PATCH ANGIOPLASTY;  Surgeon: Conrad Flatwoods, MD;  Location: [facility];  Service: Vascular;  Laterality: Left;  . ROTATOR CUFF REPAIR Left       Past Medical History:  Diagnosis Date  .  Allergy   . Carotid artery occlusion   . Environmental and seasonal allergies   . GERD (gastroesophageal reflux disease)   . Heart palpitations   . History of Holter monitoring   . Hyperlipidemia   . Hypertension    does not take meds  . Nerve pain    neck; takes gabapentin  . Sleep apnea    wears CPAP nightly      Review of Systems  Constitutional: Negative for chills and fever.  Respiratory: Negative for chest tightness and shortness of breath.   Cardiovascular: Negative for chest pain.  Genitourinary: Positive for frequency and urgency. Negative for difficulty urinating, dysuria, flank pain and hematuria.      Objective:    BP 136/70 (BP Location: Left Arm, Patient Position: Sitting, Cuff Size: Large)   Pulse 74   Temp 98.5 F (36.9 C) (Oral)   Resp 16   Ht 5\' 10"  (1.778 m)   Wt 206 lb 1.8 oz (93.5 kg)   SpO2 96%   BMI 29.57 kg/m  Nursing note and vital signs reviewed.  Physical Exam  Constitutional: He is oriented to person, place, and time. He appears well-developed and well-nourished. No distress.  Cardiovascular: Normal rate, regular rhythm, normal heart sounds and intact distal pulses.   Pulmonary/Chest: Effort normal and breath sounds normal.  Abdominal: There is no CVA tenderness.  Genitourinary: Prostate is tender.  Neurological: He is alert and oriented to person, place, and time.  Skin: Skin is warm and dry.  Psychiatric: He has a normal mood and affect. His behavior is normal. Judgment and thought content normal.       Assessment & Plan:   Problem List Items Addressed This Visit      Other   Urinary frequency - Primary    In office urinalysis negative for leukocytes, nitrites, and hematuria. Physical exam concerning for chronic prostatitis versus benign prostate hypertrophy or overactive bladder. Start Flomax. Start ciprofloxacin. Encouraged to follow-up with urology if symptoms worsen or do not improve.      Relevant Medications   tamsulosin  (FLOMAX) 0.4 MG CAPS capsule   ciprofloxacin (CIPRO) 500 MG tablet   Other Relevant Orders   Urine culture   POCT urinalysis dipstick (Completed)       I am having Mr. Loewen start on tamsulosin and ciprofloxacin. I am also having him maintain his multivitamin with minerals, esomeprazole, loratadine, acetaminophen, metoprolol succinate, rosuvastatin, aspirin EC, ezetimibe, Vitamin D-3, Vitamin B-12, ibuprofen, nicotine polacrilex, losartan, gabapentin, traMADol, and LYRICA.   Meds ordered this encounter  Medications  . tamsulosin (FLOMAX) 0.4 MG CAPS capsule    Sig: Take 1 capsule (0.4 mg total) by mouth daily.    Dispense:  30 capsule    Refill:  3    Order Specific Question:   Supervising Provider    Answer:   Pricilla Holm A [[id number]]  . ciprofloxacin (CIPRO) 500 MG tablet    Sig: Take 1 tablet (500 mg total) by mouth 2 (two) times daily.    Dispense:  20 tablet    Refill:  0    Order Specific Question:   Supervising Provider    Answer:   Pricilla Holm A [[id number]]     Follow-up: Return if symptoms worsen or fail to improve.  Mauricio Po, FNP

## 2017-03-23 NOTE — Patient Instructions (Signed)
Thank you for choosing [facility].  SUMMARY AND INSTRUCTIONS:  Please start taking Flomax daily.  If no improvement please start the Ciprofloxacin.   Follow up if your symptoms worsen and urology if needed.   Medication:  Your prescription(s) have been submitted to your pharmacy or been printed and provided for you. Please take as directed and contact our office if you believe you are having problem(s) with the medication(s) or have any questions.   Follow up:  If your symptoms worsen or fail to improve, please contact our office for further instruction, or in case of emergency go directly to the emergency room at the closest medical facility.     Benign Prostatic Hyperplasia Benign prostatic hyperplasia is when the prostate gland is bigger than normal (enlarged). The prostate is a gland that produces the fluid that goes into semen. It is near the opening to the bladder and it surrounds the tube that drains urine out of the body (urethra). Benign prostatic hyperplasia is common among older men and it typically causes problems with urinating. The prostate grows slowly as you age. As the prostate grows, it can pinch the urethra. This causes the bladder to work too hard to pass urine, which leads to a thickened bladder wall. The bladder may eventually become weak and unable to empty completely. What are the causes? The exact cause of this condition is not known. It may be related to changes in hormones as the body ages. What increases the risk? You are more likely to develop this condition if:  You have a family history of the condition.  You are age 72 or older.  You have a history of erectile dysfunction.  You do not exercise.  You have certain medical conditions, including:  Type 2 diabetes.  Obesity.  Heart and circulatory disease. What are the signs or symptoms? Symptoms of this condition include:  Weak or interrupted urine stream.  Dribbling or leaking  urine.  Feeling like the bladder has not emptied completely.  Difficulty starting urination.  Getting up frequently at night to urinate.  Urinating more often (8 or more times a day).  Accidental loss of urine (urinary incontinence).  Pain during urination or ejaculation.  Urine with an unusual smell or color. The size of the prostate does not always determine the severity of the symptoms. For example, a man with a large prostate may experience minor symptoms, or a man with a smaller prostate may experience a severe blockage. How is this diagnosed? This condition may be diagnosed based on:  Your medical history and symptoms.  A physical exam. This usually includes a digital rectal exam. During this exam, your health care provider places a gloved, lubricated finger into the rectum to feel the size of the prostate.  A blood test. This test checks for high levels of a protein that is produced by the prostate (prostate specific antigen, PSA).  Tests to examine how well the urethra and bladder are functioning (urodynamic tests).  Cystoscopy. For this test, a small, tube-shaped instrument (cystoscope) is used to look inside the urethra and bladder. The cystoscope is placed into the urinary tract through the opening at the tip of the penis.  Urine tests.  Ultrasound. How is this treated? Treatment for this condition depends on how severe your symptoms are. Treatment may include:  Active surveillance or "watchful waiting." If your symptoms are mild, your health care provider may delay treatment and ask you to keep track of your symptoms. You will  have regular checkups to examine the size of your prostate, discuss symptoms, and determine whether treatment is needed.  Medicines. These may be used to:  Stop prostate growth.  Shrink the prostate.  Relieve symptoms.  Lifestyle changes, including:  Pelvic floor muscle exercises. The pelvic floor muscles are a group of muscles that  relax when you urinate.  Bladder training. This involves exercises that train the bladder to hold more urine for longer periods.  Reducing the amount of liquid that you drink. This is especially important before sleeping and before long periods of time spent in public.  Reducing the amount of caffeine and alcohol that you drink.  Treating or preventing constipation.  Surgery to reduce the size of the prostate or widen the urethra. This is typically done if your symptoms are severe or there are serious complications from the enlarged prostate. Follow these instructions at home: Medicines   Take over-the-counter and prescription medicines as told by your health care provider.  Avoid certain medicines, such as decongestants, antihistamines, and some prescription medicines as told by your health care provider. Ask your health care provider which medicines you should avoid. General instructions   Monitor your symptoms for any changes. Tell your health care provider about any changes.  Give yourself time when you urinate.  Avoid certain beverages that can irritate the bladder, such as:  Alcohol.  Caffeinated drinks like coffee, tea, and cola.  Avoid drinking large amounts of liquid before bed or before going out in public.  Do pelvic floor muscle or bladder training exercises as told by your health care provider.  Keep all follow-up visits as told by your health care provider. This is important. Contact a health care provider if:  Your develop new or worse symptoms.  You have trouble getting or maintaining an erection.  You have a fever.  You have pain or burning during urination.  You have blood in your urine. Get help right away if:  You have severe pain when urinating.  You cannot urinate.  You have severe pain in your abdomen.  You are dizzy.  You faint.  You have severe back pain.  Your urine is dark red and difficult to see through.  You have large blood  clots in your urine.  You have severe pain after an erection.  You have chest pain, dizziness, or nausea during sexual activity. Summary  The prostate is a gland that produces the fluid that goes into semen. It is near the opening to the bladder and it surrounds the tube that drains urine out of the body (urethra).  Benign prostatic hyperplasia is common among older men and it typically causes problems with urinating.  If your symptoms are mild, your health care provider may delay treatment and ask you to keep track of your symptoms. You will have regular checkups to examine the size of your prostate, discuss symptoms, and determine whether treatment is needed.  If directed, you may need to avoid certain medicines, such as decongestants, antihistamines, and some prescription medicines.  Contact your health care provider if you develop new or worse symptoms. This information is not intended to replace advice given to you by your health care provider. Make sure you discuss any questions you have with your health care provider. Document Released: 11/07/2005 Document Revised: 09/26/2016 Document Reviewed: 09/26/2016 Elsevier Interactive Patient Education  2017 [employer].

## 2017-03-25 LAB — URINE CULTURE: Organism ID, Bacteria: NO GROWTH

## 2017-03-26 ENCOUNTER — Encounter: Payer: Self-pay | Admitting: Family

## 2017-04-07 ENCOUNTER — Other Ambulatory Visit: Payer: Self-pay | Admitting: Family

## 2017-04-07 DIAGNOSIS — M5010 Cervical disc disorder with radiculopathy, unspecified cervical region: Secondary | ICD-10-CM

## 2017-04-07 NOTE — Telephone Encounter (Signed)
Faxed

## 2017-04-07 NOTE — Telephone Encounter (Signed)
Last refill was 02/2017.

## 2017-04-18 ENCOUNTER — Other Ambulatory Visit: Payer: Self-pay | Admitting: Cardiology

## 2017-04-21 NOTE — Addendum Note (Signed)
Addendum  created 04/21/17 1154 by Kimblery Diop, MD   Sign clinical note    

## 2017-04-24 ENCOUNTER — Other Ambulatory Visit: Payer: Self-pay | Admitting: Family

## 2017-04-25 ENCOUNTER — Other Ambulatory Visit: Payer: Self-pay | Admitting: Family

## 2017-04-25 NOTE — Telephone Encounter (Signed)
Faxed script back to [facility].../lmb 

## 2017-04-26 NOTE — Telephone Encounter (Signed)
Faxed

## 2017-05-23 ENCOUNTER — Other Ambulatory Visit: Payer: Self-pay

## 2017-05-23 DIAGNOSIS — R350 Frequency of micturition: Secondary | ICD-10-CM

## 2017-05-23 MED ORDER — TAMSULOSIN HCL 0.4 MG PO CAPS: 0.4000 mg | ORAL_CAPSULE | Freq: Every day | ORAL | 0 refills | Status: DC

## 2017-06-02 ENCOUNTER — Other Ambulatory Visit: Payer: Self-pay | Admitting: Family

## 2017-06-02 DIAGNOSIS — M5010 Cervical disc disorder with radiculopathy, unspecified cervical region: Secondary | ICD-10-CM

## 2017-06-28 ENCOUNTER — Other Ambulatory Visit: Payer: Self-pay | Admitting: Family

## 2017-06-28 DIAGNOSIS — M5010 Cervical disc disorder with radiculopathy, unspecified cervical region: Secondary | ICD-10-CM

## 2017-06-29 NOTE — Telephone Encounter (Signed)
Unable to to see when last refill was on database. Last refill was 04/07/17 with 2 refills.

## 2017-06-29 NOTE — Telephone Encounter (Signed)
Faxed

## 2017-07-17 ENCOUNTER — Other Ambulatory Visit: Payer: Self-pay | Admitting: Family

## 2017-07-17 DIAGNOSIS — R350 Frequency of micturition: Secondary | ICD-10-CM

## 2017-07-17 NOTE — Telephone Encounter (Signed)
Faxed

## 2017-07-17 NOTE — Telephone Encounter (Signed)
Can not pull up information on [facility]

## 2017-07-17 NOTE — Addendum Note (Signed)
Addended by: Delice Bison E on: 07/17/2017 02:03 PM   Modules accepted: Orders

## 2017-07-20 ENCOUNTER — Encounter: Payer: Self-pay | Admitting: Family

## 2017-07-20 ENCOUNTER — Ambulatory Visit (INDEPENDENT_AMBULATORY_CARE_PROVIDER_SITE_OTHER): Payer: BLUE CROSS/BLUE SHIELD | Admitting: Family

## 2017-07-20 VITALS — BP 152/86 | HR 82 | Temp 98.5°F | Resp 16 | Ht 70.0 in | Wt 215.0 lb

## 2017-07-20 DIAGNOSIS — M26621 Arthralgia of right temporomandibular joint: Secondary | ICD-10-CM | POA: Diagnosis not present

## 2017-07-20 DIAGNOSIS — H6981 Other specified disorders of Eustachian tube, right ear: Secondary | ICD-10-CM

## 2017-07-20 MED ORDER — PREDNISONE 10 MG (21) PO TBPK: ORAL_TABLET | ORAL | 0 refills | Status: DC

## 2017-07-20 NOTE — Assessment & Plan Note (Signed)
Symptoms appear consistent with TMJ inflammation with no catching or popping. Treat conservatively with anti-inflammatories include Duexis. Start prednisone taper. Recommend follow up with dentistry if symptoms worsen or do not improve.

## 2017-07-20 NOTE — Progress Notes (Signed)
Subjective:    Patient ID: Tommy May, male    DOB: [birth year], 56 y.o.   MRN: [id number]  Chief Complaint  Patient presents with  . Ear Pain    starting about a week ago started feeling shooting pain in right ear when eating, now has pain all the time and feels like fluid is in ear    HPI:  Tommy May is a 56 y.o. male who  has a past medical history of Allergy; Carotid artery occlusion; Environmental and seasonal allergies; GERD (gastroesophageal reflux disease); Heart palpitations; History of Holter monitoring; Hyperlipidemia; Hypertension; Nerve pain; and Sleep apnea. and presents today for an acute office visit.  This is a new problem. Associated symptom of pain located his right ear and noted while he was chewing and had shooting pains. Course of symptoms has worsened and now feels like pressure or fluid in his ear. No decreased hearing or popping sensation. No discharge or fevers. Modifying factors include ibuprofen which has helped a little.   Allergies  Allergen Reactions  . Atorvastatin Other (See Comments)    MYALGIAS      Outpatient Medications Prior to Visit  Medication Sig Dispense Refill  . acetaminophen (TYLENOL) 500 MG tablet Take 1,000 mg by mouth every 8 (eight) hours as needed for moderate pain.     Marland Kitchen aspirin EC 81 MG tablet Take 1 tablet (81 mg total) by mouth daily. (Patient taking differently: Take 81 mg by mouth at bedtime. ) 90 tablet 3  . Cholecalciferol (VITAMIN D-3) 5000 units TABS Take 5,000 Units by mouth daily.    . Cyanocobalamin (VITAMIN B-12) 2500 MCG SUBL Place 5,000 mcg under the tongue daily.    Marland Kitchen esomeprazole (NEXIUM) 40 MG capsule Take 40 mg by mouth daily with breakfast.     . ezetimibe (ZETIA) 10 MG tablet TAKE 1 TABLET EVERY DAY 30 tablet 11  . gabapentin (NEURONTIN) 300 MG capsule TAKE 1 CAPSULE (300 MG TOTAL) BY MOUTH AT BEDTIME. NEEDS OFFICE VISIT FOR MORE REFILLS 90 capsule 0  . ibuprofen (ADVIL,MOTRIN) 200 MG tablet Take 400  mg by mouth every 8 (eight) hours as needed (for pain.).    Marland Kitchen loratadine (CLARITIN) 10 MG tablet Take 10 mg by mouth daily.     Marland Kitchen LYRICA 75 MG capsule TAKE ONE CAPSULE BY MOUTH TWICE A DAY 60 capsule 2  . Multiple Vitamin (MULTIVITAMIN WITH MINERALS) TABS tablet Take 1 tablet by mouth daily.    . nicotine polacrilex (NICORETTE) 2 MG gum Take 2 mg by mouth every 4 (four) hours as needed for smoking cessation.    . rosuvastatin (CRESTOR) 40 MG tablet Take 1 tablet (40 mg total) by mouth daily. (Patient taking differently: Take 40 mg by mouth at bedtime. ) 30 tablet 12  . traMADol (ULTRAM) 50 MG tablet TAKE 1 TABLET BY MOUTH TWICE A DAY AS NEEDED FOR MODERATE PAIN 60 tablet 1  . losartan (COZAAR) 50 MG tablet Take 1 tablet (50 mg total) by mouth daily at 12 noon. 1 tablet 0  . ciprofloxacin (CIPRO) 500 MG tablet Take 1 tablet (500 mg total) by mouth 2 (two) times daily. 20 tablet 0  . metoprolol succinate (TOPROL-XL) 25 MG 24 hr tablet Take 25 mg by mouth at bedtime.     . tamsulosin (FLOMAX) 0.4 MG CAPS capsule Take 1 capsule (0.4 mg total) by mouth daily. 90 capsule 0   No facility-administered medications prior to visit.      Past  Medical History:  Diagnosis Date  . Allergy   . Carotid artery occlusion   . Environmental and seasonal allergies   . GERD (gastroesophageal reflux disease)   . Heart palpitations   . History of Holter monitoring   . Hyperlipidemia   . Hypertension    does not take meds  . Nerve pain    neck; takes gabapentin  . Sleep apnea    wears CPAP nightly      Review of Systems  Constitutional: Negative for chills and fever.  HENT: Positive for ear pain. Negative for congestion and ear discharge.   Respiratory: Negative for cough.   Neurological: Negative for headaches.      Objective:    BP (!) 152/86 (BP Location: Left Arm, Patient Position: Sitting, Cuff Size: Large)   Pulse 82   Temp 98.5 F (36.9 C) (Oral)   Resp 16   Ht 5\' 10"  (1.778 m)   Wt  215 lb (97.5 kg)   SpO2 95%   BMI 30.85 kg/m  Nursing note and vital signs reviewed.  Physical Exam  Constitutional: He is oriented to person, place, and time. He appears well-developed and well-nourished. No distress.  HENT:  Right Ear: Hearing, tympanic membrane, external ear and ear canal normal.  Left Ear: Hearing, tympanic membrane, external ear and ear canal normal.  Nose: Nose normal.  Mouth/Throat: Uvula is midline.  There is mild tenderness of the right TMJ with mild malalignment of teeth in the front.   Cardiovascular: Normal rate, regular rhythm, normal heart sounds and intact distal pulses.   Pulmonary/Chest: Effort normal and breath sounds normal.  Neurological: He is alert and oriented to person, place, and time.  Skin: Skin is warm and dry.  Psychiatric: He has a normal mood and affect. His behavior is normal. Judgment and thought content normal.       Assessment & Plan:   Problem List Items Addressed This Visit      Nervous and Auditory   ETD (eustachian tube dysfunction) - Primary    Symptoms and exam are consistent with eustachian tube dysfunction. Start prednisone taper to decrease inflammation and recommend OTC medications including nasal corticosteroid to help reduce symptoms.         Musculoskeletal and Integument   Arthralgia of right temporomandibular joint    Symptoms appear consistent with TMJ inflammation with no catching or popping. Treat conservatively with anti-inflammatories include Duexis. Start prednisone taper. Recommend follow up with dentistry if symptoms worsen or do not improve.           I have discontinued Mr. Mutschler metoprolol succinate, ciprofloxacin, and tamsulosin. I am also having him start on predniSONE. Additionally, I am having him maintain his multivitamin with minerals, esomeprazole, loratadine, acetaminophen, rosuvastatin, aspirin EC, Vitamin D-3, Vitamin B-12, ibuprofen, nicotine polacrilex, losartan, ezetimibe, gabapentin,  LYRICA, traMADol, and alfuzosin.   Meds ordered this encounter  Medications  . alfuzosin (UROXATRAL) 10 MG 24 hr tablet    Sig: Take 10 mg by mouth daily with breakfast.  . predniSONE (STERAPRED UNI-PAK 21 TAB) 10 MG (21) TBPK tablet    Sig: Take 6 tablets x 1 day, 5 tablets x 1 day, 4 tablets x 1 day, 3 tablets x 1 day, 2 tablets x 1 day, 1 tablet x 1 day    Dispense:  21 tablet    Refill:  0    Order Specific Question:   Supervising Provider    Answer:   Pricilla Holm A [[id number]]  Follow-up: Return if symptoms worsen or fail to improve.  Mauricio Po, FNP

## 2017-07-20 NOTE — Assessment & Plan Note (Signed)
Symptoms and exam are consistent with eustachian tube dysfunction. Start prednisone taper to decrease inflammation and recommend OTC medications including nasal corticosteroid to help reduce symptoms.

## 2017-07-20 NOTE — Patient Instructions (Addendum)
Thank you for choosing [facility].  SUMMARY AND INSTRUCTIONS:  Please continue to take your medication as prescribed.  Start the prednisone taper.   Consider taking Flonase, Nasacort, Rhinocort.   If your symptoms worsen please let us know.   Medication:  Your prescription(s) have been submitted to your pharmacy or been printed and provided for you. Please take as directed and contact our office if you believe you are having problem(s) with the medication(s) or have any questions.   Follow up:  If your symptoms worsen or fail to improve, please contact our office for further instruction, or in case of emergency go directly to the emergency room at the closest medical facility.

## 2017-08-10 ENCOUNTER — Other Ambulatory Visit: Payer: Self-pay | Admitting: Cardiology

## 2017-08-10 DIAGNOSIS — E785 Hyperlipidemia, unspecified: Secondary | ICD-10-CM

## 2017-08-24 ENCOUNTER — Other Ambulatory Visit: Payer: Self-pay | Admitting: Family

## 2017-08-24 DIAGNOSIS — M5010 Cervical disc disorder with radiculopathy, unspecified cervical region: Secondary | ICD-10-CM

## 2017-09-11 ENCOUNTER — Other Ambulatory Visit: Payer: Self-pay | Admitting: *Deleted

## 2017-09-13 ENCOUNTER — Other Ambulatory Visit: Payer: Self-pay | Admitting: Internal Medicine

## 2017-09-13 ENCOUNTER — Encounter: Payer: Self-pay | Admitting: Family

## 2017-09-14 MED ORDER — TRAMADOL HCL 50 MG PO TABS: 50.0000 mg | ORAL_TABLET | Freq: Two times a day (BID) | ORAL | 0 refills | Status: DC | PRN

## 2017-09-14 NOTE — Telephone Encounter (Signed)
Faxed to [facility] on [address]

## 2017-09-26 ENCOUNTER — Telehealth: Payer: Self-pay | Admitting: Family

## 2017-09-26 ENCOUNTER — Other Ambulatory Visit: Payer: Self-pay | Admitting: Internal Medicine

## 2017-09-26 DIAGNOSIS — M5010 Cervical disc disorder with radiculopathy, unspecified cervical region: Secondary | ICD-10-CM

## 2017-09-26 NOTE — Telephone Encounter (Signed)
pls advise on msg refills...Tommy May

## 2017-09-26 NOTE — Telephone Encounter (Signed)
Pt called requesting a refill on LYRICA 75 MG capsule to be sent to [facility] on [address]. He is scheduled to establish care with [facility] in January.

## 2017-09-27 MED ORDER — PREGABALIN 75 MG PO CAPS: 75.0000 mg | ORAL_CAPSULE | Freq: Two times a day (BID) | ORAL | 2 refills | Status: DC

## 2017-09-27 NOTE — Telephone Encounter (Signed)
3 month refill printed and faxed to [facility]

## 2017-10-31 NOTE — Progress Notes (Signed)
Established Carotid Patient   History of Present Illness   Tommy May is a 56 y.o. (June 24, 1961) male who presents with chief complaint: minimal numbness surrounding the neck scar.  Pt is s/p L CEA w/ BPA for L asx ICA stenosis >80% (01/12/17).    Previous carotid studies demonstrated: RICA <02% stenosis, LICA 54-27% stenosis.  Patient has no history of TIA or stroke symptom.  The patient has never had amaurosis fugax or monocular blindness.  The patient had one episode that was suggestive of central hemianopsia.  The patient has never had facial drooping or hemiplegia.  The patient has never had receptive or expressive aphasia.   The patient's PMH, PSH, SH, and FamHx are unchanged from 02/03/17.  Current Outpatient Medications  Medication Sig Dispense Refill  . acetaminophen (TYLENOL) 500 MG tablet Take 1,000 mg by mouth every 8 (eight) hours as needed for moderate pain.     Marland Kitchen alfuzosin (UROXATRAL) 10 MG 24 hr tablet Take 10 mg by mouth daily with breakfast.    . aspirin EC 81 MG tablet Take 1 tablet (81 mg total) by mouth daily. (Patient taking differently: Take 81 mg by mouth at bedtime. ) 90 tablet 3  . Cholecalciferol (VITAMIN D-3) 5000 units TABS Take 5,000 Units by mouth daily.    . Cyanocobalamin (VITAMIN B-12) 2500 MCG SUBL Place 5,000 mcg under the tongue daily.    Marland Kitchen esomeprazole (NEXIUM) 40 MG capsule Take 40 mg by mouth daily with breakfast.     . ezetimibe (ZETIA) 10 MG tablet TAKE 1 TABLET EVERY DAY 30 tablet 11  . gabapentin (NEURONTIN) 300 MG capsule TAKE ONE CAPSULE AT BEDTIME 90 capsule 0  . ibuprofen (ADVIL,MOTRIN) 200 MG tablet Take 400 mg by mouth every 8 (eight) hours as needed (for pain.).    Marland Kitchen loratadine (CLARITIN) 10 MG tablet Take 10 mg by mouth daily.     Marland Kitchen losartan (COZAAR) 50 MG tablet TAKE 1 TABLET (50 MG TOTAL) BY MOUTH DAILY. 90 tablet 3  . Multiple Vitamin (MULTIVITAMIN WITH MINERALS) TABS tablet Take 1 tablet by mouth daily.    . nicotine polacrilex  (NICORETTE) 2 MG gum Take 2 mg by mouth every 4 (four) hours as needed for smoking cessation.    . pregabalin (LYRICA) 75 MG capsule Take 1 capsule (75 mg total) 2 (two) times daily by mouth. 60 capsule 2  . rosuvastatin (CRESTOR) 40 MG tablet TAKE 1 TABLET BY MOUTH EVERY DAY 30 tablet 11  . traMADol (ULTRAM) 50 MG tablet Take 1 tablet (50 mg total) by mouth every 12 (twelve) hours as needed. Needs visit for refills 60 tablet 0  . losartan (COZAAR) 50 MG tablet Take 1 tablet (50 mg total) by mouth daily at 12 noon. 1 tablet 0   No current facility-administered medications for this visit.     On ROS today: no fever, chills, N/V/D, no CP SOB    Physical Examination   Vitals:   11/03/17 1529 11/03/17 1532  BP: 127/83 132/83  Pulse: 68   Resp: 16   Temp: 98 F (36.7 C)   TempSrc: Oral   SpO2: 95%   Weight: 216 lb (98 kg)   Height: 5\' 10"  (1.778 m)    Body mass index is 30.99 kg/m.  General Alert, O x 3, WD, NAD  Neck Supple, mid-line trachea, Neck incision healed  Pulmonary  cta, good B air movt, CTA B  Cardiac RRR, Nl S1, S2, no Murmurs, No rubs,  No S3,S4  Vascular Vessel Right Left  Radial Palpable Palpable  Brachial Palpable Palpable  Carotid Palpable, No Bruit Palpable, No Bruit  Aorta Not palpable N/A  Femoral Palpable Palpable  Popliteal   Not palpable  PT Palpable Palpable  DP Palpable Palpable    Gastro- intestinal soft, non-distended, non-tender to palpation, No guarding or rebound, no HSM, no masses, no CVAT B, No palpable prominent aortic pulse,    Musculo- skeletal M/S 5/5 throughout  , Extremities without ischemic changes    Neurologic Cranial nerves 2-12 intact , Pain and light touch intact in extremities , Motor exam as listed above    Non-Invasive Vascular Imaging   B Carotid Duplex (11/03/2017):   R ICA stenosis:  1-39%  R VA:  patent and antegrade  L ICA stenosis:  1-39%  L VA:  patent and antegrade   Medical Decision Making   Tommy May is a 56 y.o. male who presents with: asx R ICA stenosis <40%, s/p L CEA for asx L ICA stenosis >80%   Based on the patient's vascular studies and examination, I have offered the patient: 1 year f/u with our NP.  I discussed in depth with the patient the nature of atherosclerosis, and emphasized the importance of maximal medical management including strict control of blood pressure, blood glucose, and lipid levels, antiplatelet agents, obtaining regular exercise, and cessation of smoking.    The patient is aware that without maximal medical management the underlying atherosclerotic disease process will progress, limiting the benefit of any interventions. The patient is currently on a statin: Crestor.  The patient is currently on an anti-platelet: ASA.  Thank you for allowing [place] to participate in this patient's care.   Adele Barthel, MD, FACS [facility] Office: [phone] Pager: [phone]  Patient seen in conjunction with Dr. Bridgett Larsson  VASCULAR QUALITY INITIATIVE FOLLOW UP DATA:  Current smoker: [ x ] yes  [  ] no  Living status: [ x ]  Home  [  ] Nursing home  [  ] Homeless    MEDS:  ASA [  ] yes  [  ] no- [  ] medical reason  [ x ] non compliant reflux issues  STATIN  [s  ] yes  [  ] no- [  ] medical reason  [  ] non compliant  Beta blocker [x  ] yes  [  ] no- [  ] medical reason  [  ] non compliant  ACE inhibitor [  ] yes  [ x ] no- [  ] medical reason  [  ] non compliant  P2Y12 Antagonist [ x ] none  [  ] clopidogrel-Plavix  [  ] ticlopidine-Ticlid   [  ] prasugrel-Effient  [  ] ticagrelor- Brilinta    Anticoagulant [ x ] None  [  ] warfarin  [  ] rivaroxaban-Xarelto [  ] dabigatran- Pradaxa  Neurologic event since D/C:  [ x ] no  [  ] yes: [  ] eye event  [  ] cortical event  [  ] VB event  [  ] non specific event  [  ] right  [  ] left  [  ] TIA  [  ] stroke  Date:   Modified Rankin Score: 0  MI since D/C: [ x ] no  [  ] troponin  only  [  ] EKG or clinical  Cranial nerve injury: [x  ]  none  [  ] resolved  [  ] persistent  Duplex CEA site: [  ] no  [ x ] yes - PSV= 116  EDV= 35  ICA/CCA ratio:  0.78 Stenosis= [ x ] <40% [  ] 40-59% [  ] 60-79%  [  ] > 80%  [  ]  Occluded  CEA site re-operation:  [ x ] no   [  ] yes- date of re-op:  CEA site PCI:   [ x ] no   [  ] yes- date of PCI:

## 2017-11-03 ENCOUNTER — Ambulatory Visit (HOSPITAL_COMMUNITY)
Admission: RE | Admit: 2017-11-03 | Discharge: 2017-11-03 | Disposition: A | Discharge status: Home / Self Care | Payer: BLUE CROSS/BLUE SHIELD | Source: Ambulatory Visit | Attending: Vascular Surgery | Admitting: Vascular Surgery

## 2017-11-03 ENCOUNTER — Other Ambulatory Visit: Payer: Self-pay | Admitting: Cardiology

## 2017-11-03 ENCOUNTER — Ambulatory Visit (INDEPENDENT_AMBULATORY_CARE_PROVIDER_SITE_OTHER): Payer: BLUE CROSS/BLUE SHIELD | Admitting: Vascular Surgery

## 2017-11-03 ENCOUNTER — Encounter: Payer: Self-pay | Admitting: Vascular Surgery

## 2017-11-03 VITALS — BP 132/83 | HR 68 | Temp 98.0°F | Resp 16 | Ht 70.0 in | Wt 216.0 lb

## 2017-11-03 DIAGNOSIS — I6521 Occlusion and stenosis of right carotid artery: Secondary | ICD-10-CM | POA: Diagnosis not present

## 2017-11-03 DIAGNOSIS — Z9889 Other specified postprocedural states: Secondary | ICD-10-CM | POA: Diagnosis not present

## 2017-11-03 DIAGNOSIS — I739 Peripheral vascular disease, unspecified: Secondary | ICD-10-CM

## 2017-11-03 DIAGNOSIS — I6523 Occlusion and stenosis of bilateral carotid arteries: Secondary | ICD-10-CM | POA: Diagnosis not present

## 2017-11-03 DIAGNOSIS — I779 Disorder of arteries and arterioles, unspecified: Secondary | ICD-10-CM | POA: Diagnosis not present

## 2017-11-03 LAB — VAS US CAROTID
LEFT ECA DIAS: -24 cm/s
LEFT VERTEBRAL DIAS: -12 cm/s
Left CCA dist dias: -20 cm/s
Left CCA dist sys: -132 cm/s
Left CCA prox dias: 26 cm/s
Left CCA prox sys: 126 cm/s
Left ICA dist dias: -26 cm/s
Left ICA dist sys: -86 cm/s
Left ICA prox dias: -31 cm/s
Left ICA prox sys: -113 cm/s
RIGHT CCA MID DIAS: -24 cm/s
RIGHT ECA DIAS: -22 cm/s
RIGHT VERTEBRAL DIAS: -8 cm/s
Right CCA prox dias: 23 cm/s
Right CCA prox sys: 145 cm/s
Right cca dist sys: -83 cm/s

## 2017-11-07 ENCOUNTER — Other Ambulatory Visit: Payer: Self-pay | Admitting: Family

## 2017-11-23 ENCOUNTER — Telehealth: Payer: Self-pay | Admitting: Nurse Practitioner

## 2017-11-23 ENCOUNTER — Encounter: Payer: Self-pay | Admitting: Nurse Practitioner

## 2017-11-23 ENCOUNTER — Ambulatory Visit: Payer: Managed Care, Other (non HMO) | Admitting: Nurse Practitioner

## 2017-11-23 DIAGNOSIS — E785 Hyperlipidemia, unspecified: Secondary | ICD-10-CM | POA: Insufficient documentation

## 2017-11-23 DIAGNOSIS — I10 Essential (primary) hypertension: Secondary | ICD-10-CM | POA: Diagnosis not present

## 2017-11-23 DIAGNOSIS — M5010 Cervical disc disorder with radiculopathy, unspecified cervical region: Secondary | ICD-10-CM | POA: Diagnosis not present

## 2017-11-23 MED ORDER — TRAMADOL HCL 50 MG PO TABS: 50.0000 mg | ORAL_TABLET | Freq: Two times a day (BID) | ORAL | 0 refills | Status: DC | PRN

## 2017-11-23 MED ORDER — GABAPENTIN 300 MG PO CAPS: 300.0000 mg | ORAL_CAPSULE | Freq: Every day | ORAL | 2 refills | Status: DC

## 2017-11-23 NOTE — Telephone Encounter (Signed)
His blood pressure was quite elevated on todays visit. Is he still taking losartan 50 daily? Is he taking anything else for his blood pressure that we should know about? Can we please having him record readings- just once a day or a few times a week-at home for the next two weeks and call back to the clinic with these readings? I want to make sure that his BP is not running high all the time. Thanks!

## 2017-11-23 NOTE — Progress Notes (Signed)
Subjective:    Patient ID: Tommy May, male    DOB: Dec 13, 1960, 57 y.o.   MRN: [id number]  HPI Tommy May is a 57 yo male who presents today to establish care. He is transferring to me from another provider in the same clinic. He presents today requesting a refill of medications: Gabapentin, tramadol for his chronic neck pain. He also has a history of carotid artery disease, GERD, hypertension, hyperlipidemia, OSA, TMJ, inguinal hernia. He follows with a urology provider for prostate enlargement. He follows with cardiology annually for management of his hypertension, hyperlipidemia, and CAD.  Chronic neck pain- This has been an ongoing problem for many years now. He experiences the pain daily. The pain has not worsened recently. The pain is in his posterior neck radiating into his left shoulder with intermittent numbness and tingling in his left fingers. Denies any known injury but has always worked jobs with heavy lifting and long periods of sitting and driving. He had an MRI of his neck in 1999 which showed mild disc bulging,  degenerative joint changes, foraminal narrowing. He saw neurosurgery in the past for his neck pain but did not continue follow up. He reports decreased range of motion. He denies weakness, chest pain, shortness of breath, discoloration of skin, swelling, He prefers to avoid NSAIDs due to increased GERD symptoms when taking. He feels his pain is well controlled on tramadol 50 BID and gabapentin 300 at bedtime. He was also prescribed lyrica for this but stopped taking due to affordability.  Hypertension- maintained on losartan. He reports that his wife checks his blood pressure regularly at home without elevated readings. He does not report any home readings today.  BP Readings from Last 3 Encounters:  11/23/17 (!) 162/88  11/03/17 132/83  07/20/17 (!) 152/86    Review of Systems  See HPI  Past Medical History:  Diagnosis Date  . Allergy   . Carotid  artery occlusion   . Environmental and seasonal allergies   . GERD (gastroesophageal reflux disease)   . Heart palpitations   . History of Holter monitoring   . Hyperlipidemia   . Hypertension    does not take meds  . Nerve pain    neck; takes gabapentin  . Sleep apnea    wears CPAP nightly     Social History   Socioeconomic History  . Marital status: Married    Spouse name: Not on file  . Number of children: 2  . Years of education: 58  . Highest education level: Not on file  Social Needs  . Financial resource strain: Not on file  . Food insecurity - worry: Not on file  . Food insecurity - inability: Not on file  . Transportation needs - medical: Not on file  . Transportation needs - non-medical: Not on file  Occupational History  . Occupation: Geologist, engineering  Tobacco Use  . Smoking status: Current Every Day Smoker    Packs/day: 0.10    Years: 43.00    Pack years: 4.30    Types: Cigarettes  . Smokeless tobacco: Never Used  . Tobacco comment: Down to 1/2 pk per day  Substance and Sexual Activity  . Alcohol use: No  . Drug use: No  . Sexual activity: Not on file  Other Topics Concern  . Not on file  Social History Narrative   Pt lives with his wife and two children.  Graduated high school.    Past Surgical History:  Procedure Laterality Date  .  dental implants    . ENDARTERECTOMY Left 01/12/2017   Procedure: Left Carotid ENDARTERECTOMY;  Surgeon: Conrad [place], MD;  Location: [facility];  Service: Vascular;  Laterality: Left;  . INGUINAL HERNIA REPAIR Left 08/25/2014   Procedure: LEFT INGUINAL HERNIA REPAIR REMOVAL SPERMATIC CORD MASS;  Surgeon: Jackolyn Confer, MD;  Location: [facility];  Service: General;  Laterality: Left;  . INSERTION OF MESH N/A 08/25/2014   Procedure: INSERTION OF MESH;  Surgeon: Jackolyn Confer, MD;  Location: [facility];  Service: General;  Laterality: N/A;  . PATCH ANGIOPLASTY Left 01/12/2017   Procedure: PATCH ANGIOPLASTY;  Surgeon: Conrad [place], MD;   Location: [facility];  Service: Vascular;  Laterality: Left;  . ROTATOR CUFF REPAIR Left     Family History  Problem Relation Age of Onset  . Stroke Father 47  . Diabetes Father   . Diabetes Paternal Grandmother   . Stroke Paternal Grandfather 19       guess early 72's  . Heart attack Maternal Grandfather     Allergies  Allergen Reactions  . Atorvastatin Other (See Comments)    MYALGIAS    Current Outpatient Medications on File Prior to Visit  Medication Sig Dispense Refill  . acetaminophen (TYLENOL) 500 MG tablet Take 1,000 mg by mouth every 8 (eight) hours as needed for moderate pain.     Marland Kitchen alfuzosin (UROXATRAL) 10 MG 24 hr tablet Take 10 mg by mouth daily with breakfast.    . aspirin EC 81 MG tablet Take 1 tablet (81 mg total) by mouth daily. (Patient taking differently: Take 81 mg by mouth at bedtime. ) 90 tablet 3  . Cholecalciferol (VITAMIN D-3) 5000 units TABS Take 5,000 Units by mouth daily.    . Cyanocobalamin (VITAMIN B-12) 2500 MCG SUBL Place 5,000 mcg under the tongue daily.    Marland Kitchen esomeprazole (NEXIUM) 40 MG capsule Take 40 mg by mouth daily with breakfast.     . ezetimibe (ZETIA) 10 MG tablet TAKE 1 TABLET EVERY DAY 30 tablet 11  . ibuprofen (ADVIL,MOTRIN) 200 MG tablet Take 400 mg by mouth every 8 (eight) hours as needed (for pain.).    Marland Kitchen loratadine (CLARITIN) 10 MG tablet Take 10 mg by mouth daily.     Marland Kitchen losartan (COZAAR) 50 MG tablet TAKE 1 TABLET (50 MG TOTAL) BY MOUTH DAILY. 90 tablet 3  . Multiple Vitamin (MULTIVITAMIN WITH MINERALS) TABS tablet Take 1 tablet by mouth daily.    . nicotine polacrilex (NICORETTE) 2 MG gum Take 2 mg by mouth every 4 (four) hours as needed for smoking cessation.    . rosuvastatin (CRESTOR) 40 MG tablet TAKE 1 TABLET BY MOUTH EVERY DAY 30 tablet 11  . losartan (COZAAR) 50 MG tablet Take 1 tablet (50 mg total) by mouth daily at 12 noon. 1 tablet 0   No current facility-administered medications on file prior to visit.     BP (!) 162/88  (BP Location: Left Arm, Patient Position: Sitting, Cuff Size: Large)   Pulse 79   Temp 98.9 F (37.2 C) (Oral)   Resp 16   Ht 5\' 10"  (1.778 m)   Wt 216 lb (98 kg)   SpO2 97%   BMI 30.99 kg/m       Objective:   Physical Exam  Constitutional: He is oriented to person, place, and time. He appears well-developed and well-nourished. No distress.  HENT:  Head: Normocephalic and atraumatic.  Cardiovascular: Normal rate, regular rhythm, normal heart sounds and intact distal  pulses.  Pulmonary/Chest: Effort normal and breath sounds normal.  Musculoskeletal:       Cervical back: He exhibits decreased range of motion. He exhibits no tenderness, no bony tenderness, no swelling, no edema and no deformity.  Decreased left lateral rotation.  Neurological: He is alert and oriented to person, place, and time. He has normal reflexes. Coordination normal.  Skin: Skin is warm and dry.  Psychiatric: He has a normal mood and affect. Judgment and thought content normal.      Assessment & Plan:  RTC for CPE, routine lab work.

## 2017-11-23 NOTE — Assessment & Plan Note (Signed)
Will continue medications at current dosages. He does not want to order further testing or treatment including imaging or PT today due to cost. We discussed that it would be a good idea to follow up for updated imaging or other treatments due to the numbness and tingling in his left fingers possibly indicating need for intervention to prevent permanent nerve or muscle damage, and he verbalized understanding. Recommended follow up with sports medicine providers in the office and he will let me know if he is interested in PT referral at a later date. DISCONTINUED lyrica, as this should not be prescribed in combination with gabapentin. Medications ordered: - gabapentin (NEURONTIN) 300 MG capsule; Take 1 capsule (300 mg total) by mouth at bedtime.  Dispense: 90 capsule; Refill: 2 - traMADol (ULTRAM) 50 MG tablet; Take 1 tablet (50 mg total) by mouth every 12 (twelve) hours as needed. Needs visit for refills  Dispense: 60 tablet; Refill: 0 Controlled substance registry reviewed with no irregularities.

## 2017-11-23 NOTE — Assessment & Plan Note (Signed)
Maintained on losartan 50 daily Elevated reading today, but denies at home. I will request him to call back in 2 weeks with home readings.

## 2017-11-23 NOTE — Patient Instructions (Addendum)
I have sent refills of your gabapentin and tramadol to your pharmacy. I discontinued the lyrica, as we should not combine lyrica and gabapentin.  I would recommend a follow up with one of our sports medicine providers here in the office, Dr Tamala Julian or Dr Raeford Razor, to check in on your neck pain and see if there are other possible treatments.  Physical therapy would also be helpful, please let me know if you are ever interested.   Return at your convenience for an annual physical.  It was nice to meet you. Thanks for letting me take care of you today :)

## 2017-11-24 NOTE — Telephone Encounter (Signed)
Let patient know response below. Agreed to keep record of BP for a couple of weeks and call back with readings. States that he does take his losartan 50 mg daily as prescribed.

## 2017-12-21 ENCOUNTER — Other Ambulatory Visit: Payer: Self-pay | Admitting: Nurse Practitioner

## 2017-12-21 DIAGNOSIS — M5010 Cervical disc disorder with radiculopathy, unspecified cervical region: Secondary | ICD-10-CM

## 2017-12-21 NOTE — Telephone Encounter (Signed)
Last refill was on 11/23/17 per database

## 2018-01-19 ENCOUNTER — Other Ambulatory Visit: Payer: Self-pay | Admitting: Nurse Practitioner

## 2018-01-19 DIAGNOSIS — M5010 Cervical disc disorder with radiculopathy, unspecified cervical region: Secondary | ICD-10-CM

## 2018-01-19 NOTE — Telephone Encounter (Signed)
Last refill was 12/24/2017 per database.

## 2018-01-20 MED ORDER — TRAMADOL HCL 50 MG PO TABS: 50.0000 mg | ORAL_TABLET | Freq: Two times a day (BID) | ORAL | 0 refills | Status: DC

## 2018-02-06 ENCOUNTER — Telehealth: Payer: Self-pay | Admitting: Family Medicine

## 2018-02-06 ENCOUNTER — Other Ambulatory Visit (INDEPENDENT_AMBULATORY_CARE_PROVIDER_SITE_OTHER): Payer: Managed Care, Other (non HMO)

## 2018-02-06 ENCOUNTER — Ambulatory Visit (INDEPENDENT_AMBULATORY_CARE_PROVIDER_SITE_OTHER)
Admission: RE | Admit: 2018-02-06 | Discharge: 2018-02-06 | Disposition: A | Discharge status: Home / Self Care | Payer: Managed Care, Other (non HMO) | Source: Ambulatory Visit | Attending: Family Medicine | Admitting: Family Medicine

## 2018-02-06 ENCOUNTER — Encounter: Payer: Self-pay | Admitting: Family Medicine

## 2018-02-06 ENCOUNTER — Ambulatory Visit: Payer: Managed Care, Other (non HMO) | Admitting: Family Medicine

## 2018-02-06 VITALS — BP 142/88 | HR 88 | Temp 98.3°F | Ht 70.0 in | Wt 210.0 lb

## 2018-02-06 DIAGNOSIS — R61 Generalized hyperhidrosis: Secondary | ICD-10-CM | POA: Diagnosis not present

## 2018-02-06 DIAGNOSIS — R350 Frequency of micturition: Secondary | ICD-10-CM

## 2018-02-06 DIAGNOSIS — R05 Cough: Secondary | ICD-10-CM

## 2018-02-06 DIAGNOSIS — R059 Cough, unspecified: Secondary | ICD-10-CM

## 2018-02-06 LAB — CBC WITH DIFFERENTIAL/PLATELET
Basophils Absolute: 0 10*3/uL (ref 0.0–0.1)
Basophils Relative: 0.2 % (ref 0.0–3.0)
Eosinophils Absolute: 0.1 10*3/uL (ref 0.0–0.7)
Eosinophils Relative: 0.8 % (ref 0.0–5.0)
HCT: 48.8 % (ref 39.0–52.0)
Hemoglobin: 17.1 g/dL — ABNORMAL HIGH (ref 13.0–17.0)
Lymphocytes Relative: 25.5 % (ref 12.0–46.0)
Lymphs Abs: 2.1 10*3/uL (ref 0.7–4.0)
MCHC: 35.1 g/dL (ref 30.0–36.0)
MCV: 87.5 fl (ref 78.0–100.0)
Monocytes Absolute: 0.7 10*3/uL (ref 0.1–1.0)
Monocytes Relative: 8.1 % (ref 3.0–12.0)
Neutro Abs: 5.5 10*3/uL (ref 1.4–7.7)
Neutrophils Relative %: 65.4 % (ref 43.0–77.0)
Platelets: 192 10*3/uL (ref 150.0–400.0)
RBC: 5.58 Mil/uL (ref 4.22–5.81)
RDW: 12.6 % (ref 11.5–15.5)
WBC: 8.4 10*3/uL (ref 4.0–10.5)

## 2018-02-06 LAB — URINALYSIS
Bilirubin Urine: NEGATIVE
Hgb urine dipstick: NEGATIVE
Ketones, ur: NEGATIVE
Leukocytes, UA: NEGATIVE
Nitrite: NEGATIVE
Specific Gravity, Urine: 1.015 (ref 1.000–1.030)
Total Protein, Urine: NEGATIVE
Urine Glucose: NEGATIVE
Urobilinogen, UA: 0.2 (ref 0.0–1.0)
pH: 7 (ref 5.0–8.0)

## 2018-02-06 MED ORDER — AZITHROMYCIN 250 MG PO TABS: ORAL_TABLET | ORAL | 0 refills | Status: DC

## 2018-02-06 NOTE — Assessment & Plan Note (Signed)
Likely post viral in nature. Having it ongoing for three weeks.  - chest xray  - azithromycin

## 2018-02-06 NOTE — Telephone Encounter (Signed)
Spoke with patient about his lab results and xray. If he doesn't have improvement in his cough, may need to check BNP or ECHO. Could consider low dose CT scan since he has been a smoker for at least 25 years.   Rosemarie Ax, MD [facility] 02/06/2018, 12:39 PM

## 2018-02-06 NOTE — Progress Notes (Signed)
Tommy May - 57 y.o. male MRN [id number]  Date of birth: [birth year]  SUBJECTIVE:  Including CC & ROS.  Chief Complaint  Patient presents with  . Cough    Tommy May is a 57 y.o. male that is presenting with a cough. Ongoing for three weeks. Admits to body aches, chills. Denies productive cough. He has not been around anyone with similar symptoms. He does not get the flu shot. He has been taking Flonase and over the counter cold medications. Admits to night sweats. Admits to shortness of breath. He admits having night sweats. He has this for the past three weeks. Denies any blood production with the cough. He has urine increased frequency. His urine has also smelled different recently. He denies any dysuria. No discharge with urination.     Review of Systems  Constitutional: Positive for activity change and chills. Negative for fever.  HENT: Positive for congestion.   Respiratory: Positive for cough.   Cardiovascular: Negative for chest pain.  Gastrointestinal: Negative for abdominal pain.  Musculoskeletal: Negative for joint swelling.  Skin: Negative for color change.  Allergic/Immunologic: Negative for immunocompromised state.  Neurological: Negative for weakness.  Hematological: Negative for adenopathy.  Psychiatric/Behavioral: Negative for agitation.    HISTORY: Past Medical, Surgical, Social, and Family History Reviewed & Updated per EMR.   Pertinent Historical Findings include:  Past Medical History:  Diagnosis Date  . Allergy   . Carotid artery occlusion   . Environmental and seasonal allergies   . GERD (gastroesophageal reflux disease)   . Heart palpitations   . History of Holter monitoring   . Hyperlipidemia   . Hypertension    does not take meds  . Nerve pain    neck; takes gabapentin  . Sleep apnea    wears CPAP nightly    Past Surgical History:  Procedure Laterality Date  . dental implants    . ENDARTERECTOMY Left 01/12/2017   Procedure: Left  Carotid ENDARTERECTOMY;  Surgeon: Conrad Storey, MD;  Location: [place];  Service: Vascular;  Laterality: Left;  . INGUINAL HERNIA REPAIR Left 08/25/2014   Procedure: LEFT INGUINAL HERNIA REPAIR REMOVAL SPERMATIC CORD MASS;  Surgeon: Jackolyn Confer, MD;  Location: [place];  Service: General;  Laterality: Left;  . INSERTION OF MESH N/A 08/25/2014   Procedure: INSERTION OF MESH;  Surgeon: Jackolyn Confer, MD;  Location: [place];  Service: General;  Laterality: N/A;  . PATCH ANGIOPLASTY Left 01/12/2017   Procedure: PATCH ANGIOPLASTY;  Surgeon: Conrad Fairmount, MD;  Location: [place];  Service: Vascular;  Laterality: Left;  . ROTATOR CUFF REPAIR Left     Allergies  Allergen Reactions  . Atorvastatin Other (See Comments)    MYALGIAS    Family History  Problem Relation Age of Onset  . Stroke Father 44  . Diabetes Father   . Diabetes Paternal Grandmother   . Stroke Paternal Grandfather 42       guess early 77's  . Heart attack Maternal Grandfather      Social History   Socioeconomic History  . Marital status: Married    Spouse name: Not on file  . Number of children: 2  . Years of education: 37  . Highest education level: Not on file  Social Needs  . Financial resource strain: Not on file  . Food insecurity - worry: Not on file  . Food insecurity - inability: Not on file  . Transportation needs - medical: Not on file  . Transportation needs -  non-medical: Not on file  Occupational History  . Occupation: Geologist, engineering  Tobacco Use  . Smoking status: Current Every Day Smoker    Packs/day: 0.10    Years: 43.00    Pack years: 4.30    Types: Cigarettes  . Smokeless tobacco: Never Used  . Tobacco comment: Down to 1/2 pk per day  Substance and Sexual Activity  . Alcohol use: No  . Drug use: No  . Sexual activity: Not on file  Other Topics Concern  . Not on file  Social History Narrative   Pt lives with his wife and two children.  Graduated high school.     PHYSICAL EXAM:  VS: BP (!)  142/88 (BP Location: Left Arm, Patient Position: Sitting, Cuff Size: Normal)   Pulse 88   Temp 98.3 F (36.8 C) (Oral)   Ht 5\' 10"  (1.778 m)   Wt 210 lb (95.3 kg)   SpO2 98%   BMI 30.13 kg/m  Physical Exam Gen: NAD, alert, cooperative with exam,  ENT: normal lips, normal nasal mucosa, tympanic membranes clear and intact bilaterally, normal oropharynx, no cervical lymphadenopathy Eye: normal EOM, normal conjunctiva and lids CV:  no edema, +2 pedal pulses, regular rate and rhythm, S1-S2   Resp: no accessory muscle use, non-labored, clear to auscultation bilaterally, no crackles or wheezes Skin: no rashes, no areas of induration  Neuro: normal tone, normal sensation to touch Psych:  normal insight, alert and oriented MSK: Normal gait, normal strength       ASSESSMENT & PLAN:   Urinary frequency Reports this has been an ongoing issue but now his urine smells. He denies any pain.  - UA  - has a urologist that he can see if needed  Cough Likely post viral in nature. Having it ongoing for three weeks.  - chest xray  - azithromycin   Night sweats Unclear as to the source of his night sweats. Doesn't report any adenopathy  - cbc with smear  - cbc w/ diff - if continues could consider CT scan, smoker for 25 years, TSH, blood cultures.

## 2018-02-06 NOTE — Assessment & Plan Note (Signed)
Reports this has been an ongoing issue but now his urine smells. He denies any pain.  - UA  - has a urologist that he can see if needed

## 2018-02-06 NOTE — Patient Instructions (Signed)
We will call you with the results from today   Please try things such as zyrtec-D or allegra-D which is an antihistamine and decongestant.   Please try afrin which will help with nasal congestion but use for only three days.   Please also try using a netti pot on a regular occasion.  Honey can help with a sore throat.

## 2018-02-06 NOTE — Assessment & Plan Note (Signed)
Unclear as to the source of his night sweats. Doesn't report any adenopathy  - cbc with smear  - cbc w/ diff - if continues could consider CT scan, smoker for 25 years, TSH, blood cultures.

## 2018-02-07 LAB — PATHOLOGIST SMEAR REVIEW

## 2018-02-16 ENCOUNTER — Other Ambulatory Visit: Payer: Self-pay | Admitting: Nurse Practitioner

## 2018-02-16 DIAGNOSIS — M5010 Cervical disc disorder with radiculopathy, unspecified cervical region: Secondary | ICD-10-CM

## 2018-02-16 MED ORDER — TRAMADOL HCL 50 MG PO TABS: 50.0000 mg | ORAL_TABLET | Freq: Two times a day (BID) | ORAL | 0 refills | Status: DC

## 2018-02-16 NOTE — Telephone Encounter (Signed)
Last refill was 01/20/18 per database.

## 2018-02-19 ENCOUNTER — Other Ambulatory Visit: Payer: Self-pay | Admitting: Cardiology

## 2018-03-15 ENCOUNTER — Other Ambulatory Visit: Payer: Self-pay | Admitting: Nurse Practitioner

## 2018-03-15 DIAGNOSIS — M5010 Cervical disc disorder with radiculopathy, unspecified cervical region: Secondary | ICD-10-CM

## 2018-03-15 MED ORDER — TRAMADOL HCL 50 MG PO TABS
50.0000 mg | ORAL_TABLET | Freq: Two times a day (BID) | ORAL | 0 refills | Status: DC
Start: 2018-03-15 — End: 2018-04-11

## 2018-03-15 NOTE — Telephone Encounter (Signed)
Check [place] registry last filled 02/16/2018.Marland KitchenJohny Chess

## 2018-03-26 ENCOUNTER — Encounter: Payer: Managed Care, Other (non HMO) | Admitting: Nurse Practitioner

## 2018-04-05 ENCOUNTER — Other Ambulatory Visit: Payer: Self-pay

## 2018-04-05 MED ORDER — EZETIMIBE 10 MG PO TABS: 10.0000 mg | ORAL_TABLET | Freq: Every day | ORAL | 0 refills | Status: DC

## 2018-04-09 ENCOUNTER — Other Ambulatory Visit (INDEPENDENT_AMBULATORY_CARE_PROVIDER_SITE_OTHER): Payer: Managed Care, Other (non HMO)

## 2018-04-09 ENCOUNTER — Ambulatory Visit (INDEPENDENT_AMBULATORY_CARE_PROVIDER_SITE_OTHER): Payer: Managed Care, Other (non HMO) | Admitting: Nurse Practitioner

## 2018-04-09 ENCOUNTER — Encounter: Payer: Self-pay | Admitting: Nurse Practitioner

## 2018-04-09 VITALS — BP 140/84 | HR 83 | Temp 98.1°F | Resp 16 | Ht 70.0 in | Wt 209.0 lb

## 2018-04-09 DIAGNOSIS — F419 Anxiety disorder, unspecified: Secondary | ICD-10-CM

## 2018-04-09 DIAGNOSIS — Z0000 Encounter for general adult medical examination without abnormal findings: Secondary | ICD-10-CM | POA: Diagnosis not present

## 2018-04-09 DIAGNOSIS — Z9189 Other specified personal risk factors, not elsewhere classified: Secondary | ICD-10-CM

## 2018-04-09 DIAGNOSIS — Z1159 Encounter for screening for other viral diseases: Secondary | ICD-10-CM

## 2018-04-09 DIAGNOSIS — R7309 Other abnormal glucose: Secondary | ICD-10-CM

## 2018-04-09 DIAGNOSIS — L989 Disorder of the skin and subcutaneous tissue, unspecified: Secondary | ICD-10-CM | POA: Diagnosis not present

## 2018-04-09 DIAGNOSIS — E785 Hyperlipidemia, unspecified: Secondary | ICD-10-CM

## 2018-04-09 DIAGNOSIS — I10 Essential (primary) hypertension: Secondary | ICD-10-CM | POA: Diagnosis not present

## 2018-04-09 DIAGNOSIS — Z0001 Encounter for general adult medical examination with abnormal findings: Secondary | ICD-10-CM

## 2018-04-09 LAB — COMPREHENSIVE METABOLIC PANEL
ALT: 30 U/L (ref 0–53)
AST: 19 U/L (ref 0–37)
Albumin: 3.5 g/dL (ref 3.5–5.2)
Alkaline Phosphatase: 55 U/L (ref 39–117)
BUN: 12 mg/dL (ref 6–23)
CO2: 26 mEq/L (ref 19–32)
Calcium: 8.8 mg/dL (ref 8.4–10.5)
Chloride: 106 mEq/L (ref 96–112)
Creatinine, Ser: 0.8 mg/dL (ref 0.40–1.50)
GFR: 105.13 mL/min (ref >60.00)
Glucose, Bld: 170 mg/dL — ABNORMAL HIGH (ref 70–99)
Potassium: 4.1 mEq/L (ref 3.5–5.1)
Sodium: 139 mEq/L (ref 135–145)
Total Bilirubin: 0.5 mg/dL (ref 0.2–1.2)
Total Protein: 6.2 g/dL (ref 6.0–8.3)

## 2018-04-09 LAB — CBC
HCT: 48.7 % (ref 39.0–52.0)
Hemoglobin: 16.7 g/dL (ref 13.0–17.0)
MCHC: 34.3 g/dL (ref 30.0–36.0)
MCV: 89.3 fl (ref 78.0–100.0)
Platelets: 175 10*3/uL (ref 150.0–400.0)
RBC: 5.45 Mil/uL (ref 4.22–5.81)
RDW: 12.5 % (ref 11.5–15.5)
WBC: 7.8 10*3/uL (ref 4.0–10.5)

## 2018-04-09 LAB — HEMOGLOBIN A1C: Hgb A1c MFr Bld: 6.5 % (ref 4.6–6.5)

## 2018-04-09 LAB — LIPID PANEL
Cholesterol: 158 mg/dL (ref 0–200)
HDL: 40.5 mg/dL (ref >39.00)
NonHDL: 117.04
Total CHOL/HDL Ratio: 4
Triglycerides: 316 mg/dL — ABNORMAL HIGH (ref 0.0–149.0)
VLDL: 63.2 mg/dL — ABNORMAL HIGH (ref 0.0–40.0)

## 2018-04-09 MED ORDER — ESCITALOPRAM OXALATE 10 MG PO TABS: 10.0000 mg | ORAL_TABLET | Freq: Every day | ORAL | 1 refills | Status: DC

## 2018-04-09 NOTE — Progress Notes (Signed)
Name: Tommy May   MRN: [id number]    DOB: 05-18-1961   Date:04/09/2018       Progress Note  Subjective  Chief Complaint  Chief Complaint  Patient presents with  . CPE    not fasting     HPI  Patient presents for annual CPE.  USPSTF grade A and B recommendations:  Diet: not really watching his diet Exercise: he has a physical job-lifting and walking frequently, walking dog daily, but no routine exercise  Depression: no concerns for depression today, he does c/o anxiety Depression screen PHQ 2/9 11/23/2017  Decreased Interest 0  Down, Depressed, Hopeless 0  PHQ - 2 Score 0   Anxiety- This is not a new problem He has felt anxious for some time He says he often feels worried, restless and uneasy He has trouble sleeping at night because he can not stop worrying He denies thoughts of hurting himself or others  Hypertension -maintained on losartan 50 daily by cardiology Reports daily medication compliance without noted adverse medication effects. Reports recent readings at home 130s-140/80 when he checks occassionally  BP Readings from Last 3 Encounters:  04/09/18 140/84  02/06/18 (!) 142/88  11/23/17 (!) 162/88   Obesity: Wt Readings from Last 3 Encounters:  04/09/18 209 lb (94.8 kg)  02/06/18 210 lb (95.3 kg)  11/23/17 216 lb (98 kg)   BMI Readings from Last 3 Encounters:  04/09/18 29.99 kg/m  02/06/18 30.13 kg/m  11/23/17 30.99 kg/m    Cholesterol- maintained on zetia 10, crestor 40 by cardiology Also maintained on asa 81 Reports daily medication compliance without adverse medication effects including myalgias.  Lab Results  Component Value Date   CHOL 208 (H) 09/08/2016   HDL 41 09/08/2016   LDLCALC 125 09/08/2016   LDLDIRECT 132.0 04/08/2016   TRIG 212 (H) 09/08/2016   CHOLHDL 5.1 (H) 09/08/2016   Glucose:  Glucose, Bld  Date Value Ref Range Status  01/13/2017 161 (H) 65 - 99 mg/dL Final  01/06/2017 151 (H) 65 - 99 mg/dL Final  08/12/2016 95  65 - 99 mg/dL Final   Alcohol: no  Tobacco use: current daily smoker  STD testing and prevention (chl/gon/syphilis): no concerns, declines testing HIV, hep C: declines HIV screening, ordered hepatitis C screening today  Skin cancer: mole on right arm and back, present and unchanged for some time, larger than pencil eraser, uniform in color; Does not wear sunscreen  Colorectal cancer: No personal or family history of colon ca, no abdominal pain, no bowel changes, no rectal bleeding. cologuard up to date  Prostate cancer: follow with Dr Jeffie Pollock, urology provider, for routine PSA testing and management of enlarged prostates  Lung cancer:  Declines lung cancer screening  Aspirin: taking 81 daily ECG:  Not indicated   Vaccinations: up to date  Advanced Care Planning: A voluntary discussion about advance care planning including the explanation and discussion of advance directives.  Discussed health care proxy and Living will, and the patient DOES NOT have a living will at present time. If patient does have living will, I have requested they bring this to the clinic to be scanned in to their chart.  Patient Active Problem List   Diagnosis Date Noted  . Cough 02/06/2018  . Night sweats 02/06/2018  . Hypercholesteremia 11/23/2017  . Hypertension 11/23/2017  . Arthralgia of right temporomandibular joint 07/20/2017  . Urinary frequency 03/23/2017  . Asymptomatic carotid artery stenosis, left 01/12/2017  . Carotid artery disease ([place]) 04/27/2016  .  Routine general medical examination at a health care facility 01/13/2016  . Tobacco abuse 08/05/2014  . Right carotid bruit 07/17/2014  . OSA (obstructive sleep apnea) 04/21/2014  . 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 ETD (eustachian tube dysfunction) 02/17/2014  . Vertigo 02/17/2014  . Cervical disc disorder with radiculopathy of cervical region 03/20/2013  . Inguinal hernia 11/21/2011  . BACK PAIN, CHRONIC, INTERMITTENT 01/30/2009     Past Surgical History:  Procedure Laterality Date  . dental implants    . ENDARTERECTOMY Left 01/12/2017   Procedure: Left Carotid ENDARTERECTOMY;  Surgeon: Conrad [place], MD;  Location: [place];  Service: Vascular;  Laterality: Left;  . INGUINAL HERNIA REPAIR Left 08/25/2014   Procedure: LEFT INGUINAL HERNIA REPAIR REMOVAL SPERMATIC CORD MASS;  Surgeon: Jackolyn Confer, MD;  Location: [place];  Service: General;  Laterality: Left;  . INSERTION OF MESH N/A 08/25/2014   Procedure: INSERTION OF MESH;  Surgeon: Jackolyn Confer, MD;  Location: [place];  Service: General;  Laterality: N/A;  . PATCH ANGIOPLASTY Left 01/12/2017   Procedure: PATCH ANGIOPLASTY;  Surgeon: Conrad [place], MD;  Location: [place];  Service: Vascular;  Laterality: Left;  . ROTATOR CUFF REPAIR Left     Family History  Problem Relation Age of Onset  . Stroke Father 64  . Diabetes Father   . Diabetes Paternal Grandmother   . Stroke Paternal Grandfather 42       guess early 47's  . Heart attack Maternal Grandfather     Social History   Socioeconomic History  . Marital status: Married    Spouse name: Not on file  . Number of children: 2  . Years of education: 26  . Highest education level: Not on file  Occupational History  . Occupation: Haematologist Needs  . Financial resource strain: Not on file  . Food insecurity:    Worry: Not on file    Inability: Not on file  . Transportation needs:    Medical: Not on file    Non-medical: Not on file  Tobacco Use  . Smoking status: Current Every Day Smoker    Packs/day: 0.10    Years: 43.00    Pack years: 4.30    Types: Cigarettes  . Smokeless tobacco: Never Used  . Tobacco comment: Down to 1/2 pk per day  Substance and Sexual Activity  . Alcohol use: No  . Drug use: No  . Sexual activity: Not on file  Lifestyle  . Physical activity:    Days per week: Not on file    Minutes per session: Not on file  . Stress: Not on file  Relationships  . Social  connections:    Talks on phone: Not on file    Gets together: Not on file    Attends religious service: Not on file    Active member of club or organization: Not on file    Attends meetings of clubs or organizations: Not on file    Relationship status: Not on file  . Intimate partner violence:    Fear of current or ex partner: Not on file    Emotionally abused: Not on file    Physically abused: Not on file    Forced sexual activity: Not on file  Other Topics Concern  . Not on file  Social History Narrative   Pt lives with his wife and two children.  Graduated high school.     Current Outpatient Medications:  .  acetaminophen (TYLENOL) 500 MG tablet,  Take 1,000 mg by mouth every 8 (eight) hours as needed for moderate pain. , Disp: , Rfl:  .  alfuzosin (UROXATRAL) 10 MG 24 hr tablet, Take 10 mg by mouth daily with breakfast., Disp: , Rfl:  .  aspirin EC 81 MG tablet, Take 1 tablet (81 mg total) by mouth daily. (Patient taking differently: Take 81 mg by mouth at bedtime. ), Disp: 90 tablet, Rfl: 3 .  Cholecalciferol (VITAMIN D-3) 5000 units TABS, Take 5,000 Units by mouth daily., Disp: , Rfl:  .  Cyanocobalamin (VITAMIN B-12) 2500 MCG SUBL, Place 5,000 mcg under the tongue daily., Disp: , Rfl:  .  esomeprazole (NEXIUM) 40 MG capsule, Take 40 mg by mouth daily with breakfast. , Disp: , Rfl:  .  ezetimibe (ZETIA) 10 MG tablet, Take 1 tablet (10 mg total) by mouth daily. Please schedule appointment for refills 2nd attmpt, Disp: 15 tablet, Rfl: 0 .  gabapentin (NEURONTIN) 300 MG capsule, Take 1 capsule (300 mg total) by mouth at bedtime., Disp: 90 capsule, Rfl: 2 .  ibuprofen (ADVIL,MOTRIN) 200 MG tablet, Take 400 mg by mouth every 8 (eight) hours as needed (for pain.)., Disp: , Rfl:  .  loratadine (CLARITIN) 10 MG tablet, Take 10 mg by mouth daily. , Disp: , Rfl:  .  Multiple Vitamin (MULTIVITAMIN WITH MINERALS) TABS tablet, Take 1 tablet by mouth daily., Disp: , Rfl:  .  nicotine polacrilex  (NICORETTE) 2 MG gum, Take 2 mg by mouth every 4 (four) hours as needed for smoking cessation., Disp: , Rfl:  .  rosuvastatin (CRESTOR) 40 MG tablet, TAKE 1 TABLET BY MOUTH EVERY DAY, Disp: 30 tablet, Rfl: 11 .  traMADol (ULTRAM) 50 MG tablet, Take 1 tablet (50 mg total) by mouth 2 (two) times daily., Disp: 60 tablet, Rfl: 0 .  losartan (COZAAR) 50 MG tablet, TAKE 1 TABLET (50 MG TOTAL) BY MOUTH DAILY., Disp: 90 tablet, Rfl: 3  Allergies  Allergen Reactions  . Atorvastatin Other (See Comments)    MYALGIAS     ROS  Constitutional: Negative for fever or weight change.  Respiratory: Negative for cough and shortness of breath.   Cardiovascular: Negative for chest pain or palpitations.  Gastrointestinal: Negative for abdominal pain, no bowel changes.  Musculoskeletal: Negative for gait problem or joint swelling.  Skin: Negative for rash.  Neurological: Negative for dizziness or headache.  No other specific complaints in a complete review of systems (except as listed in HPI above).   Objective  Vitals:   04/09/18 1601  BP: 140/84  Pulse: 83  Resp: 16  Temp: 98.1 F (36.7 C)  TempSrc: Oral  SpO2: 96%  Weight: 209 lb (94.8 kg)  Height: 5\' 10"  (1.778 m)    Body mass index is 29.99 kg/m.  Physical Exam Vital signs reviewed. Constitutional: Patient appears well-developed and well-nourished. No distress.  HENT: Head: Normocephalic and atraumatic. Ears: B TMs ok, no erythema or effusion; Nose: Nose normal. Mouth/Throat: Oropharynx is clear and moist. No oropharyngeal exudate.  Eyes: Conjunctivae and EOM are normal. Pupils are equal, round, and reactive to light. No scleral icterus.  Neck: Normal range of motion. Neck supple. No cervical adenopathy. No thyromegaly present.  Cardiovascular: Normal rate, regular rhythm and normal heart sounds.  No murmur heard. No BLE edema. Distal pulses intact. Pulmonary/Chest: Effort normal and breath sounds normal. No respiratory  distress. Abdominal: Soft. Bowel sounds are normal, no distension. There is no tenderness. no masses. Musculoskeletal: Normal range of motion. No gross deformities  Neurological: he is alert and oriented to person, place, and time. No cranial nerve deficit. Coordination, balance, strength, speech and gait are normal.  Skin: Skin is warm and dry. No rash noted. No erythema.  Psychiatric: Patient is anxious. behavior is normal. Judgment and thought content normal.  Fall Risk: Fall Risk  11/23/2017 09/21/2015  Falls in the past year? No No    Assessment & Plan RTC in 1 month for F/U: anxiety- starting lexapro  Skin problem Concern for larger moles to his arms and back, likely related to  History of sun exposure without sunscreen Discussed referral to derm for further evaluation and management of moles and annual skin check and he is agreeable - Ambulatory referral to Dermatology

## 2018-04-09 NOTE — Patient Instructions (Addendum)
Please head downstairs for lab work/x-rays. If any of your test results are critically abnormal, you will be contacted right away. Your results may be released to your MyChart for viewing before I am able to provide you with my response. I will contact you within a week about your test results and any recommendations for abnormalities.  Please call Dr Lonia Skinner office to schedule your annual follow up.   I have sent a prescription for lexapro 10mg  tablets to your pharmacy. Please start 1/2 tablet once daily for 1 week and then increase to a full tablet once daily on week two as tolerated.  Some side effects such as nausea, drowsiness and weight gain can occur.  Also rarely people have experienced suicidal thoughts when taking this medication.  Please discontinue the medication and go directly to ED if this occurs.  Id like to see you back in about 1 month to evaluate progress.    Please return in about 1 month so I can see how you are doing on the lexapro.     Living With Anxiety After being diagnosed with an anxiety disorder, you may be relieved to know why you have felt or behaved a certain way. It is natural to also feel overwhelmed about the treatment ahead and what it will mean for your life. With care and support, you can manage this condition and recover from it. How to cope with anxiety Dealing with stress Stress is your body's reaction to life changes and events, both good and bad. Stress can last just a few hours or it can be ongoing. Stress can play a major role in anxiety, so it is important to learn both how to cope with stress and how to think about it differently. Talk with your health care provider or a counselor to learn more about stress reduction. He or she may suggest some stress reduction techniques, such as:  Music therapy. This can include creating or listening to music that you enjoy and that inspires you.  Mindfulness-based meditation. This involves being aware of your  normal breaths, rather than trying to control your breathing. It can be done while sitting or walking.  Centering prayer. This is a kind of meditation that involves focusing on a word, phrase, or sacred image that is meaningful to you and that brings you peace.  Deep breathing. To do this, expand your stomach and inhale slowly through your nose. Hold your breath for 3-5 seconds. Then exhale slowly, allowing your stomach muscles to relax.  Self-talk. This is a skill where you identify thought patterns that lead to anxiety reactions and correct those thoughts.  Muscle relaxation. This involves tensing muscles then relaxing them.  Choose a stress reduction technique that fits your lifestyle and personality. Stress reduction techniques take time and practice. Set aside 5-15 minutes a day to do them. Therapists can offer training in these techniques. The training may be covered by some insurance plans. Other things you can do to manage stress include:  Keeping a stress diary. This can help you learn what triggers your stress and ways to control your response.  Thinking about how you respond to certain situations. You may not be able to control everything, but you can control your reaction.  Making time for activities that help you relax, and not feeling guilty about spending your time in this way.  Therapy combined with coping and stress-reduction skills provides the best chance for successful treatment. Medicines Medicines can help ease symptoms. Medicines for anxiety  include:  Anti-anxiety drugs.  Antidepressants.  Beta-blockers.  Medicines may be used as the main treatment for anxiety disorder, along with therapy, or if other treatments are not working. Medicines should be prescribed by a health care provider. Relationships Relationships can play a big part in helping you recover. Try to spend more time connecting with trusted friends and family members. Consider going to couples  counseling, taking family education classes, or going to family therapy. Therapy can help you and others better understand the condition. How to recognize changes in your condition Everyone has a different response to treatment for anxiety. Recovery from anxiety happens when symptoms decrease and stop interfering with your daily activities at home or work. This may mean that you will start to:  Have better concentration and focus.  Sleep better.  Be less irritable.  Have more energy.  Have improved memory.  It is important to recognize when your condition is getting worse. Contact your health care provider if your symptoms interfere with home or work and you do not feel like your condition is improving. Where to find help and support: You can get help and support from these sources:  Self-help groups.  Online and [employer].  A trusted spiritual leader.  Couples counseling.  Family education classes.  Family therapy.  Follow these instructions at home:  Eat a healthy diet that includes plenty of vegetables, fruits, whole grains, low-fat dairy products, and lean protein. Do not eat a lot of foods that are high in solid fats, added sugars, or salt.  Exercise. Most adults should do the following: ? Exercise for at least 150 minutes each week. The exercise should increase your heart rate and make you sweat (moderate-intensity exercise). ? Strengthening exercises at least twice a week.  Cut down on caffeine, tobacco, alcohol, and other potentially harmful substances.  Get the right amount and quality of sleep. Most adults need 7-9 hours of sleep each night.  Make choices that simplify your life.  Take over-the-counter and prescription medicines only as told by your health care provider.  Avoid caffeine, alcohol, and certain over-the-counter cold medicines. These may make you feel worse. Ask your pharmacist which medicines to avoid.  Keep all follow-up visits as  told by your health care provider. This is important. Questions to ask your health care provider  Would I benefit from therapy?  How often should I follow up with a health care provider?  How long do I need to take medicine?  Are there any long-term side effects of my medicine?  Are there any alternatives to taking medicine? Contact a health care provider if:  You have a hard time staying focused or finishing daily tasks.  You spend many hours a day feeling worried about everyday life.  You become exhausted by worry.  You start to have headaches, feel tense, or have nausea.  You urinate more than normal.  You have diarrhea. Get help right away if:  You have a racing heart and shortness of breath.  You have thoughts of hurting yourself or others. If you ever feel like you may hurt yourself or others, or have thoughts about taking your own life, get help right away. You can go to your nearest emergency department or call:  Your local emergency services (911 in the [place]  A suicide crisis helpline, such as the [place] at 219-289-1993. This is open 24-hours a day.  Summary  Taking steps to deal with stress can help calm you.  Medicines cannot cure anxiety disorders, but they can help ease symptoms.  Family, friends, and partners can play a big part in helping you recover from an anxiety disorder. This information is not intended to replace advice given to you by your health care provider. Make sure you discuss any questions you have with your health care provider. Document Released: 11/01/2016 Document Revised: 11/01/2016 Document Reviewed: 11/01/2016 Elsevier Interactive Patient Education  2018 [place] Maintenance, Male A healthy lifestyle and preventive care is important for your health and wellness. Ask your health care provider about what schedule of regular examinations is right for you. What should I know about weight  and diet? Eat a Healthy Diet  Eat plenty of vegetables, fruits, whole grains, low-fat dairy products, and lean protein.  Do not eat a lot of foods high in solid fats, added sugars, or salt.  Maintain a Healthy Weight Regular exercise can help you achieve or maintain a healthy weight. You should:  Do at least 150 minutes of exercise each week. The exercise should increase your heart rate and make you sweat (moderate-intensity exercise).  Do strength-training exercises at least twice a week.  Watch Your Levels of Cholesterol and Blood Lipids  Have your blood tested for lipids and cholesterol every 5 years starting at 59 years of age. If you are at high risk for heart disease, you should start having your blood tested when you are 59 years old. You may need to have your cholesterol levels checked more often if: ? Your lipid or cholesterol levels are high. ? You are older than 59 years of age. ? You are at high risk for heart disease.  What should I know about cancer screening? Many types of cancers can be detected early and may often be prevented. Lung Cancer  You should be screened every year for lung cancer if: ? You are a current smoker who has smoked for at least 30 years. ? You are a former smoker who has quit within the past 15 years.  Talk to your health care provider about your screening options, when you should start screening, and how often you should be screened.  Colorectal Cancer  Routine colorectal cancer screening usually begins at 59 years of age and should be repeated every 5-10 years until you are 59 years old. You may need to be screened more often if early forms of precancerous polyps or small growths are found. Your health care provider may recommend screening at an earlier age if you have risk factors for colon cancer.  Your health care provider may recommend using home test kits to check for hidden blood in the stool.  A small camera at the end of a tube can  be used to examine your colon (sigmoidoscopy or colonoscopy). This checks for the earliest forms of colorectal cancer.  Prostate and Testicular Cancer  Depending on your age and overall health, your health care provider may do certain tests to screen for prostate and testicular cancer.  Talk to your health care provider about any symptoms or concerns you have about testicular or prostate cancer.  Skin Cancer  Check your skin from head to toe regularly.  Tell your health care provider about any new moles or changes in moles, especially if: ? There is a change in a mole's size, shape, or color. ? You have a mole that is larger than a pencil eraser.  Always use sunscreen. Apply sunscreen liberally and repeat throughout the day.  Protect  yourself by wearing long sleeves, pants, a wide-brimmed hat, and sunglasses when outside.  What should I know about heart disease, diabetes, and high blood pressure?  If you are 33-65 years of age, have your blood pressure checked every 3-5 years. If you are 19 years of age or older, have your blood pressure checked every year. You should have your blood pressure measured twice-once when you are at a hospital or clinic, and once when you are not at a hospital or clinic. Record the average of the two measurements. To check your blood pressure when you are not at a hospital or clinic, you can use: ? An automated blood pressure machine at a pharmacy. ? A home blood pressure monitor.  Talk to your health care provider about your target blood pressure.  If you are between 32-32 years old, ask your health care provider if you should take aspirin to prevent heart disease.  Have regular diabetes screenings by checking your fasting blood sugar level. ? If you are at a normal weight and have a low risk for diabetes, have this test once every three years after the age of 84. ? If you are overweight and have a high risk for diabetes, consider being tested at a  younger age or more often.  A one-time screening for abdominal aortic aneurysm (AAA) by ultrasound is recommended for men aged 74-75 years who are current or former smokers. What should I know about preventing infection? Hepatitis B If you have a higher risk for hepatitis B, you should be screened for this virus. Talk with your health care provider to find out if you are at risk for hepatitis B infection. Hepatitis C Blood testing is recommended for:  Everyone born from [birth year] through 1967.  Anyone with known risk factors for hepatitis C.  Sexually Transmitted Diseases (STDs)  You should be screened each year for STDs including gonorrhea and chlamydia if: ? You are sexually active and are younger than 59 years of age. ? You are older than 59 years of age and your health care provider tells you that you are at risk for this type of infection. ? Your sexual activity has changed since you were last screened and you are at an increased risk for chlamydia or gonorrhea. Ask your health care provider if you are at risk.  Talk with your health care provider about whether you are at high risk of being infected with HIV. Your health care provider may recommend a prescription medicine to help prevent HIV infection.  What else can I do?  Schedule regular health, dental, and eye exams.  Stay current with your vaccines (immunizations).  Do not use any tobacco products, such as cigarettes, chewing tobacco, and e-cigarettes. If you need help quitting, ask your health care provider.  Limit alcohol intake to no more than 2 drinks per day. One drink equals 12 ounces of beer, 5 ounces of wine, or 1 ounces of hard liquor.  Do not use street drugs.  Do not share needles.  Ask your health care provider for help if you need support or information about quitting drugs.  Tell your health care provider if you often feel depressed.  Tell your health care provider if you have ever been abused or do not  feel safe at home. This information is not intended to replace advice given to you by your health care provider. Make sure you discuss any questions you have with your health care provider. Document Released: 05/05/2008 Document Revised:  07/06/2016 Document Reviewed: 08/11/2015 Elsevier Interactive Patient Education  [employer].

## 2018-04-10 LAB — HEPATITIS C ANTIBODY
Hepatitis C Ab: NONREACTIVE
SIGNAL TO CUT-OFF: 0.01 (ref <1.00)

## 2018-04-10 LAB — LDL CHOLESTEROL, DIRECT: Direct LDL: 82 mg/dL

## 2018-04-10 LAB — TSH: TSH: 1.82 u[IU]/mL (ref 0.35–4.50)

## 2018-04-11 ENCOUNTER — Other Ambulatory Visit: Payer: Self-pay | Admitting: Nurse Practitioner

## 2018-04-11 DIAGNOSIS — M5010 Cervical disc disorder with radiculopathy, unspecified cervical region: Secondary | ICD-10-CM

## 2018-04-11 MED ORDER — TRAMADOL HCL 50 MG PO TABS: 50.0000 mg | ORAL_TABLET | Freq: Two times a day (BID) | ORAL | 0 refills | Status: DC

## 2018-04-11 NOTE — Telephone Encounter (Signed)
Last refill was 03/15/18 #60 per database. Please advise

## 2018-04-16 ENCOUNTER — Encounter: Payer: Self-pay | Admitting: Nurse Practitioner

## 2018-04-16 DIAGNOSIS — F419 Anxiety disorder, unspecified: Secondary | ICD-10-CM | POA: Insufficient documentation

## 2018-04-16 NOTE — Assessment & Plan Note (Signed)
-  USPSTF grade A and B recommendations reviewed with patient; age-appropriate recommendations, preventive care, screening tests, etc discussed and encouraged; healthy living, sunscreen use and smoking cessation encouraged; see AVS for patient education given to patient -Discussed importance of 150 minutes of physical activity weekly, eat 6 servings of fruit/vegetables daily and drink plenty of water and avoid sweet beverages.  -Red flags and when to present for emergency care or RTC including fever >101.53F, chest pain, shortness of breath, new/worsening/un-resolving symptoms, reviewed with patient at time of visit. Follow up and care instructions discussed and provided in AVS.   -Reviewed Health Maintenance:  Encounter for hepatitis C virus screening test for high risk patient- Hepatitis C antibody; Future  Elevated random blood glucose level- Hemoglobin A1c; Future

## 2018-04-16 NOTE — Assessment & Plan Note (Addendum)
Ate a bowl of cereal about 3 hours before OV Will update lipid panel today for his convenience We discussed calling cardiology office to schedule annual F/U - Lipid panel; Future

## 2018-04-16 NOTE — Assessment & Plan Note (Signed)
Uncontrolled anxiety preventing adequate sleep Discussed trial of SSRI to see if his anxiety improves and would like to try lexapro- rx given with dosing and side effects discussed Also discussed home management of anxiety and return precautions and printed additional information on AVS Update labs today RTC in 1 month for F/U - CBC; Future - Comprehensive metabolic panel; Future - TSH; Future - escitalopram (LEXAPRO) 10 MG tablet; Take 1 tablet (10 mg total) by mouth daily.  Dispense: 30 tablet; Refill: 1

## 2018-04-16 NOTE — Assessment & Plan Note (Signed)
BP reading normal today with reportedly normal home readings, continue losartan His losartan prescription appears outdated but he says he does have  losartan at home and is taking it daily  Per chart review it looks like he is overdue for annual cardiology, he was instructed to call Dr Lonia Skinner office to schedule annual follow up Continue to monitor BP at home and F/U for readings >140/90 - CBC; Future - Comprehensive metabolic panel; Future

## 2018-05-05 ENCOUNTER — Other Ambulatory Visit: Payer: Self-pay | Admitting: Cardiology

## 2018-05-08 ENCOUNTER — Other Ambulatory Visit: Payer: Self-pay | Admitting: Nurse Practitioner

## 2018-05-08 DIAGNOSIS — M5010 Cervical disc disorder with radiculopathy, unspecified cervical region: Secondary | ICD-10-CM

## 2018-05-08 MED ORDER — TRAMADOL HCL 50 MG PO TABS: 50.0000 mg | ORAL_TABLET | Freq: Two times a day (BID) | ORAL | 0 refills | Status: DC

## 2018-05-08 NOTE — Telephone Encounter (Signed)
Last refill was 04/11/18 per database.

## 2018-05-18 ENCOUNTER — Other Ambulatory Visit: Payer: Self-pay | Admitting: *Deleted

## 2018-05-21 MED ORDER — EZETIMIBE 10 MG PO TABS: ORAL_TABLET | ORAL | 0 refills | Status: DC

## 2018-06-02 ENCOUNTER — Other Ambulatory Visit: Payer: Self-pay | Admitting: Nurse Practitioner

## 2018-06-02 DIAGNOSIS — F419 Anxiety disorder, unspecified: Secondary | ICD-10-CM

## 2018-06-04 ENCOUNTER — Other Ambulatory Visit: Payer: Self-pay | Admitting: Nurse Practitioner

## 2018-06-04 DIAGNOSIS — M5010 Cervical disc disorder with radiculopathy, unspecified cervical region: Secondary | ICD-10-CM

## 2018-06-04 MED ORDER — TRAMADOL HCL 50 MG PO TABS: 50.0000 mg | ORAL_TABLET | Freq: Two times a day (BID) | ORAL | 0 refills | Status: DC

## 2018-06-04 NOTE — Telephone Encounter (Signed)
Last refill was 05/08/18 #60.

## 2018-06-20 ENCOUNTER — Other Ambulatory Visit: Payer: Self-pay | Admitting: Cardiology

## 2018-06-20 NOTE — Telephone Encounter (Signed)
Rx sent to pharmacy   

## 2018-06-26 NOTE — Progress Notes (Signed)
HPI: FU hyperlipidemia and hypertension. Nuclear study September 2003 showed ejection fraction 72% and normal perfusion. Echocardiogram September 2016 showed normal LV systolic function and grade 1 diastolic dysfunction. Carotid Dopplers December 2017 showed 80-99% left and 1-39% right stenosis. He underwent carotid endarterectomy and carotid is now followed by vascular surgery.  Since last seen, the patient denies any dyspnea on exertion, orthopnea, PND, pedal edema, palpitations, syncope or chest pain.   Current Outpatient Medications  Medication Sig Dispense Refill  . acetaminophen (TYLENOL) 500 MG tablet Take 1,000 mg by mouth every 8 (eight) hours as needed for moderate pain.     Marland Kitchen aspirin EC 81 MG tablet Take 1 tablet (81 mg total) by mouth daily. (Patient taking differently: Take 81 mg by mouth at bedtime. ) 90 tablet 3  . Cholecalciferol (VITAMIN D-3) 5000 units TABS Take 5,000 Units by mouth daily.    . Cyanocobalamin (VITAMIN B-12) 2500 MCG SUBL Place 5,000 mcg under the tongue daily.    Marland Kitchen escitalopram (LEXAPRO) 10 MG tablet TAKE 1 TABLET BY MOUTH EVERY DAY 30 tablet 1  . esomeprazole (NEXIUM) 40 MG capsule Take 40 mg by mouth daily with breakfast.     . ezetimibe (ZETIA) 10 MG tablet TAKE 1 TABLET EVERY DAY. Keep scheduled appointment for further refills. 30 tablet 0  . gabapentin (NEURONTIN) 300 MG capsule Take 1 capsule (300 mg total) by mouth at bedtime. 90 capsule 2  . loratadine (CLARITIN) 10 MG tablet Take 10 mg by mouth daily.     . Multiple Vitamin (MULTIVITAMIN WITH MINERALS) TABS tablet Take 1 tablet by mouth daily.    . nicotine polacrilex (NICORETTE) 2 MG gum Take 2 mg by mouth every 4 (four) hours as needed for smoking cessation.    . rosuvastatin (CRESTOR) 40 MG tablet TAKE 1 TABLET BY MOUTH EVERY DAY 30 tablet 11  . traMADol (ULTRAM) 50 MG tablet Take 1 tablet (50 mg total) by mouth 2 (two) times daily. 60 tablet 0  . losartan (COZAAR) 50 MG tablet TAKE 1 TABLET  (50 MG TOTAL) BY MOUTH DAILY. 90 tablet 3   No current facility-administered medications for this visit.      Past Medical History:  Diagnosis Date  . Allergy   . Carotid artery occlusion   . Environmental and seasonal allergies   . GERD (gastroesophageal reflux disease)   . Heart palpitations   . History of Holter monitoring   . Hyperlipidemia   . Hypertension    does not take meds  . Nerve pain    neck; takes gabapentin  . Sleep apnea    wears CPAP nightly    Past Surgical History:  Procedure Laterality Date  . dental implants    . ENDARTERECTOMY Left 01/12/2017   Procedure: Left Carotid ENDARTERECTOMY;  Surgeon: Conrad [place], MD;  Location: [place];  Service: Vascular;  Laterality: Left;  . INGUINAL HERNIA REPAIR Left 08/25/2014   Procedure: LEFT INGUINAL HERNIA REPAIR REMOVAL SPERMATIC CORD MASS;  Surgeon: Jackolyn Confer, MD;  Location: [place];  Service: General;  Laterality: Left;  . INSERTION OF MESH N/A 08/25/2014   Procedure: INSERTION OF MESH;  Surgeon: Jackolyn Confer, MD;  Location: [place];  Service: General;  Laterality: N/A;  . PATCH ANGIOPLASTY Left 01/12/2017   Procedure: PATCH ANGIOPLASTY;  Surgeon: Conrad Winfield, MD;  Location: [place];  Service: Vascular;  Laterality: Left;  . ROTATOR CUFF REPAIR Left     Social History  Socioeconomic History  . Marital status: Married    Spouse name: Not on file  . Number of children: 2  . Years of education: 63  . Highest education level: Not on file  Occupational History  . Occupation: Haematologist Needs  . Financial resource strain: Not on file  . Food insecurity:    Worry: Not on file    Inability: Not on file  . Transportation needs:    Medical: Not on file    Non-medical: Not on file  Tobacco Use  . Smoking status: Current Every Day Smoker    Packs/day: 0.10    Years: 43.00    Pack years: 4.30    Types: Cigarettes  . Smokeless tobacco: Never Used  . Tobacco comment: Down to 1/2 pk per day  Substance  and Sexual Activity  . Alcohol use: No  . Drug use: No  . Sexual activity: Not on file  Lifestyle  . Physical activity:    Days per week: Not on file    Minutes per session: Not on file  . Stress: Not on file  Relationships  . Social connections:    Talks on phone: Not on file    Gets together: Not on file    Attends religious service: Not on file    Active member of club or organization: Not on file    Attends meetings of clubs or organizations: Not on file    Relationship status: Not on file  . Intimate partner violence:    Fear of current or ex partner: Not on file    Emotionally abused: Not on file    Physically abused: Not on file    Forced sexual activity: Not on file  Other Topics Concern  . Not on file  Social History Narrative   Pt lives with his wife and two children.  Graduated high school.    Family History  Problem Relation Age of Onset  . Stroke Father 56  . Diabetes Father   . Diabetes Paternal Grandmother   . Stroke Paternal Grandfather 50       guess early 100's  . Heart attack Maternal Grandfather     ROS: no fevers or chills, productive cough, hemoptysis, dysphasia, odynophagia, melena, hematochezia, dysuria, hematuria, rash, seizure activity, orthopnea, PND, pedal edema, claudication. Remaining systems are negative.  Physical Exam: Well-developed well-nourished in no acute distress.  Skin is warm and dry.  HEENT is normal.  Neck is supple.  Chest is clear to auscultation with normal expansion.  Cardiovascular exam is regular rate and rhythm.  Abdominal exam nontender or distended. No masses palpated. Extremities show no edema. neuro grossly intact  ECG-normal sinus rhythm at a rate of 71.  No ST changes.  Personally reviewed  A/P  1 hypertension-blood pressure is controlled.  Continue present medications and follow.  2 hyperlipidemia-continue Crestor and Zetia.  3 tobacco abuse-patient counseled on discontinuing.  4 carotid artery  disease-continue aspirin and statin.  Followed by vascular surgery.  Kirk Ruths, MD

## 2018-06-29 ENCOUNTER — Ambulatory Visit: Payer: Managed Care, Other (non HMO) | Admitting: Cardiology

## 2018-06-29 ENCOUNTER — Encounter: Payer: Self-pay | Admitting: Cardiology

## 2018-06-29 VITALS — BP 130/68 | HR 71 | Ht 70.0 in | Wt 206.0 lb

## 2018-06-29 DIAGNOSIS — R002 Palpitations: Secondary | ICD-10-CM

## 2018-06-29 DIAGNOSIS — Z720 Tobacco use: Secondary | ICD-10-CM

## 2018-06-29 DIAGNOSIS — I6523 Occlusion and stenosis of bilateral carotid arteries: Secondary | ICD-10-CM

## 2018-06-29 DIAGNOSIS — E7800 Pure hypercholesterolemia, unspecified: Secondary | ICD-10-CM

## 2018-06-29 DIAGNOSIS — I10 Essential (primary) hypertension: Secondary | ICD-10-CM

## 2018-06-29 NOTE — Patient Instructions (Signed)
Your physician wants you to follow-up in: ONE YEAR WITH DR CRENSHAW You will receive a reminder letter in the mail two months in advance. If you don't receive a letter, please call our office to schedule the follow-up appointment.   If you need a refill on your cardiac medications before your next appointment, please call your pharmacy.  

## 2018-06-30 ENCOUNTER — Other Ambulatory Visit: Payer: Self-pay | Admitting: Nurse Practitioner

## 2018-06-30 DIAGNOSIS — M5010 Cervical disc disorder with radiculopathy, unspecified cervical region: Secondary | ICD-10-CM

## 2018-07-02 NOTE — Telephone Encounter (Signed)
Last refill was 06/04/18 #60 per database.

## 2018-07-03 ENCOUNTER — Other Ambulatory Visit: Payer: Self-pay | Admitting: Nurse Practitioner

## 2018-07-03 DIAGNOSIS — M5010 Cervical disc disorder with radiculopathy, unspecified cervical region: Secondary | ICD-10-CM

## 2018-07-03 MED ORDER — TRAMADOL HCL 50 MG PO TABS: 50.0000 mg | ORAL_TABLET | Freq: Two times a day (BID) | ORAL | 0 refills | Status: DC

## 2018-07-04 NOTE — Telephone Encounter (Signed)
Last refill was 07/03/2018 per database

## 2018-07-19 ENCOUNTER — Encounter: Payer: Self-pay | Admitting: Nurse Practitioner

## 2018-07-19 ENCOUNTER — Ambulatory Visit: Payer: Managed Care, Other (non HMO) | Admitting: Nurse Practitioner

## 2018-07-19 VITALS — BP 126/84 | HR 86 | Ht 70.0 in | Wt 203.0 lb

## 2018-07-19 DIAGNOSIS — L089 Local infection of the skin and subcutaneous tissue, unspecified: Secondary | ICD-10-CM | POA: Diagnosis not present

## 2018-07-19 MED ORDER — DOXYCYCLINE HYCLATE 100 MG PO TABS: 100.0000 mg | ORAL_TABLET | Freq: Two times a day (BID) | ORAL | 0 refills | Status: DC

## 2018-07-19 NOTE — Patient Instructions (Signed)
Please start doxycycline 100 mg twice a day for 7 days.   Cellulitis, Adult Cellulitis is a skin infection. The infected area is usually red and sore. This condition occurs most often in the arms and lower legs. It is very important to get treated for this condition. Follow these instructions at home:  Take over-the-counter and prescription medicines only as told by your doctor.  If you were prescribed an antibiotic medicine, take it as told by your doctor. Do not stop taking the antibiotic even if you start to feel better.  Drink enough fluid to keep your pee (urine) clear or pale yellow.  Do not touch or rub the infected area.  Raise (elevate) the infected area above the level of your heart while you are sitting or lying down.  Place warm or cold wet cloths (warm or cold compresses) on the infected area. Do this as told by your doctor.  Keep all follow-up visits as told by your doctor. This is important. These visits let your doctor make sure your infection is not getting worse. Contact a doctor if:  You have a fever.  Your symptoms do not get better after 1-2 days of treatment.  Your bone or joint under the infected area starts to hurt after the skin has healed.  Your infection comes back. This can happen in the same area or another area.  You have a swollen bump in the infected area.  You have new symptoms.  You feel ill and also have muscle aches and pains. Get help right away if:  Your symptoms get worse.  You feel very sleepy.  You throw up (vomit) or have watery poop (diarrhea) for a long time.  There are red streaks coming from the infected area.  Your red area gets larger.  Your red area turns darker. This information is not intended to replace advice given to you by your health care provider. Make sure you discuss any questions you have with your health care provider. Document Released: 04/25/2008 Document Revised: 04/14/2016 Document Reviewed:  09/16/2015 Elsevier Interactive Patient Education  2018 [employer].

## 2018-07-19 NOTE — Progress Notes (Signed)
Name: Tommy May   MRN: [id number]    DOB: 1961/06/02   Date:07/19/2018       Progress Note  Subjective  Chief Complaint Finger injury   HPI Tommy May is here today for acute evaluation of finger injury. He cut his right thumb at the tip around thumbnail while tightening a screw on a fence about 2 weeks ago, there was initially a cut that seemed to heal but over the past few days hes notice increasing pain, swelling and redness to the area and now appears to have pus under the skin. He denies weakness, fevers, chills, malaise. Started applying antibiotic ointment to the area today and it seems to have improved slightly.   Patient Active Problem List   Diagnosis Date Noted  . Anxiety 04/16/2018  . Hyperlipidemia 11/23/2017  . Hypertension 11/23/2017  . Asymptomatic carotid artery stenosis, left 01/12/2017  . Carotid artery disease ([place]) 04/27/2016  . Encounter for general adult medical examination with abnormal findings 01/13/2016  . Tobacco abuse 08/05/2014  . Right carotid bruit 07/17/2014  . OSA (obstructive sleep apnea) 04/21/2014  . 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 ETD (eustachian tube dysfunction) 02/17/2014  . Vertigo 02/17/2014  . Cervical disc disorder with radiculopathy of cervical region 03/20/2013  . Inguinal hernia 11/21/2011  . BACK PAIN, CHRONIC, INTERMITTENT 01/30/2009    Social History   Tobacco Use  . Smoking status: Current Every Day Smoker    Packs/day: 0.10    Years: 43.00    Pack years: 4.30    Types: Cigarettes  . Smokeless tobacco: Never Used  . Tobacco comment: Down to 1/2 pk per day  Substance Use Topics  . Alcohol use: No     Current Outpatient Medications:  .  acetaminophen (TYLENOL) 500 MG tablet, Take 1,000 mg by mouth every 8 (eight) hours as needed for moderate pain. , Disp: , Rfl:  .  aspirin EC 81 MG tablet, Take 1 tablet (81 mg total) by mouth daily. (Patient taking differently: Take 81 mg by mouth at bedtime. ), Disp: 90  tablet, Rfl: 3 .  Cholecalciferol (VITAMIN D-3) 5000 units TABS, Take 5,000 Units by mouth daily., Disp: , Rfl:  .  Cyanocobalamin (VITAMIN B-12) 2500 MCG SUBL, Place 5,000 mcg under the tongue daily., Disp: , Rfl:  .  escitalopram (LEXAPRO) 10 MG tablet, TAKE 1 TABLET BY MOUTH EVERY DAY, Disp: 30 tablet, Rfl: 1 .  esomeprazole (NEXIUM) 40 MG capsule, Take 40 mg by mouth daily with breakfast. , Disp: , Rfl:  .  ezetimibe (ZETIA) 10 MG tablet, TAKE 1 TABLET EVERY DAY. Keep scheduled appointment for further refills., Disp: 30 tablet, Rfl: 0 .  gabapentin (NEURONTIN) 300 MG capsule, Take 1 capsule (300 mg total) by mouth at bedtime., Disp: 90 capsule, Rfl: 2 .  loratadine (CLARITIN) 10 MG tablet, Take 10 mg by mouth daily. , Disp: , Rfl:  .  Multiple Vitamin (MULTIVITAMIN WITH MINERALS) TABS tablet, Take 1 tablet by mouth daily., Disp: , Rfl:  .  nicotine polacrilex (NICORETTE) 2 MG gum, Take 2 mg by mouth every 4 (four) hours as needed for smoking cessation., Disp: , Rfl:  .  rosuvastatin (CRESTOR) 40 MG tablet, TAKE 1 TABLET BY MOUTH EVERY DAY, Disp: 30 tablet, Rfl: 11 .  tamsulosin (FLOMAX) 0.4 MG CAPS capsule, Take 0.4 mg by mouth 2 (two) times daily., Disp: , Rfl: 11 .  traMADol (ULTRAM) 50 MG tablet, Take 1 tablet (50 mg total) by mouth 2 (two) times  daily., Disp: 60 tablet, Rfl: 0 .  losartan (COZAAR) 50 MG tablet, TAKE 1 TABLET (50 MG TOTAL) BY MOUTH DAILY., Disp: 90 tablet, Rfl: 3  Allergies  Allergen Reactions  . Atorvastatin Other (See Comments)    MYALGIAS    ROS  No other specific complaints in a complete review of systems (except as listed in HPI above).  Objective  Vitals:   07/19/18 1603  BP: 126/84  Pulse: 86  SpO2: 98%  Weight: 203 lb (92.1 kg)  Height: 5\' 10"  (1.778 m)    Body mass index is 29.13 kg/m.  Nursing Note and Vital Signs reviewed.  Physical Exam  Constitutional: Patient appears well-developed and well-nourished. No distress.  HEENT: head  atraumatic, normocephalic, pupils equal and reactive to light, EOM's intact,  neck supple   oropharynx pink and moist without exudate Cardiovascular: Normal rate, regular rhythm, distal pulses intact Pulmonary/Chest: Effort normal,  No respiratory distress or retractions. Musculoskeletal: Normal range of motion,  No gross deformities Neurological: He is alert and oriented to person, place, and time. No cranial nerve deficit. Coordination, balance, strength, speech and gait are normal.  Skin: Skin is warm and dry. No rash noted. Swollen erythematous abscess to tip of right thumb, non tender, non fluctuant, no streaking redness. Psychiatric: Patient has a normal mood and affect. behavior is normal. Judgment and thought content normal.    Assessment & Plan RTC for routine F/u as instructed  1. Skin infection Will treat with course of antibiotics - doxycyline sent-dosing and side effects discussed  TDAP up to date -home management, Red flags and when to present for emergency care or RTC including fever >101.69F,  new/worsening/un-resolving symptoms, reviewed with patient at time of visit. Follow up and care instructions discussed and provided in AVS. - doxycycline (VIBRA-TABS) 100 MG tablet; Take 1 tablet (100 mg total) by mouth 2 (two) times daily.  Dispense: 14 tablet; Refill: 0

## 2018-07-27 ENCOUNTER — Other Ambulatory Visit: Payer: Self-pay | Admitting: Cardiology

## 2018-07-29 ENCOUNTER — Other Ambulatory Visit: Payer: Self-pay | Admitting: Nurse Practitioner

## 2018-07-29 DIAGNOSIS — M5010 Cervical disc disorder with radiculopathy, unspecified cervical region: Secondary | ICD-10-CM

## 2018-07-30 NOTE — Telephone Encounter (Signed)
[place] Controlled Database Checked Last filled: 07/03/18 # 60 LOV w/you: 07/19/18 Next appt w/you: 08/03/18

## 2018-07-31 ENCOUNTER — Other Ambulatory Visit: Payer: Self-pay | Admitting: Nurse Practitioner

## 2018-07-31 DIAGNOSIS — M5010 Cervical disc disorder with radiculopathy, unspecified cervical region: Secondary | ICD-10-CM

## 2018-08-01 ENCOUNTER — Other Ambulatory Visit: Payer: Self-pay | Admitting: Cardiology

## 2018-08-01 DIAGNOSIS — E785 Hyperlipidemia, unspecified: Secondary | ICD-10-CM

## 2018-08-01 MED ORDER — TRAMADOL HCL 50 MG PO TABS: 50.0000 mg | ORAL_TABLET | Freq: Two times a day (BID) | ORAL | 0 refills | Status: DC

## 2018-08-01 NOTE — Telephone Encounter (Signed)
Rx request sent to pharmacy.  

## 2018-08-03 ENCOUNTER — Encounter: Payer: Self-pay | Admitting: Nurse Practitioner

## 2018-08-03 ENCOUNTER — Ambulatory Visit: Payer: Managed Care, Other (non HMO) | Admitting: Nurse Practitioner

## 2018-08-03 VITALS — BP 120/70 | HR 78 | Ht 70.0 in | Wt 203.0 lb

## 2018-08-03 DIAGNOSIS — M545 Low back pain, unspecified: Secondary | ICD-10-CM | POA: Insufficient documentation

## 2018-08-03 DIAGNOSIS — M25532 Pain in left wrist: Secondary | ICD-10-CM

## 2018-08-03 DIAGNOSIS — F419 Anxiety disorder, unspecified: Secondary | ICD-10-CM | POA: Diagnosis not present

## 2018-08-03 DIAGNOSIS — G8929 Other chronic pain: Secondary | ICD-10-CM

## 2018-08-03 DIAGNOSIS — E119 Type 2 diabetes mellitus without complications: Secondary | ICD-10-CM | POA: Insufficient documentation

## 2018-08-03 LAB — POCT GLYCOSYLATED HEMOGLOBIN (HGB A1C): Hemoglobin A1C: 5.8 % — AB (ref 4.0–5.6)

## 2018-08-03 MED ORDER — ESCITALOPRAM OXALATE 20 MG PO TABS: 20.0000 mg | ORAL_TABLET | Freq: Every day | ORAL | 1 refills | Status: DC

## 2018-08-03 MED ORDER — PREDNISONE 20 MG PO TABS: 20.0000 mg | ORAL_TABLET | Freq: Every day | ORAL | 0 refills | Status: DC

## 2018-08-03 NOTE — Assessment & Plan Note (Signed)
A1c has improved, we discussed Discussed the role of healthy diet and exercise in the management of diabetes  He is maintained on daily asa, statin Additional education provided on AVS Will plan to repeat A1c in about 6 months - POCT HgB A1C-5.8

## 2018-08-03 NOTE — Assessment & Plan Note (Signed)
We discussed trial of another SSRI vs increasing lexapro dosage and he would like to try increasing lexapro, new prescription sent We also discussed the importance of CPAP compliance for management of sleep apnea RTC in 1 month for F/U- consider switching SSRI/referral to update sleep testing if continued symptoms - escitalopram (LEXAPRO) 20 MG tablet; Take 1 tablet (20 mg total) by mouth daily.  Dispense: 30 tablet; Refill: 1

## 2018-08-03 NOTE — Assessment & Plan Note (Signed)
Short course of prednisone per patient request-dosing and side effects discussed He is going to follow up with our sports med providers for wrist pain, he can discuss back pain with them as well if he does not want to return to ortho provider, for convenience - predniSONE (DELTASONE) 20 MG tablet; Take 1 tablet (20 mg total) by mouth daily with breakfast.  Dispense: 10 tablet; Refill: 0

## 2018-08-03 NOTE — Patient Instructions (Addendum)
Please schedule a follow up appointment for further evaluation here with Dr Tamala Julian or Dr Raeford Razor, our sports medicine providers for further evaluation of wrist and back pain. I have sent a prescription for prednisone 40 mg once daily for 5 days for your back pain.  I have sent a new prescription for lexapro 20 mg once daily to your pharmacy.  Please return in about 1 month so I can see how you are doing on the new dosage of lexapro.   Diabetes Mellitus and Nutrition When you have diabetes (diabetes mellitus), it is very important to have healthy eating habits because your blood sugar (glucose) levels are greatly affected by what you eat and drink. Eating healthy foods in the appropriate amounts, at about the same times every day, can help you:  Control your blood glucose.  Lower your risk of heart disease.  Improve your blood pressure.  Reach or maintain a healthy weight.  Every person with diabetes is different, and each person has different needs for a meal plan. Your health care provider may recommend that you work with a diet and nutrition specialist (dietitian) to make a meal plan that is best for you. Your meal plan may vary depending on factors such as:  The calories you need.  The medicines you take.  Your weight.  Your blood glucose, blood pressure, and cholesterol levels.  Your activity level.  Other health conditions you have, such as heart or kidney disease.  How do carbohydrates affect me? Carbohydrates affect your blood glucose level more than any other type of food. Eating carbohydrates naturally increases the amount of glucose in your blood. Carbohydrate counting is a method for keeping track of how many carbohydrates you eat. Counting carbohydrates is important to keep your blood glucose at a healthy level, especially if you use insulin or take certain oral diabetes medicines. It is important to know how many carbohydrates you can safely have in each meal. This is  different for every person. Your dietitian can help you calculate how many carbohydrates you should have at each meal and for snack. Foods that contain carbohydrates include:  Bread, cereal, rice, pasta, and crackers.  Potatoes and corn.  Peas, beans, and lentils.  Milk and yogurt.  Fruit and juice.  Desserts, such as cakes, cookies, ice cream, and candy.  How does alcohol affect me? Alcohol can cause a sudden decrease in blood glucose (hypoglycemia), especially if you use insulin or take certain oral diabetes medicines. Hypoglycemia can be a life-threatening condition. Symptoms of hypoglycemia (sleepiness, dizziness, and confusion) are similar to symptoms of having too much alcohol. If your health care provider says that alcohol is safe for you, follow these guidelines:  Limit alcohol intake to no more than 1 drink per day for nonpregnant women and 2 drinks per day for men. One drink equals 12 oz of beer, 5 oz of wine, or 1 oz of hard liquor.  Do not drink on an empty stomach.  Keep yourself hydrated with water, diet soda, or unsweetened iced tea.  Keep in mind that regular soda, juice, and other mixers may contain a lot of sugar and must be counted as carbohydrates.  What are tips for following this plan? Reading food labels  Start by checking the serving size on the label. The amount of calories, carbohydrates, fats, and other nutrients listed on the label are based on one serving of the food. Many foods contain more than one serving per package.  Check the total grams (  g) of carbohydrates in one serving. You can calculate the number of servings of carbohydrates in one serving by dividing the total carbohydrates by 15. For example, if a food has 30 g of total carbohydrates, it would be equal to 2 servings of carbohydrates.  Check the number of grams (g) of saturated and trans fats in one serving. Choose foods that have low or no amount of these fats.  Check the number of  milligrams (mg) of sodium in one serving. Most people should limit total sodium intake to less than 2,300 mg per day.  Always check the nutrition information of foods labeled as "low-fat" or "nonfat". These foods may be higher in added sugar or refined carbohydrates and should be avoided.  Talk to your dietitian to identify your daily goals for nutrients listed on the label. Shopping  Avoid buying canned, premade, or processed foods. These foods tend to be high in fat, sodium, and added sugar.  Shop around the outside edge of the grocery store. This includes fresh fruits and vegetables, bulk grains, fresh meats, and fresh dairy. Cooking  Use low-heat cooking methods, such as baking, instead of high-heat cooking methods like deep frying.  Cook using healthy oils, such as olive, canola, or sunflower oil.  Avoid cooking with butter, cream, or high-fat meats. Meal planning  Eat meals and snacks regularly, preferably at the same times every day. Avoid going long periods of time without eating.  Eat foods high in fiber, such as fresh fruits, vegetables, beans, and whole grains. Talk to your dietitian about how many servings of carbohydrates you can eat at each meal.  Eat 4-6 ounces of lean protein each day, such as lean meat, chicken, fish, eggs, or tofu. 1 ounce is equal to 1 ounce of meat, chicken, or fish, 1 egg, or 1/4 cup of tofu.  Eat some foods each day that contain healthy fats, such as avocado, nuts, seeds, and fish. Lifestyle   Check your blood glucose regularly.  Exercise at least 30 minutes 5 or more days each week, or as told by your health care provider.  Take medicines as told by your health care provider.  Do not use any products that contain nicotine or tobacco, such as cigarettes and e-cigarettes. If you need help quitting, ask your health care provider.  Work with a Social worker or diabetes educator to identify strategies to manage stress and any emotional and social  challenges. What are some questions to ask my health care provider?  Do I need to meet with a diabetes educator?  Do I need to meet with a dietitian?  What number can I call if I have questions?  When are the best times to check my blood glucose? Where to find more information:  American Diabetes Association: diabetes.org/food-and-fitness/food  Academy of Nutrition and Dietetics: PokerClues.dk  [employer] of Diabetes and Digestive and Kidney Diseases (NIH): ContactWire.be Summary  A healthy meal plan will help you control your blood glucose and maintain a healthy lifestyle.  Working with a diet and nutrition specialist (dietitian) can help you make a meal plan that is best for you.  Keep in mind that carbohydrates and alcohol have immediate effects on your blood glucose levels. It is important to count carbohydrates and to use alcohol carefully. This information is not intended to replace advice given to you by your health care provider. Make sure you discuss any questions you have with your health care provider. Document Released: 08/04/2005 Document Revised: 12/12/2016 Document Reviewed: 12/12/2016  Chartered certified accountant Patient Education  [employer].

## 2018-08-03 NOTE — Progress Notes (Signed)
Name: Tommy May   MRN: [id number]    DOB: 09/16/1961   Date:08/03/2018       Progress Note  Subjective  Chief Complaint Follow up  HPI Tommy May is here today for routine follow up. We will review anxiety and diabetes, as well as discuss left wrist and lower back pain. When I saw him for his annual physical this year, we discussed symptoms of anxiety, and decided to start lexapro trial. He did not return until today for follow up, says he has been taking the lexapro daily since his CPE in May, the first 2 weeks he thought the lexapro made him feel bad, but reports he seems to be tolerating well now and has not noted anymore adverse effects. He thinks the lexapro is helping some, worrying a little less, but does say he continues to have some trouble sleeping at night, thinks this is related to both worry and not wearing CPAP correctly, he is planning to try to resume his CPAP.  His A1c in May was 6.5, he was instructed to work on healthy diet and exercise with plan to repeat A1c in 3-4 months. He is not interested in nutrition referral, will continue working on diet and exercise on his own at home. 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 He has also noticed worsening lower back pain over past month, does see orthopedic specialist for his chronic back pain but would really like to try steroid course before following back up with speciality.   Lab Results  Component Value Date   HGBA1C 6.5 04/09/2018    Patient Active Problem List   Diagnosis Date Noted  . Anxiety 04/16/2018  . Hyperlipidemia 11/23/2017  . Hypertension 11/23/2017  . Asymptomatic carotid artery stenosis, left 01/12/2017  . Carotid artery disease ([place]) 04/27/2016  . Encounter for general adult medical examination with abnormal findings 01/13/2016  . Tobacco abuse 08/05/2014  . Right carotid bruit  07/17/2014  . OSA (obstructive sleep apnea) 04/21/2014  . 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 ETD (eustachian tube dysfunction) 02/17/2014  . Vertigo 02/17/2014  . Cervical disc disorder with radiculopathy of cervical region 03/20/2013  . Inguinal hernia 11/21/2011  . BACK PAIN, CHRONIC, INTERMITTENT 01/30/2009    Past Surgical History:  Procedure Laterality Date  . dental implants    . ENDARTERECTOMY Left 01/12/2017   Procedure: Left Carotid ENDARTERECTOMY;  Surgeon: Conrad Troy, MD;  Location: [facility];  Service: Vascular;  Laterality: Left;  . INGUINAL HERNIA REPAIR Left 08/25/2014   Procedure: LEFT INGUINAL HERNIA REPAIR REMOVAL SPERMATIC CORD MASS;  Surgeon: Jackolyn Confer, MD;  Location: [facility];  Service: General;  Laterality: Left;  . INSERTION OF MESH N/A 08/25/2014   Procedure: INSERTION OF MESH;  Surgeon: Jackolyn Confer, MD;  Location: [facility];  Service: General;  Laterality: N/A;  . PATCH ANGIOPLASTY Left 01/12/2017   Procedure: PATCH ANGIOPLASTY;  Surgeon: Conrad [place], MD;  Location: [facility];  Service: Vascular;  Laterality: Left;  . ROTATOR CUFF REPAIR Left     Family History  Problem Relation Age of Onset  . Stroke Father 41  . Diabetes Father   . Diabetes Paternal Grandmother   . Stroke Paternal Grandfather 1       guess early 65's  . Heart attack Maternal Grandfather     Social History   Socioeconomic History  .  Marital status: Married    Spouse name: Not on file  . Number of children: 2  . Years of education: 93  . Highest education level: Not on file  Occupational History  . Occupation: Haematologist Needs  . Financial resource strain: Not on file  . Food insecurity:    Worry: Not on file    Inability: Not on file  . Transportation needs:    Medical: Not on file    Non-medical: Not on file  Tobacco Use  . Smoking status: Current Every Day Smoker    Packs/day: 0.10    Years: 43.00    Pack years: 4.30    Types: Cigarettes  . Smokeless tobacco:  Never Used  . Tobacco comment: Down to 1/2 pk per day  Substance and Sexual Activity  . Alcohol use: No  . Drug use: No  . Sexual activity: Not on file  Lifestyle  . Physical activity:    Days per week: Not on file    Minutes per session: Not on file  . Stress: Not on file  Relationships  . Social connections:    Talks on phone: Not on file    Gets together: Not on file    Attends religious service: Not on file    Active member of club or organization: Not on file    Attends meetings of clubs or organizations: Not on file    Relationship status: Not on file  . Intimate partner violence:    Fear of current or ex partner: Not on file    Emotionally abused: Not on file    Physically abused: Not on file    Forced sexual activity: Not on file  Other Topics Concern  . Not on file  Social History Narrative   Pt lives with his wife and two children.  Graduated high school.     Current Outpatient Medications:  .  acetaminophen (TYLENOL) 500 MG tablet, Take 1,000 mg by mouth every 8 (eight) hours as needed for moderate pain. , Disp: , Rfl:  .  aspirin EC 81 MG tablet, Take 1 tablet (81 mg total) by mouth daily. (Patient taking differently: Take 81 mg by mouth at bedtime. ), Disp: 90 tablet, Rfl: 3 .  Cholecalciferol (VITAMIN D-3) 5000 units TABS, Take 5,000 Units by mouth daily., Disp: , Rfl:  .  Cyanocobalamin (VITAMIN B-12) 2500 MCG SUBL, Place 5,000 mcg under the tongue daily., Disp: , Rfl:  .  escitalopram (LEXAPRO) 20 MG tablet, Take 1 tablet (20 mg total) by mouth daily., Disp: 30 tablet, Rfl: 1 .  esomeprazole (NEXIUM) 40 MG capsule, Take 40 mg by mouth daily with breakfast. , Disp: , Rfl:  .  ezetimibe (ZETIA) 10 MG tablet, Take 1 tablet (10 mg total) by mouth daily., Disp: 30 tablet, Rfl: 11 .  gabapentin (NEURONTIN) 300 MG capsule, Take 1 capsule (300 mg total) by mouth at bedtime., Disp: 90 capsule, Rfl: 2 .  loratadine (CLARITIN) 10 MG tablet, Take 10 mg by mouth daily. ,  Disp: , Rfl:  .  Multiple Vitamin (MULTIVITAMIN WITH MINERALS) TABS tablet, Take 1 tablet by mouth daily., Disp: , Rfl:  .  nicotine polacrilex (NICORETTE) 2 MG gum, Take 2 mg by mouth every 4 (four) hours as needed for smoking cessation., Disp: , Rfl:  .  rosuvastatin (CRESTOR) 40 MG tablet, TAKE 1 TABLET BY MOUTH EVERY DAY, Disp: 90 tablet, Rfl: 3 .  tamsulosin (FLOMAX) 0.4 MG CAPS capsule, Take 0.4 mg  by mouth 2 (two) times daily., Disp: , Rfl: 11 .  traMADol (ULTRAM) 50 MG tablet, Take 1 tablet (50 mg total) by mouth 2 (two) times daily., Disp: 60 tablet, Rfl: 0 .  losartan (COZAAR) 50 MG tablet, TAKE 1 TABLET (50 MG TOTAL) BY MOUTH DAILY., Disp: 90 tablet, Rfl: 3 .  predniSONE (DELTASONE) 20 MG tablet, Take 1 tablet (20 mg total) by mouth daily with breakfast., Disp: 10 tablet, Rfl: 0  Allergies  Allergen Reactions  . Atorvastatin Other (See Comments)    MYALGIAS     ROS See HPI  Objective  Vitals:   08/03/18 1324  BP: 120/70  Pulse: 78  SpO2: 95%  Weight: 203 lb (92.1 kg)  Height: 5\' 10"  (1.778 m)    Body mass index is 29.13 kg/m.  Physical Exam Vital signs reviewed. Constitutional: Patient appears well-developed and well-nourished. No distress.  HENT: Head: Normocephalic and atraumatic. Nose: Nose normal. Mouth/Throat: Oropharynx is clear and moist. No oropharyngeal exudate.  Eyes: Conjunctivae and EOM are normal. Pupils are equal, round, and reactive to light. No scleral icterus.  Neck: Normal range of motion. Neck supple.  Cardiovascular: Normal rate, regular rhythm and normal heart sounds.  No murmur heard. No BLE edema. Distal pulses intact. Pulmonary/Chest: Effort normal and breath sounds normal. No respiratory distress. Musculoskeletal: Normal range of motion,  No gross deformities Neurological: He is alert and oriented to person, place, and time. No cranial nerve deficit. Coordination, balance, strength, speech and gait are normal.  Skin: Skin is warm and dry.  No rash noted. No erythema.  Psychiatric: Patient has a normal mood and affect. behavior is normal. Judgment and thought content normal.   Assessment & Plan RTC in 1 month for F/U: Anxiety-increasing lexapro dosage , resuming CPAP  Left wrist pain Discussed referral to sports med in our clinic for further evaluation and management and he is agreeable

## 2018-08-09 ENCOUNTER — Encounter: Payer: Self-pay | Admitting: Family Medicine

## 2018-08-09 ENCOUNTER — Ambulatory Visit (INDEPENDENT_AMBULATORY_CARE_PROVIDER_SITE_OTHER)
Admission: RE | Admit: 2018-08-09 | Discharge: 2018-08-09 | Disposition: A | Discharge status: Home / Self Care | Payer: Managed Care, Other (non HMO) | Source: Ambulatory Visit | Attending: Family Medicine | Admitting: Family Medicine

## 2018-08-09 ENCOUNTER — Ambulatory Visit: Payer: Managed Care, Other (non HMO) | Admitting: Family Medicine

## 2018-08-09 VITALS — BP 142/76 | HR 99 | Ht 70.0 in | Wt 203.0 lb

## 2018-08-09 DIAGNOSIS — M25532 Pain in left wrist: Secondary | ICD-10-CM

## 2018-08-09 MED ORDER — DICLOFENAC SODIUM 2 % TD SOLN: 1.0000 "application " | Freq: Two times a day (BID) | TRANSDERMAL | 3 refills | Status: DC

## 2018-08-09 NOTE — Patient Instructions (Signed)
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

## 2018-08-09 NOTE — Progress Notes (Signed)
Tommy May - 57 y.o. male MRN [id number]  Date of birth: November 11, 1961  SUBJECTIVE:  Including CC & ROS.  Chief Complaint  Patient presents with  . Wrist Pain    Tommy May is a 57 y.o. male that is presenting with left wrist pain. Ongoing for one month. Pain and tingling in his left hand while driving over the past month, drives for work and often lifting heavy boxes. Pain is located at the radial aspect of his left wrist. Mild to severe when he rotates his wrist.  Feels like the sensation is mainly localized to the volar aspect and radial aspect of his wrist.  No inciting event.  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    Review of Systems  Constitutional: Negative for fever.  HENT: Negative for congestion.   Respiratory: Negative for cough.   Cardiovascular: Negative for chest pain.  Gastrointestinal: Negative for abdominal pain.  Musculoskeletal: Negative for back pain.  Skin: Negative for color change.  Neurological: Negative for weakness.  Hematological: Negative for adenopathy.  Psychiatric/Behavioral: Negative for agitation.    HISTORY: Past Medical, Surgical, Social, and Family History Reviewed & Updated per EMR.   Pertinent Historical Findings include:  Past Medical History:  Diagnosis Date  . Allergy   . Carotid artery occlusion   . Environmental and seasonal allergies   . GERD (gastroesophageal reflux disease)   . Heart palpitations   . History of Holter monitoring   . Hyperlipidemia   . Hypertension    does not take meds  . Nerve pain    neck; takes gabapentin  . Sleep apnea    wears CPAP nightly    Past Surgical History:  Procedure Laterality Date  . dental implants    . ENDARTERECTOMY Left 01/12/2017   Procedure: Left Carotid ENDARTERECTOMY;  Surgeon: Conrad Seacliff, MD;  Location: [place];  Service: Vascular;  Laterality: Left;  . INGUINAL HERNIA REPAIR Left 08/25/2014   Procedure: LEFT INGUINAL HERNIA REPAIR REMOVAL  SPERMATIC CORD MASS;  Surgeon: Jackolyn Confer, MD;  Location: [facility];  Service: General;  Laterality: Left;  . INSERTION OF MESH N/A 08/25/2014   Procedure: INSERTION OF MESH;  Surgeon: Jackolyn Confer, MD;  Location: [facility];  Service: General;  Laterality: N/A;  . PATCH ANGIOPLASTY Left 01/12/2017   Procedure: PATCH ANGIOPLASTY;  Surgeon: Conrad Valhalla, MD;  Location: [facility];  Service: Vascular;  Laterality: Left;  . ROTATOR CUFF REPAIR Left     Allergies  Allergen Reactions  . Atorvastatin Other (See Comments)    MYALGIAS    Family History  Problem Relation Age of Onset  . Stroke Father 16  . Diabetes Father   . Diabetes Paternal Grandmother   . Stroke Paternal Grandfather 58       guess early 7's  . Heart attack Maternal Grandfather      Social History   Socioeconomic History  . Marital status: Married    Spouse name: Not on file  . Number of children: 2  . Years of education: 50  . Highest education level: Not on file  Occupational History  . Occupation: Haematologist Needs  . Financial resource strain: Not on file  . Food insecurity:    Worry: Not on file    Inability: Not on file  . Transportation needs:    Medical: Not on file    Non-medical: Not on file  Tobacco Use  . Smoking status:  Current Every Day Smoker    Packs/day: 0.10    Years: 43.00    Pack years: 4.30    Types: Cigarettes  . Smokeless tobacco: Never Used  . Tobacco comment: Down to 1/2 pk per day  Substance and Sexual Activity  . Alcohol use: No  . Drug use: No  . Sexual activity: Not on file  Lifestyle  . Physical activity:    Days per week: Not on file    Minutes per session: Not on file  . Stress: Not on file  Relationships  . Social connections:    Talks on phone: Not on file    Gets together: Not on file    Attends religious service: Not on file    Active member of club or organization: Not on file    Attends meetings of clubs or organizations: Not on file    Relationship  status: Not on file  . Intimate partner violence:    Fear of current or ex partner: Not on file    Emotionally abused: Not on file    Physically abused: Not on file    Forced sexual activity: Not on file  Other Topics Concern  . Not on file  Social History Narrative   Pt lives with his wife and two children.  Graduated high school.     PHYSICAL EXAM:  VS: BP (!) 142/76   Pulse 99   Ht 5\' 10"  (1.778 m)   Wt 203 lb (92.1 kg)   SpO2 98%   BMI 29.13 kg/m  Physical Exam Gen: NAD, alert, cooperative with exam, well-appearing ENT: normal lips, normal nasal mucosa,  Eye: normal EOM, normal conjunctiva and lids CV:  no edema, +2 pedal pulses   Resp: no accessory muscle use, non-labored,  Skin: no rashes, no areas of induration  Neuro: normal tone, normal sensation to touch Psych:  normal insight, alert and oriented MSK:  Left wrist: Some tenderness to palpation over the first dorsal compartment. No tenderness palpation over the scaphoid. No weakness with some resistance to extension. Normal pincer grasp. Normal strength resistance with finger abduction and abduction. Normal wrist range of motion. Normal grip strength. Neurovascular intact  Limited ultrasound: Left wrist:  Normal-appearing first and second dorsal compartment. Normal-appearing MP joint. Arthritic changes of the [facility] joint appreciated. No scapholunate instability on dynamic testing.   Summary: CMC joint arthritis.  Ultrasound and interpretation by Clearance Coots, MD        ASSESSMENT & PLAN:   Left wrist pain It seems that his pain is associated with de Quervain's but ultrasound does not demonstrate any changes on exam. -Pennsaid provided. -X-ray. -If no improvement may need to consider injection or use of a splint.

## 2018-08-10 ENCOUNTER — Other Ambulatory Visit: Payer: Self-pay | Admitting: Nurse Practitioner

## 2018-08-10 DIAGNOSIS — M5010 Cervical disc disorder with radiculopathy, unspecified cervical region: Secondary | ICD-10-CM

## 2018-08-12 DIAGNOSIS — M19039 Primary osteoarthritis, unspecified wrist: Secondary | ICD-10-CM | POA: Insufficient documentation

## 2018-08-12 NOTE — Assessment & Plan Note (Signed)
It seems that his pain is associated with de Quervain's but ultrasound does not demonstrate any changes on exam. -Pennsaid provided. -X-ray. -If no improvement may need to consider injection or use of a splint.

## 2018-08-13 ENCOUNTER — Telehealth: Payer: Self-pay | Admitting: Family Medicine

## 2018-08-13 NOTE — Telephone Encounter (Signed)
Spoke with patient about xrays.   Rosemarie Ax, MD [facility] 08/13/2018, 10:55 AM

## 2018-08-27 ENCOUNTER — Other Ambulatory Visit: Payer: Self-pay | Admitting: Nurse Practitioner

## 2018-08-27 DIAGNOSIS — M5010 Cervical disc disorder with radiculopathy, unspecified cervical region: Secondary | ICD-10-CM

## 2018-08-27 MED ORDER — TRAMADOL HCL 50 MG PO TABS: 50.0000 mg | ORAL_TABLET | Freq: Two times a day (BID) | ORAL | 0 refills | Status: DC

## 2018-08-27 NOTE — Telephone Encounter (Signed)
[place] Controlled Database Checked Last filled: 08/01/18 # 60 LOV w/you: 08/03/18 Next appt w/you: 09/03/18

## 2018-09-03 ENCOUNTER — Ambulatory Visit: Payer: Managed Care, Other (non HMO) | Admitting: Nurse Practitioner

## 2018-09-03 ENCOUNTER — Encounter: Payer: Self-pay | Admitting: Nurse Practitioner

## 2018-09-03 ENCOUNTER — Other Ambulatory Visit: Payer: Self-pay | Admitting: Nurse Practitioner

## 2018-09-03 VITALS — BP 130/80 | HR 74 | Ht 70.0 in | Wt 204.0 lb

## 2018-09-03 DIAGNOSIS — F419 Anxiety disorder, unspecified: Secondary | ICD-10-CM

## 2018-09-03 NOTE — Progress Notes (Signed)
Tommy May is a 57 y.o. male with the following history as recorded in Epic[employer]:  Patient Active Problem List   Diagnosis Date Noted  . Left wrist pain 08/12/2018  . Chronic low back pain 08/03/2018  . Type 2 diabetes mellitus without complication, without long-term current use of insulin ([place]) 08/03/2018  . Anxiety 04/16/2018  . Hyperlipidemia 11/23/2017  . Hypertension 11/23/2017  . Asymptomatic carotid artery stenosis, left 01/12/2017  . Carotid artery disease ([place]) 04/27/2016  . Encounter for general adult medical examination with abnormal findings 01/13/2016  . Tobacco abuse 08/05/2014  . Right carotid bruit 07/17/2014  . OSA (obstructive sleep apnea) 04/21/2014  . 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 ETD (eustachian tube dysfunction) 02/17/2014  . Vertigo 02/17/2014  . Cervical disc disorder with radiculopathy of cervical region 03/20/2013  . Inguinal hernia 11/21/2011  . BACK PAIN, CHRONIC, INTERMITTENT 01/30/2009    Current Outpatient Medications  Medication Sig Dispense Refill  . acetaminophen (TYLENOL) 500 MG tablet Take 1,000 mg by mouth every 8 (eight) hours as needed for moderate pain.     Marland Kitchen aspirin EC 81 MG tablet Take 1 tablet (81 mg total) by mouth daily. (Patient taking differently: Take 81 mg by mouth at bedtime. ) 90 tablet 3  . Cholecalciferol (VITAMIN D-3) 5000 units TABS Take 5,000 Units by mouth daily.    . Cyanocobalamin (VITAMIN B-12) 2500 MCG SUBL Place 5,000 mcg under the tongue daily.    Marland Kitchen escitalopram (LEXAPRO) 20 MG tablet Take 1 tablet (20 mg total) by mouth daily. 30 tablet 1  . esomeprazole (NEXIUM) 40 MG capsule Take 40 mg by mouth daily with breakfast.     . ezetimibe (ZETIA) 10 MG tablet Take 1 tablet (10 mg total) by mouth daily. 30 tablet 11  . gabapentin (NEURONTIN) 300 MG capsule TAKE 1 CAPSULE BY MOUTH EVERYDAY AT BEDTIME 90 capsule 2  . loratadine (CLARITIN) 10 MG tablet Take 10 mg by mouth daily.     . Multiple Vitamin (MULTIVITAMIN  WITH MINERALS) TABS tablet Take 1 tablet by mouth daily.    . nicotine polacrilex (NICORETTE) 2 MG gum Take 2 mg by mouth every 4 (four) hours as needed for smoking cessation.    . rosuvastatin (CRESTOR) 40 MG tablet TAKE 1 TABLET BY MOUTH EVERY DAY 90 tablet 3  . tamsulosin (FLOMAX) 0.4 MG CAPS capsule Take 0.4 mg by mouth 2 (two) times daily.  11  . traMADol (ULTRAM) 50 MG tablet Take 1 tablet (50 mg total) by mouth 2 (two) times daily. 60 tablet 0  . Diclofenac Sodium (PENNSAID) 2 % SOLN Place 1 application onto the skin 2 (two) times daily. (Patient not taking: Reported on 09/03/2018) 1 Bottle 3  . losartan (COZAAR) 50 MG tablet TAKE 1 TABLET (50 MG TOTAL) BY MOUTH DAILY. 90 tablet 3   No current facility-administered medications for this visit.     Allergies: Atorvastatin  Past Medical History:  Diagnosis Date  . Allergy   . Carotid artery occlusion   . Environmental and seasonal allergies   . GERD (gastroesophageal reflux disease)   . Heart palpitations   . History of Holter monitoring   . Hyperlipidemia   . Hypertension    does not take meds  . Nerve pain    neck; takes gabapentin  . Sleep apnea    wears CPAP nightly    Past Surgical History:  Procedure Laterality Date  . dental implants    . ENDARTERECTOMY Left 01/12/2017  Procedure: Left Carotid ENDARTERECTOMY;  Surgeon: Conrad Fox Chase, MD;  Location: [place];  Service: Vascular;  Laterality: Left;  . INGUINAL HERNIA REPAIR Left 08/25/2014   Procedure: LEFT INGUINAL HERNIA REPAIR REMOVAL SPERMATIC CORD MASS;  Surgeon: Jackolyn Confer, MD;  Location: [place];  Service: General;  Laterality: Left;  . INSERTION OF MESH N/A 08/25/2014   Procedure: INSERTION OF MESH;  Surgeon: Jackolyn Confer, MD;  Location: [place];  Service: General;  Laterality: N/A;  . PATCH ANGIOPLASTY Left 01/12/2017   Procedure: PATCH ANGIOPLASTY;  Surgeon: Conrad Durango, MD;  Location: [place];  Service: Vascular;  Laterality: Left;  . ROTATOR CUFF REPAIR Left      Family History  Problem Relation Age of Onset  . Stroke Father 95  . Diabetes Father   . Diabetes Paternal Grandmother   . Stroke Paternal Grandfather 43       guess early 71's  . Heart attack Maternal Grandfather     Social History   Tobacco Use  . Smoking status: Current Every Day Smoker    Packs/day: 0.10    Years: 43.00    Pack years: 4.30    Types: Cigarettes  . Smokeless tobacco: Never Used  . Tobacco comment: Down to 1/2 pk per day  Substance Use Topics  . Alcohol use: No     Subjective:   Tommy May is here today for follow up of anxiety and insomnia- increased lexapro dosage to 20 daily at last OV on 08/03/18 due to continued worry and trouble sleeping,. He has increased dosage to 20 daily, at first did notice some upset stomach but now seems to be tolerating the new dosage well, overall worry has improved, states even his wife has noticed improvement, not anxious/worrying during the day, but continues to have trouble sleeping due to worrying, often hard to fall asleep or wakes up worrying. Does use TV/phone before bed. At his last visit on 9/13 he told me he was not regularly wearing CPAP, he says he has resumed this daily as instructed and is tolerating well. Denies depression, thoughts of hurting himself or others. Has tried melatonin in the past for sleep but it didn't seem to help  ROS- See HPI  Objective:  Vitals:   09/03/18 1559  BP: 130/80  Pulse: 74  SpO2: 96%  Weight: 204 lb (92.5 kg)  Height: 5\' 10"  (1.778 m)    General: Well developed, well nourished, in no acute distress  Skin : Warm and dry.  Head: Normocephalic and atraumatic  Eyes: Sclera and conjunctiva clear; pupils round and reactive to light; extraocular movements intact  Oropharynx: Pink, supple.  Neck: Supple  Lungs: Respirations unlabored; clear to auscultation bilaterally without wheeze, rales, rhonchi  CVS exam: normal rate, regular rhythm, normal S1, S2, no murmurs, rubs, clicks or  gallops.  Extremities: No edema, cyanosis Vessels: Symmetric bilaterally  Neurologic: Alert and oriented; speech intact; face symmetrical; moves all extremities well; CNII-XII intact without focal deficit   Assessment:  1. Anxiety     Plan:   Return in about 6 months (around 03/05/2019) for follow up- anxiety, insomnia, DM- update A1c.  No orders of the defined types were placed in this encounter.   Requested Prescriptions    No prescriptions requested or ordered in this encounter

## 2018-09-03 NOTE — Patient Instructions (Signed)
Please try techniques for sleep as we discussed.  Follow up if no improvement or if you start feeling worse  Insomnia Insomnia is a sleep disorder that makes it difficult to fall asleep or to stay asleep. Insomnia can cause tiredness (fatigue), low energy, difficulty concentrating, mood swings, and poor performance at work or school. There are three different ways to classify insomnia:  Difficulty falling asleep.  Difficulty staying asleep.  Waking up too early in the morning.  Any type of insomnia can be long-term (chronic) or short-term (acute). Both are common. Short-term insomnia usually lasts for three months or less. Chronic insomnia occurs at least three times a week for longer than three months. What are the causes? Insomnia may be caused by another condition, situation, or substance, such as:  Anxiety.  Certain medicines.  Gastroesophageal reflux disease (GERD) or other gastrointestinal conditions.  Asthma or other breathing conditions.  Restless legs syndrome, sleep apnea, or other sleep disorders.  Chronic pain.  Menopause. This may include hot flashes.  Stroke.  Abuse of alcohol, tobacco, or illegal drugs.  Depression.  Caffeine.  Neurological disorders, such as Alzheimer disease.  An overactive thyroid (hyperthyroidism).  The cause of insomnia may not be known. What increases the risk? Risk factors for insomnia include:  Gender. Women are more commonly affected than men.  Age. Insomnia is more common as you get older.  Stress. This may involve your professional or personal life.  Income. Insomnia is more common in people with lower income.  Lack of exercise.  Irregular work schedule or night shifts.  Traveling between different time zones.  What are the signs or symptoms? If you have insomnia, trouble falling asleep or trouble staying asleep is the main symptom. This may lead to other symptoms, such as:  Feeling fatigued.  Feeling  nervous about going to sleep.  Not feeling rested in the morning.  Having trouble concentrating.  Feeling irritable, anxious, or depressed.  How is this treated? Treatment for insomnia depends on the cause. If your insomnia is caused by an underlying condition, treatment will focus on addressing the condition. Treatment may also include:  Medicines to help you sleep.  Counseling or therapy.  Lifestyle adjustments.  Follow these instructions at home:  Take medicines only as directed by your health care provider.  Keep regular sleeping and waking hours. Avoid naps.  Keep a sleep diary to help you and your health care provider figure out what could be causing your insomnia. Include: ? When you sleep. ? When you wake up during the night. ? How well you sleep. ? How rested you feel the next day. ? Any side effects of medicines you are taking. ? What you eat and drink.  Make your bedroom a comfortable place where it is easy to fall asleep: ? Put up shades or special blackout curtains to block light from outside. ? Use a white noise machine to block noise. ? Keep the temperature cool.  Exercise regularly as directed by your health care provider. Avoid exercising right before bedtime.  Use relaxation techniques to manage stress. Ask your health care provider to suggest some techniques that may work well for you. These may include: ? Breathing exercises. ? Routines to release muscle tension. ? Visualizing peaceful scenes.  Cut back on alcohol, caffeinated beverages, and cigarettes, especially close to bedtime. These can disrupt your sleep.  Do not overeat or eat spicy foods right before bedtime. This can lead to digestive discomfort that can make it hard  for you to sleep.  Limit screen use before bedtime. This includes: ? Watching TV. ? Using your smartphone, tablet, and computer.  Stick to a routine. This can help you fall asleep faster. Try to do a quiet activity, brush  your teeth, and go to bed at the same time each night.  Get out of bed if you are still awake after 15 minutes of trying to sleep. Keep the lights down, but try reading or doing a quiet activity. When you feel sleepy, go back to bed.  Make sure that you drive carefully. Avoid driving if you feel very sleepy.  Keep all follow-up appointments as directed by your health care provider. This is important. Contact a health care provider if:  You are tired throughout the day or have trouble in your daily routine due to sleepiness.  You continue to have sleep problems or your sleep problems get worse. Get help right away if:  You have serious thoughts about hurting yourself or someone else. This information is not intended to replace advice given to you by your health care provider. Make sure you discuss any questions you have with your health care provider. Document Released: 11/04/2000 Document Revised: 04/08/2016 Document Reviewed: 08/08/2014 Elsevier Interactive Patient Education  [employer].

## 2018-09-07 ENCOUNTER — Encounter: Payer: Self-pay | Admitting: Nurse Practitioner

## 2018-09-07 NOTE — Assessment & Plan Note (Signed)
Suspect his sleep disturbance is related to worry Lexapro seems to be helping aside from nighttime worry so we will continue this for now, this nighttime worry has been ongoing for him sometime and it may be best to work on healthy sleep practices prior to additional medication adjustments. We did discuss this together along with sleep hygiene, Home management, red flags and return precautions including when to seek immediate care discussed and printed on AVS RTC in 6 months for F/U

## 2018-09-21 ENCOUNTER — Other Ambulatory Visit: Payer: Self-pay | Admitting: Nurse Practitioner

## 2018-09-21 DIAGNOSIS — F419 Anxiety disorder, unspecified: Secondary | ICD-10-CM

## 2018-09-24 ENCOUNTER — Other Ambulatory Visit: Payer: Self-pay | Admitting: Nurse Practitioner

## 2018-09-24 DIAGNOSIS — M5010 Cervical disc disorder with radiculopathy, unspecified cervical region: Secondary | ICD-10-CM

## 2018-09-24 NOTE — Telephone Encounter (Signed)
Check [place] registry last filled 08/29/2018../[email or web address]  

## 2018-09-25 ENCOUNTER — Other Ambulatory Visit: Payer: Self-pay | Admitting: Nurse Practitioner

## 2018-09-25 DIAGNOSIS — M5010 Cervical disc disorder with radiculopathy, unspecified cervical region: Secondary | ICD-10-CM

## 2018-09-26 ENCOUNTER — Encounter: Payer: Self-pay | Admitting: Nurse Practitioner

## 2018-09-26 NOTE — Telephone Encounter (Signed)
[place] Controlled Database Checked Last filled: 08/29/18 # 60 LOV w/you: 09/03/18  Next appt w/you: None

## 2018-10-16 ENCOUNTER — Other Ambulatory Visit: Payer: Self-pay | Admitting: Nurse Practitioner

## 2018-10-16 DIAGNOSIS — F419 Anxiety disorder, unspecified: Secondary | ICD-10-CM

## 2018-10-22 ENCOUNTER — Other Ambulatory Visit: Payer: Self-pay | Admitting: Nurse Practitioner

## 2018-10-22 DIAGNOSIS — M5010 Cervical disc disorder with radiculopathy, unspecified cervical region: Secondary | ICD-10-CM

## 2018-10-22 MED ORDER — TRAMADOL HCL 50 MG PO TABS: 50.0000 mg | ORAL_TABLET | Freq: Two times a day (BID) | ORAL | 0 refills | Status: DC

## 2018-10-22 NOTE — Telephone Encounter (Signed)
Check  registry last filled 09/26/2018.Marland KitchenJohny Chess

## 2018-11-06 ENCOUNTER — Other Ambulatory Visit: Payer: Self-pay | Admitting: Cardiology

## 2018-11-19 ENCOUNTER — Other Ambulatory Visit: Payer: Self-pay | Admitting: Nurse Practitioner

## 2018-11-19 DIAGNOSIS — M5010 Cervical disc disorder with radiculopathy, unspecified cervical region: Secondary | ICD-10-CM

## 2018-11-20 NOTE — Telephone Encounter (Signed)
[place] Controlled Database Checked Last filled: 10/25/18 # 60 LOV w/PCP: 09/03/18 Next appt w/PCP: None

## 2018-11-21 ENCOUNTER — Other Ambulatory Visit: Payer: Self-pay | Admitting: Nurse Practitioner

## 2018-11-21 DIAGNOSIS — M5010 Cervical disc disorder with radiculopathy, unspecified cervical region: Secondary | ICD-10-CM

## 2018-11-22 ENCOUNTER — Encounter: Payer: Self-pay | Admitting: Nurse Practitioner

## 2018-11-22 NOTE — Telephone Encounter (Signed)
[place] Controlled Database Checked Last filled:10/25/18 # 60 LOV w/you: 09/03/18 Next appt w/you: None

## 2018-11-30 ENCOUNTER — Ambulatory Visit: Payer: Managed Care, Other (non HMO) | Admitting: Family Medicine

## 2018-11-30 ENCOUNTER — Ambulatory Visit: Payer: Self-pay

## 2018-11-30 VITALS — BP 134/74 | HR 68 | Resp 16 | Ht 70.0 in | Wt 212.0 lb

## 2018-11-30 DIAGNOSIS — M19039 Primary osteoarthritis, unspecified wrist: Secondary | ICD-10-CM

## 2018-11-30 MED ORDER — IBUPROFEN-FAMOTIDINE 800-26.6 MG PO TABS: 1.0000 | ORAL_TABLET | Freq: Three times a day (TID) | ORAL | 3 refills | Status: DC

## 2018-11-30 MED ORDER — PREDNISONE 5 MG PO TABS: ORAL_TABLET | ORAL | 0 refills | Status: DC

## 2018-11-30 NOTE — Assessment & Plan Note (Signed)
His pain seems to be an exacerbation of underlying arthritis of the carpal bones.  Does not appear to have de Quervain's or pain related to the [facility] joint. -Prednisone. -Duexis. -Provided thumb spica splint. -If no improvement will consider injection and imaging.

## 2018-11-30 NOTE — Patient Instructions (Signed)
Good to see you  Please try the thumb spica splint for two weeks  Please try the duexis after the prednisone  Please see me back in 3-4 weeks if no better.

## 2018-11-30 NOTE — Progress Notes (Signed)
Tommy May - 58 y.o. male MRN [id number]  Date of birth: 02-24-1961  SUBJECTIVE:  Including CC & ROS.  No chief complaint on file.   Tommy May is a 58 y.o. male that is  Presenting with acute on chronic radial sided wrist pain. The pain is localized to this aspect of the wrist. He felt the pain worse after reaching up on his truck. Pain has been intermittent. No ecchymosis. Having some swelling. He is left handed. No numbness or tingling. Pain is moderate to severe. Pain has been present for about a week.   Review of Systems  Constitutional: Negative for fever.  HENT: Negative for congestion.   Respiratory: Negative for cough.   Cardiovascular: Negative for chest pain.  Gastrointestinal: Negative for abdominal pain.  Musculoskeletal: Positive for arthralgias.  Skin: Negative for color change.  Neurological: Negative for weakness.  Hematological: Negative for adenopathy.  Psychiatric/Behavioral: Negative for agitation.    HISTORY: Past Medical, Surgical, Social, and Family History Reviewed & Updated per EMR.   Pertinent Historical Findings include:  Past Medical History:  Diagnosis Date  . Allergy   . Carotid artery occlusion   . Environmental and seasonal allergies   . GERD (gastroesophageal reflux disease)   . Heart palpitations   . History of Holter monitoring   . Hyperlipidemia   . Hypertension    does not take meds  . Nerve pain    neck; takes gabapentin  . Sleep apnea    wears CPAP nightly    Past Surgical History:  Procedure Laterality Date  . dental implants    . ENDARTERECTOMY Left 01/12/2017   Procedure: Left Carotid ENDARTERECTOMY;  Surgeon: Conrad [place], MD;  Location: [place];  Service: Vascular;  Laterality: Left;  . INGUINAL HERNIA REPAIR Left 08/25/2014   Procedure: LEFT INGUINAL HERNIA REPAIR REMOVAL SPERMATIC CORD MASS;  Surgeon: Jackolyn Confer, MD;  Location: [place];  Service: General;  Laterality: Left;  . INSERTION OF MESH N/A 08/25/2014   Procedure: INSERTION OF MESH;  Surgeon: Jackolyn Confer, MD;  Location: [place];  Service: General;  Laterality: N/A;  . PATCH ANGIOPLASTY Left 01/12/2017   Procedure: PATCH ANGIOPLASTY;  Surgeon: Conrad Winona, MD;  Location: [place];  Service: Vascular;  Laterality: Left;  . ROTATOR CUFF REPAIR Left     Allergies  Allergen Reactions  . Atorvastatin Other (See Comments)    MYALGIAS    Family History  Problem Relation Age of Onset  . Stroke Father 69  . Diabetes Father   . Diabetes Paternal Grandmother   . Stroke Paternal Grandfather 4       guess early 25's  . Heart attack Maternal Grandfather      Social History   Socioeconomic History  . Marital status: Married    Spouse name: Not on file  . Number of children: 2  . Years of education: 54  . Highest education level: Not on file  Occupational History  . Occupation: Haematologist Needs  . Financial resource strain: Not on file  . Food insecurity:    Worry: Not on file    Inability: Not on file  . Transportation needs:    Medical: Not on file    Non-medical: Not on file  Tobacco Use  . Smoking status: Current Every Day Smoker    Packs/day: 0.10    Years: 43.00    Pack years: 4.30    Types: Cigarettes  . Smokeless tobacco: Never Used  .  Tobacco comment: Down to 1/2 pk per day  Substance and Sexual Activity  . Alcohol use: No  . Drug use: No  . Sexual activity: Not on file  Lifestyle  . Physical activity:    Days per week: Not on file    Minutes per session: Not on file  . Stress: Not on file  Relationships  . Social connections:    Talks on phone: Not on file    Gets together: Not on file    Attends religious service: Not on file    Active member of club or organization: Not on file    Attends meetings of clubs or organizations: Not on file    Relationship status: Not on file  . Intimate partner violence:    Fear of current or ex partner: Not on file    Emotionally abused: Not on file    Physically  abused: Not on file    Forced sexual activity: Not on file  Other Topics Concern  . Not on file  Social History Narrative   Pt lives with his wife and two children.  Graduated high school.     PHYSICAL EXAM:  VS: BP 134/74   Pulse 68   Resp 16   Ht 5\' 10"  (1.778 m)   Wt 212 lb (96.2 kg)   SpO2 98%   BMI 30.42 kg/m  Physical Exam Gen: NAD, alert, cooperative with exam, well-appearing ENT: normal lips, normal nasal mucosa,  Eye: normal EOM, normal conjunctiva and lids CV:  no edema, +2 pedal pulses   Resp: no accessory muscle use, non-labored,  Skin: no rashes, no areas of induration  Neuro: normal tone, normal sensation to touch Psych:  normal insight, alert and oriented MSK:  Left wrist:  Normal ROM  Normal strength to resistance  Some pain with radial deviation  Normal grip strength  Normal strength to resistance with thumb hyperextension  Negative Finkelstein's test Neurovascularly intact    Limited ultrasound: left wrist:  Normal appearing 1st dorsal compartment  Normal appearing distal radius  scaphotrapeizotrapezoidal joint appears to have significant degenerative changes and effusion   Summary: moderate degenerative changes of the radial sided carpal joints with effusion   Ultrasound and interpretation by Clearance Coots, MD    ASSESSMENT & PLAN:   Wrist arthritis His pain seems to be an exacerbation of underlying arthritis of the carpal bones.  Does not appear to have de Quervain's or pain related to the [facility] joint. -Prednisone. -Duexis. -Provided thumb spica splint. -If no improvement will consider injection and imaging.

## 2018-12-09 ENCOUNTER — Encounter: Payer: Self-pay | Admitting: Nurse Practitioner

## 2018-12-10 ENCOUNTER — Encounter: Payer: Self-pay | Admitting: Nurse Practitioner

## 2018-12-10 ENCOUNTER — Ambulatory Visit (INDEPENDENT_AMBULATORY_CARE_PROVIDER_SITE_OTHER)
Admission: RE | Admit: 2018-12-10 | Discharge: 2018-12-10 | Disposition: A | Discharge status: Home / Self Care | Payer: Managed Care, Other (non HMO) | Source: Ambulatory Visit | Attending: Nurse Practitioner | Admitting: Nurse Practitioner

## 2018-12-10 ENCOUNTER — Ambulatory Visit: Payer: Managed Care, Other (non HMO) | Admitting: Nurse Practitioner

## 2018-12-10 VITALS — BP 140/84 | HR 76 | Ht 70.0 in | Wt 207.0 lb

## 2018-12-10 DIAGNOSIS — S6992XA Unspecified injury of left wrist, hand and finger(s), initial encounter: Secondary | ICD-10-CM

## 2018-12-10 NOTE — Patient Instructions (Signed)
Head downstairs for xray of your wrist  Keep planned follow up with sports medicine   Wrist Pain, Adult There are many things that can cause wrist pain. Some common causes include:  An injury to the wrist area.  Overuse of the joint.  A condition that causes too much pressure to be put on a nerve in the wrist (carpal tunnel syndrome).  Wear and tear of the joints that happens as a person gets older (osteoarthritis).  Other types of arthritis. Sometimes, the cause of wrist pain is not known. Often, the pain goes away when you follow your doctor's instructions for helping pain at home, such as resting or icing your wrist. If your wrist pain does not go away, it is important to tell your doctor. Follow these instructions at home:  Rest the wrist area for 48 hours or more, or as long as told by your doctor.  If a splint or elastic bandage has been put on your wrist, use it as told by your doctor. ? Take off the splint or bandage only as told by your doctor. ? Loosen the splint or bandage if your fingers tingle, lose feeling (get numb), or turn cold or blue.  If directed, apply ice to the injured area: ? If you have a removable splint or elastic bandage, remove it as told by your doctor. ? Put ice in a plastic bag. ? Place a towel between your skin and the bag or between your splint or bandage and the bag. ? Leave the ice on for 20 minutes, 2-3 times a day.   Keep your arm raised (elevated) above the level of your heart while you are sitting or lying down.  Take over-the-counter and prescription medicines only as told by your doctor.  Keep all follow-up visits as told by your doctor. This is important. Contact a doctor if:  You have a sudden sharp pain in the wrist, hand, or arm that is different or new.  The swelling or bruising on your wrist or hand gets worse.  Your skin becomes red, gets a rash, or has open sores.  Your pain does not get better or it gets worse. Get help  right away if:  You lose feeling in your fingers or hand.  Your fingers turn white, very red, or cold and blue.  You cannot move your fingers.  You have a fever or chills. This information is not intended to replace advice given to you by your health care provider. Make sure you discuss any questions you have with your health care provider. Document Released: 04/25/2008 Document Revised: 06/02/2016 Document Reviewed: 05/26/2016 Elsevier Interactive Patient Education  2019 [employer].

## 2018-12-10 NOTE — Progress Notes (Signed)
Tommy May is a 58 y.o. male with the following history as recorded in EpicCare:  Patient Active Problem List   Diagnosis Date Noted  . Wrist arthritis 08/12/2018  . Chronic low back pain 08/03/2018  . Type 2 diabetes mellitus without complication, without long-term current use of insulin ([place]) 08/03/2018  . Anxiety 04/16/2018  . Hyperlipidemia 11/23/2017  . Hypertension 11/23/2017  . Asymptomatic carotid artery stenosis, left 01/12/2017  . Carotid artery disease ([place]) 04/27/2016  . Encounter for general adult medical examination with abnormal findings 01/13/2016  . Tobacco abuse 08/05/2014  . Right carotid bruit 07/17/2014  . OSA (obstructive sleep apnea) 04/21/2014  . 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 ETD (eustachian tube dysfunction) 02/17/2014  . Vertigo 02/17/2014  . Cervical disc disorder with radiculopathy of cervical region 03/20/2013  . Inguinal hernia 11/21/2011  . BACK PAIN, CHRONIC, INTERMITTENT 01/30/2009    Current Outpatient Medications  Medication Sig Dispense Refill  . acetaminophen (TYLENOL) 500 MG tablet Take 1,000 mg by mouth every 8 (eight) hours as needed for moderate pain.     Marland Kitchen aspirin EC 81 MG tablet Take 1 tablet (81 mg total) by mouth daily. (Patient taking differently: Take 81 mg by mouth at bedtime. ) 90 tablet 3  . Cholecalciferol (VITAMIN D-3) 5000 units TABS Take 5,000 Units by mouth daily.    . Cyanocobalamin (VITAMIN B-12) 2500 MCG SUBL Place 5,000 mcg under the tongue daily.    . Diclofenac Sodium (PENNSAID) 2 % SOLN Place 1 application onto the skin 2 (two) times daily. 1 Bottle 3  . escitalopram (LEXAPRO) 20 MG tablet TAKE 1 TABLET BY MOUTH EVERY DAY 90 tablet 0  . esomeprazole (NEXIUM) 40 MG capsule Take 40 mg by mouth daily with breakfast.     . ezetimibe (ZETIA) 10 MG tablet Take 1 tablet (10 mg total) by mouth daily. 30 tablet 11  . gabapentin (NEURONTIN) 300 MG capsule TAKE 1 CAPSULE BY MOUTH EVERYDAY AT BEDTIME 90 capsule 2  .  Ibuprofen-Famotidine 800-26.6 MG TABS Take 1 tablet by mouth 3 (three) times daily. 90 tablet 3  . loratadine (CLARITIN) 10 MG tablet Take 10 mg by mouth daily.     Marland Kitchen losartan (COZAAR) 50 MG tablet TAKE 1 TABLET BY MOUTH EVERY DAY 90 tablet 2  . Multiple Vitamin (MULTIVITAMIN WITH MINERALS) TABS tablet Take 1 tablet by mouth daily.    . nicotine polacrilex (NICORETTE) 2 MG gum Take 2 mg by mouth every 4 (four) hours as needed for smoking cessation.    . rosuvastatin (CRESTOR) 40 MG tablet TAKE 1 TABLET BY MOUTH EVERY DAY 90 tablet 3  . tamsulosin (FLOMAX) 0.4 MG CAPS capsule Take 0.4 mg by mouth 2 (two) times daily.  11  . traMADol (ULTRAM) 50 MG tablet TAKE 1 TABLET BY MOUTH TWICE A DAY *10-25-18* 60 tablet 0   No current facility-administered medications for this visit.     Allergies: Atorvastatin  Past Medical History:  Diagnosis Date  . Allergy   . Carotid artery occlusion   . Environmental and seasonal allergies   . GERD (gastroesophageal reflux disease)   . Heart palpitations   . History of Holter monitoring   . Hyperlipidemia   . Hypertension    does not take meds  . Nerve pain    neck; takes gabapentin  . Sleep apnea    wears CPAP nightly    Past Surgical History:  Procedure Laterality Date  . dental implants    . ENDARTERECTOMY  Left 01/12/2017   Procedure: Left Carotid ENDARTERECTOMY;  Surgeon: Conrad [place], MD;  Location: [place];  Service: Vascular;  Laterality: Left;  . INGUINAL HERNIA REPAIR Left 08/25/2014   Procedure: LEFT INGUINAL HERNIA REPAIR REMOVAL SPERMATIC CORD MASS;  Surgeon: Jackolyn Confer, MD;  Location: [place];  Service: General;  Laterality: Left;  . INSERTION OF MESH N/A 08/25/2014   Procedure: INSERTION OF MESH;  Surgeon: Jackolyn Confer, MD;  Location: [place];  Service: General;  Laterality: N/A;  . PATCH ANGIOPLASTY Left 01/12/2017   Procedure: PATCH ANGIOPLASTY;  Surgeon: Conrad , MD;  Location: [place];  Service: Vascular;  Laterality: Left;  .  ROTATOR CUFF REPAIR Left     Family History  Problem Relation Age of Onset  . Stroke Father 35  . Diabetes Father   . Diabetes Paternal Grandmother   . Stroke Paternal Grandfather 22       guess early 71's  . Heart attack Maternal Grandfather     Social History   Tobacco Use  . Smoking status: Current Every Day Smoker    Packs/day: 0.10    Years: 43.00    Pack years: 4.30    Types: Cigarettes  . Smokeless tobacco: Never Used  . Tobacco comment: Down to 1/2 pk per day  Substance Use Topics  . Alcohol use: No     Subjective:  Mr Schoneman is here today requesting evaluation of acute complaint of left wrist pain, chronic for him but increased in severity since playing tug with his dog this past Friday. He has been following over past several months with sports medicine for left wrist pain with diagnosis of arthritis, last saw [facility] provider on 11/30/2018 and given duexis Rx, steroid course, splint, he has not received duexis yet but has been taking ibuprofen PRN instead and completed steroid course with no noticeable improvement in his pain. Then this past Friday he was playing with his dog when his left wrist was "bent backwards". He felt a "tearing crunching" pain in his wrist at this time, then noticed increased swelling, and pain in the wrist as well as bruise to anterior wrist which have persisted since. 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 Ecchymosis left anterior wrist. Head: Normocephalic and atraumatic  Eyes: Sclera and conjunctiva clear; pupils round and  reactive to light; extraocular movements intact  Oropharynx: Pink, supple. No suspicious lesions  Neck: Supple. Lungs: Effort unlabored, no respiratory distress CVS exam: Normal rate and regular rhythm.  Musculoskeletal: No deformities; no active joint inflammation     Left wrist: He exhibits decreased range of motion, tenderness and swelling. He exhibits no bony tenderness and no deformity.  Extremities: No edema, cyanosis, clubbing  Vessels: Symmetric bilaterally  Neurologic: Alert and oriented; speech intact; face symmetrical; moves all extremities well; CNII-XII intact without focal deficit  Psychiatric: Normal mood and affect. Assessment:  1. Injury of left wrist, initial encounter     Plan:   Due to new injury, will update  xray Home management, red flags and return precautions including when to seek immediate care discussed and printed on AVS F/U with further recommendations pending xray results  No follow-ups on file.  Orders Placed This Encounter  Procedures  . DG Wrist Complete Left    Standing Status:   Future    Number of Occurrences:   1    Standing Expiration Date:   02/08/2020    Order Specific Question:   Reason for Exam (SYMPTOM  OR DIAGNOSIS REQUIRED)    Answer:   pain, injury, swelling, bruising    Order Specific Question:   Preferred imaging location?    Answer:   [facility]    Order Specific Question:   Radiology Contrast Protocol - do NOT remove file path    Answer:   [email or web address]    Requested Prescriptions    No prescriptions requested or ordered in this encounter

## 2018-12-11 ENCOUNTER — Encounter: Payer: Self-pay | Admitting: Family Medicine

## 2018-12-11 ENCOUNTER — Ambulatory Visit (INDEPENDENT_AMBULATORY_CARE_PROVIDER_SITE_OTHER): Payer: Managed Care, Other (non HMO)

## 2018-12-11 ENCOUNTER — Ambulatory Visit: Payer: Managed Care, Other (non HMO) | Admitting: Family Medicine

## 2018-12-11 ENCOUNTER — Encounter: Payer: Self-pay | Admitting: Nurse Practitioner

## 2018-12-11 VITALS — BP 138/70 | HR 92 | Temp 98.2°F | Ht 70.0 in | Wt 207.2 lb

## 2018-12-11 DIAGNOSIS — S66819A Strain of other specified muscles, fascia and tendons at wrist and hand level, unspecified hand, initial encounter: Secondary | ICD-10-CM | POA: Diagnosis not present

## 2018-12-11 DIAGNOSIS — M25532 Pain in left wrist: Secondary | ICD-10-CM | POA: Diagnosis not present

## 2018-12-11 NOTE — Patient Instructions (Signed)
Good to see you  Please continue ice  Please use the splint at all time for the time being  It appears that you have a partial tear of the flexor pollicus longus

## 2018-12-11 NOTE — Progress Notes (Signed)
Tommy May - 58 y.o. male MRN [id number]  Date of birth: [birth year]  SUBJECTIVE:  Including CC & ROS.  Chief Complaint  Patient presents with  . Wrist Pain    left wrist pain/ bruising/ reinjured playing with dog/ used ice and iburpofen    Tommy May is a 58 y.o. male that is presenting with acute volar sided and radial sided wrist pain.  He was playing with his dog on Friday and had an acute hyperextension of his wrist.  Since that time he has had swelling and ecchymosis over the volar radial aspect of the wrist.  Pain is intermittent.  Pain is worse when he has to flex his wrist or if he is trying to put his hand in his pants pocket.  Has had improvement with swelling with icing.  Has tried some ibuprofen.  This pain feels different compared to the other pain that he has been experiencing.  Denies any loss of motion but does have pain with certain movements.  Localized to the wrist.  Pain is sharp.  Independent review of the left wrist x-ray from 1/20 shows degenerative changes of the scaphoid trapezoid joint.   Review of Systems  Constitutional: Negative for fever.  HENT: Negative for congestion.   Respiratory: Negative for cough.   Cardiovascular: Negative for chest pain.  Gastrointestinal: Negative for abdominal pain.  Musculoskeletal: Positive for arthralgias.  Skin: Positive for color change.  Neurological: Negative for weakness.  Hematological: Negative for adenopathy.  Psychiatric/Behavioral: Negative for agitation.    HISTORY: Past Medical, Surgical, Social, and Family History Reviewed & Updated per EMR.   Pertinent Historical Findings include:  Past Medical History:  Diagnosis Date  . Allergy   . Carotid artery occlusion   . Environmental and seasonal allergies   . GERD (gastroesophageal reflux disease)   . Heart palpitations   . History of Holter monitoring   . Hyperlipidemia   . Hypertension    does not take meds  . Nerve pain    neck; takes gabapentin   . Sleep apnea    wears CPAP nightly    Past Surgical History:  Procedure Laterality Date  . dental implants    . ENDARTERECTOMY Left 01/12/2017   Procedure: Left Carotid ENDARTERECTOMY;  Surgeon: Conrad [place], MD;  Location: [place];  Service: Vascular;  Laterality: Left;  . INGUINAL HERNIA REPAIR Left 08/25/2014   Procedure: LEFT INGUINAL HERNIA REPAIR REMOVAL SPERMATIC CORD MASS;  Surgeon: Jackolyn Confer, MD;  Location: [place];  Service: General;  Laterality: Left;  . INSERTION OF MESH N/A 08/25/2014   Procedure: INSERTION OF MESH;  Surgeon: Jackolyn Confer, MD;  Location: [place];  Service: General;  Laterality: N/A;  . PATCH ANGIOPLASTY Left 01/12/2017   Procedure: PATCH ANGIOPLASTY;  Surgeon: Conrad Bodfish, MD;  Location: [place];  Service: Vascular;  Laterality: Left;  . ROTATOR CUFF REPAIR Left     Allergies  Allergen Reactions  . Atorvastatin Other (See Comments)    MYALGIAS    Family History  Problem Relation Age of Onset  . Stroke Father 76  . Diabetes Father   . Diabetes Paternal Grandmother   . Stroke Paternal Grandfather 60       guess early 16's  . Heart attack Maternal Grandfather      Social History   Socioeconomic History  . Marital status: Married    Spouse name: Not on file  . Number of children: 2  . Years of education: 74  .  Highest education level: Not on file  Occupational History  . Occupation: Haematologist Needs  . Financial resource strain: Not on file  . Food insecurity:    Worry: Not on file    Inability: Not on file  . Transportation needs:    Medical: Not on file    Non-medical: Not on file  Tobacco Use  . Smoking status: Current Every Day Smoker    Packs/day: 0.10    Years: 43.00    Pack years: 4.30    Types: Cigarettes  . Smokeless tobacco: Never Used  . Tobacco comment: Down to 1/2 pk per day  Substance and Sexual Activity  . Alcohol use: No  . Drug use: No  . Sexual activity: Not on file  Lifestyle  . Physical activity:      Days per week: Not on file    Minutes per session: Not on file  . Stress: Not on file  Relationships  . Social connections:    Talks on phone: Not on file    Gets together: Not on file    Attends religious service: Not on file    Active member of club or organization: Not on file    Attends meetings of clubs or organizations: Not on file    Relationship status: Not on file  . Intimate partner violence:    Fear of current or ex partner: Not on file    Emotionally abused: Not on file    Physically abused: Not on file    Forced sexual activity: Not on file  Other Topics Concern  . Not on file  Social History Narrative   Pt lives with his wife and two children.  Graduated high school.     PHYSICAL EXAM:  VS: BP 138/70   Pulse 92   Temp 98.2 F (36.8 C) (Oral)   Ht 5\' 10"  (1.778 m)   Wt 207 lb 3.2 oz (94 kg)   SpO2 98%   BMI 29.73 kg/m  Physical Exam Gen: NAD, alert, cooperative with exam, well-appearing ENT: normal lips, normal nasal mucosa,  Eye: normal EOM, normal conjunctiva and lids CV:  no edema, +2 pedal pulses   Resp: no accessory muscle use, non-labored,  Skin: no rashes, no areas of induration  Neuro: normal tone, normal sensation to touch Psych:  normal insight, alert and oriented MSK:  Left wrist: Swelling and mild ecchymosis over the distal radius and radial side of the carpal bones Normal range of motion of the thumb and strength resistance. Pain with flexion and radial deviation of the wrist. Normal grip strength. Neurovascular intact  Limited ultrasound: Left wrist:  CMC with mild effusion The scaphoid and trapezoid joint with degenerative changes and mild effusion. Normal-appearing first dorsal compartment. Flexor pollicis longus appears to be distal raid as it enters into the hand.  Do not appreciate a complete tear but likely a partial tear.  Proximally the tendon is normal.  Summary: Findings suggest a partial tear of the flexor pollicis  longus  Ultrasound and interpretation by Clearance Coots, MD      ASSESSMENT & PLAN:   Strain of flexor pollicis longus tendon This appears to be more of an issue with the tendon as opposed to the ongoing issues that he had with his wrist that were more likely associated with arthritis.  Ultrasound was revealing for what seems to be a partial tear.  Has good range of motion and no lack of strength of the thumb.   -Has  Duexis at home to take for pain. -Counseled on supportive care. -Counseled on treatment and will place him in an exos thumb spica splint  -He will follow-up in 1 to 2 weeks.  If any loss of strength then may need to consider MRI to evaluate tear

## 2018-12-12 ENCOUNTER — Ambulatory Visit: Payer: Managed Care, Other (non HMO) | Admitting: Nurse Practitioner

## 2018-12-12 DIAGNOSIS — S66819A Strain of other specified muscles, fascia and tendons at wrist and hand level, unspecified hand, initial encounter: Secondary | ICD-10-CM | POA: Insufficient documentation

## 2018-12-12 NOTE — Assessment & Plan Note (Addendum)
This appears to be more of an issue with the tendon as opposed to the ongoing issues that he had with his wrist that were more likely associated with arthritis.  Ultrasound was revealing for what seems to be a partial tear.  Has good range of motion and no lack of strength of the thumb.   -Has Duexis at home to take for pain. -Counseled on supportive care. -Counseled on treatment and will place him in an exos thumb spica splint  -He will follow-up in 1 to 2 weeks.  If any loss of strength then may need to consider MRI to evaluate tear

## 2018-12-19 ENCOUNTER — Other Ambulatory Visit: Payer: Self-pay | Admitting: Nurse Practitioner

## 2018-12-19 DIAGNOSIS — M5010 Cervical disc disorder with radiculopathy, unspecified cervical region: Secondary | ICD-10-CM

## 2018-12-19 NOTE — Telephone Encounter (Signed)
Can you see if he has updated pain contract on file? I wrote in my notes that he had one on 2019, now I do not see a contract in media tab, need to find out if this should be updated

## 2018-12-19 NOTE — Telephone Encounter (Signed)
I can't find a recent pain contract.

## 2018-12-20 ENCOUNTER — Other Ambulatory Visit: Payer: Self-pay | Admitting: Nurse Practitioner

## 2018-12-20 DIAGNOSIS — M5010 Cervical disc disorder with radiculopathy, unspecified cervical region: Secondary | ICD-10-CM

## 2018-12-21 ENCOUNTER — Encounter: Payer: Self-pay | Admitting: Nurse Practitioner

## 2018-12-21 NOTE — Telephone Encounter (Signed)
[place] Controlled Database Checked Last filled: 11/23/18 # 58 LOV w/you: 12/10/18 Next appt w/you: none

## 2019-01-07 ENCOUNTER — Other Ambulatory Visit: Payer: Self-pay | Admitting: *Deleted

## 2019-01-07 DIAGNOSIS — F419 Anxiety disorder, unspecified: Secondary | ICD-10-CM

## 2019-01-08 ENCOUNTER — Other Ambulatory Visit: Payer: Self-pay | Admitting: *Deleted

## 2019-01-08 MED ORDER — LOSARTAN POTASSIUM 100 MG PO TABS: ORAL_TABLET | ORAL | 3 refills | Status: DC

## 2019-01-08 NOTE — Telephone Encounter (Signed)
Patient requested refill of his Losartan 50 mg. Per [facility] 50 mg not available will change to 100 mg 1/2 tablet daily per patient request

## 2019-01-10 ENCOUNTER — Other Ambulatory Visit: Payer: Self-pay | Admitting: *Deleted

## 2019-01-10 DIAGNOSIS — F419 Anxiety disorder, unspecified: Secondary | ICD-10-CM

## 2019-01-10 MED ORDER — ESCITALOPRAM OXALATE 20 MG PO TABS: 20.0000 mg | ORAL_TABLET | Freq: Every day | ORAL | 1 refills | Status: DC

## 2019-01-20 ENCOUNTER — Other Ambulatory Visit: Payer: Self-pay | Admitting: Nurse Practitioner

## 2019-01-20 DIAGNOSIS — M5010 Cervical disc disorder with radiculopathy, unspecified cervical region: Secondary | ICD-10-CM

## 2019-01-21 NOTE — Telephone Encounter (Signed)
[place] Controlled Database Checked Last filled: 12/23/18 # 58 LOV w/you: 12/10/18 Next appt w/you: None

## 2019-02-10 ENCOUNTER — Encounter: Payer: Self-pay | Admitting: Nurse Practitioner

## 2019-02-11 ENCOUNTER — Other Ambulatory Visit: Payer: Self-pay | Admitting: Nurse Practitioner

## 2019-02-11 DIAGNOSIS — M5010 Cervical disc disorder with radiculopathy, unspecified cervical region: Secondary | ICD-10-CM

## 2019-02-11 MED ORDER — TRAMADOL HCL 50 MG PO TABS: ORAL_TABLET | ORAL | 0 refills | Status: DC

## 2019-03-20 ENCOUNTER — Other Ambulatory Visit: Payer: Self-pay | Admitting: *Deleted

## 2019-03-20 DIAGNOSIS — M5010 Cervical disc disorder with radiculopathy, unspecified cervical region: Secondary | ICD-10-CM

## 2019-03-20 MED ORDER — TRAMADOL HCL 50 MG PO TABS: ORAL_TABLET | ORAL | 0 refills | Status: DC

## 2019-03-20 NOTE — Telephone Encounter (Signed)
[place] Controlled Database Checked Last filled: 02/20/19 # 58 LOV w/PCP: 12/10/18 Next appt: None

## 2019-03-20 NOTE — Telephone Encounter (Signed)
Schedule him with [facility] for 2 months if he is going to stay here- needs CPE; His last message with [facility] indicated he might be moving his care elsewhere?

## 2019-04-01 ENCOUNTER — Other Ambulatory Visit: Payer: Self-pay | Admitting: Internal Medicine

## 2019-04-01 DIAGNOSIS — Z0000 Encounter for general adult medical examination without abnormal findings: Secondary | ICD-10-CM

## 2019-04-01 DIAGNOSIS — E119 Type 2 diabetes mellitus without complications: Secondary | ICD-10-CM

## 2019-04-01 NOTE — Telephone Encounter (Signed)
Patient called back and states he wants to continue his care here at [facility].  Dr. Jenny Reichmann, Can I schedule him a CPE w/you in June or July?

## 2019-04-01 NOTE — Telephone Encounter (Signed)
See 02/10/19 patient message. Called pt to confirm if he is staying here at [facility] for primary care OR establishing with a new office. No answer/unable to leave vm.Closing phone note.

## 2019-04-01 NOTE — Telephone Encounter (Signed)
Pt has a refill on file that was to be filled on Mar 22, 2019, should not need refilled at this time  OK for CPX in June 2020 with labs prior (I will order)

## 2019-04-03 NOTE — Telephone Encounter (Signed)
Called pt to inform of below. No answer/unabe to leave vm. CRM created in case patient calls back.

## 2019-04-08 ENCOUNTER — Other Ambulatory Visit: Payer: Self-pay | Admitting: *Deleted

## 2019-04-08 DIAGNOSIS — M5010 Cervical disc disorder with radiculopathy, unspecified cervical region: Secondary | ICD-10-CM

## 2019-04-08 MED ORDER — GABAPENTIN 300 MG PO CAPS: ORAL_CAPSULE | ORAL | 0 refills | Status: DC

## 2019-04-18 ENCOUNTER — Other Ambulatory Visit: Payer: Self-pay | Admitting: Family

## 2019-04-18 DIAGNOSIS — M5010 Cervical disc disorder with radiculopathy, unspecified cervical region: Secondary | ICD-10-CM

## 2019-04-19 NOTE — Telephone Encounter (Signed)
Done erx 

## 2019-05-09 ENCOUNTER — Other Ambulatory Visit (INDEPENDENT_AMBULATORY_CARE_PROVIDER_SITE_OTHER): Payer: Managed Care, Other (non HMO)

## 2019-05-09 DIAGNOSIS — Z0000 Encounter for general adult medical examination without abnormal findings: Secondary | ICD-10-CM

## 2019-05-09 DIAGNOSIS — E119 Type 2 diabetes mellitus without complications: Secondary | ICD-10-CM

## 2019-05-09 DIAGNOSIS — Z125 Encounter for screening for malignant neoplasm of prostate: Secondary | ICD-10-CM | POA: Diagnosis not present

## 2019-05-09 LAB — LIPID PANEL
Cholesterol: 161 mg/dL (ref 0–200)
HDL: 47.1 mg/dL (ref >39.00)
NonHDL: 114.21
Total CHOL/HDL Ratio: 3
Triglycerides: 274 mg/dL — ABNORMAL HIGH (ref 0.0–149.0)
VLDL: 54.8 mg/dL — ABNORMAL HIGH (ref 0.0–40.0)

## 2019-05-09 LAB — CBC WITH DIFFERENTIAL/PLATELET
Basophils Absolute: 0 10*3/uL (ref 0.0–0.1)
Basophils Relative: 0.2 % (ref 0.0–3.0)
Eosinophils Absolute: 0.1 10*3/uL (ref 0.0–0.7)
Eosinophils Relative: 2 % (ref 0.0–5.0)
HCT: 48.5 % (ref 39.0–52.0)
Hemoglobin: 16.7 g/dL (ref 13.0–17.0)
Lymphocytes Relative: 38 % (ref 12.0–46.0)
Lymphs Abs: 2.5 10*3/uL (ref 0.7–4.0)
MCHC: 34.5 g/dL (ref 30.0–36.0)
MCV: 89 fl (ref 78.0–100.0)
Monocytes Absolute: 0.7 10*3/uL (ref 0.1–1.0)
Monocytes Relative: 10.7 % (ref 3.0–12.0)
Neutro Abs: 3.2 10*3/uL (ref 1.4–7.7)
Neutrophils Relative %: 49.1 % (ref 43.0–77.0)
Platelets: 148 10*3/uL — ABNORMAL LOW (ref 150.0–400.0)
RBC: 5.46 Mil/uL (ref 4.22–5.81)
RDW: 12.6 % (ref 11.5–15.5)
WBC: 6.4 10*3/uL (ref 4.0–10.5)

## 2019-05-09 LAB — PSA: PSA: 0.3 ng/mL (ref 0.10–4.00)

## 2019-05-09 LAB — URINALYSIS, ROUTINE W REFLEX MICROSCOPIC
Bilirubin Urine: NEGATIVE
Hgb urine dipstick: NEGATIVE
Leukocytes,Ua: NEGATIVE
Nitrite: NEGATIVE
Specific Gravity, Urine: 1.025 (ref 1.000–1.030)
Total Protein, Urine: NEGATIVE
Urine Glucose: NEGATIVE
Urobilinogen, UA: 0.2 (ref 0.0–1.0)
pH: 6 (ref 5.0–8.0)

## 2019-05-09 LAB — HEPATIC FUNCTION PANEL
ALT: 31 U/L (ref 0–53)
AST: 20 U/L (ref 0–37)
Albumin: 3.5 g/dL (ref 3.5–5.2)
Alkaline Phosphatase: 52 U/L (ref 39–117)
Bilirubin, Direct: 0.2 mg/dL (ref 0.0–0.3)
Total Bilirubin: 0.9 mg/dL (ref 0.2–1.2)
Total Protein: 5.5 g/dL — ABNORMAL LOW (ref 6.0–8.3)

## 2019-05-09 LAB — HEMOGLOBIN A1C: Hgb A1c MFr Bld: 6.7 % — ABNORMAL HIGH (ref 4.6–6.5)

## 2019-05-09 LAB — MICROALBUMIN / CREATININE URINE RATIO
Creatinine,U: 197.3 mg/dL
Microalb Creat Ratio: 0.6 mg/g (ref 0.0–30.0)
Microalb, Ur: 1.3 mg/dL (ref 0.0–1.9)

## 2019-05-09 LAB — BASIC METABOLIC PANEL
BUN: 9 mg/dL (ref 6–23)
CO2: 26 mEq/L (ref 19–32)
Calcium: 8.8 mg/dL (ref 8.4–10.5)
Chloride: 104 mEq/L (ref 96–112)
Creatinine, Ser: 0.78 mg/dL (ref 0.40–1.50)
GFR: 101.47 mL/min (ref >60.00)
Glucose, Bld: 171 mg/dL — ABNORMAL HIGH (ref 70–99)
Potassium: 4.5 mEq/L (ref 3.5–5.1)
Sodium: 139 mEq/L (ref 135–145)

## 2019-05-09 LAB — LDL CHOLESTEROL, DIRECT: Direct LDL: 88 mg/dL

## 2019-05-09 LAB — TSH: TSH: 1.6 u[IU]/mL (ref 0.35–4.50)

## 2019-05-16 ENCOUNTER — Encounter: Payer: Self-pay | Admitting: Internal Medicine

## 2019-05-16 ENCOUNTER — Ambulatory Visit (INDEPENDENT_AMBULATORY_CARE_PROVIDER_SITE_OTHER): Payer: Managed Care, Other (non HMO) | Admitting: Internal Medicine

## 2019-05-16 ENCOUNTER — Other Ambulatory Visit: Payer: Self-pay

## 2019-05-16 VITALS — BP 116/66 | HR 84 | Temp 98.2°F | Ht 70.0 in | Wt 207.0 lb

## 2019-05-16 DIAGNOSIS — E538 Deficiency of other specified B group vitamins: Secondary | ICD-10-CM | POA: Diagnosis not present

## 2019-05-16 DIAGNOSIS — E611 Iron deficiency: Secondary | ICD-10-CM

## 2019-05-16 DIAGNOSIS — Z114 Encounter for screening for human immunodeficiency virus [HIV]: Secondary | ICD-10-CM

## 2019-05-16 DIAGNOSIS — E119 Type 2 diabetes mellitus without complications: Secondary | ICD-10-CM

## 2019-05-16 DIAGNOSIS — Z0000 Encounter for general adult medical examination without abnormal findings: Secondary | ICD-10-CM | POA: Diagnosis not present

## 2019-05-16 DIAGNOSIS — E559 Vitamin D deficiency, unspecified: Secondary | ICD-10-CM

## 2019-05-16 MED ORDER — LANCETS MISC: 3 refills | Status: DC

## 2019-05-16 MED ORDER — ONETOUCH ULTRA VI STRP: ORAL_STRIP | 12 refills | Status: DC

## 2019-05-16 MED ORDER — ONETOUCH ULTRA 2 W/DEVICE KIT: PACK | 0 refills | Status: DC

## 2019-05-16 MED ORDER — METFORMIN HCL 500 MG PO TABS: 500.0000 mg | ORAL_TABLET | Freq: Every day | ORAL | 3 refills | Status: DC

## 2019-05-16 NOTE — Patient Instructions (Signed)
Please take all new medication as prescribed - the metformin  Your glucometer and supplies are also sent  Please continue all other medications as before, and refills have been done if requested.  Please have the pharmacy call with any other refills you may need.  Please continue your efforts at being more active, low cholesterol diet, and weight control.  You are otherwise up to date with prevention measures today.  Please keep your appointments with your specialists as you may have planned  Please return in 6 months, or sooner if needed, with Lab testing done 3-5 days before

## 2019-05-16 NOTE — Progress Notes (Signed)
Subjective:    Patient ID: Tommy May, male    DOB: [birth year], 58 y.o.   MRN: [id number]  HPI  Here for wellness and f/u;  Overall doing ok;  Pt denies Chest pain, worsening SOB, DOE, wheezing, orthopnea, PND, worsening LE edema, palpitations, dizziness or syncope.  Pt denies neurological change such as new headache, facial or extremity weakness.  Pt denies polydipsia, polyuria, or low sugar symptoms. Pt states overall good compliance with treatment and medications, good tolerability, and has been trying to follow appropriate diet.  Pt denies worsening depressive symptoms, suicidal ideation or panic. No fever, night sweats, wt loss, loss of appetite, or other constitutional symptoms.  Pt states good ability with ADL's, has low fall risk, home safety reviewed and adequate, no other significant changes in hearing or vision, and only occasionally active with exercise.  More stress recently, might consider family counseling.  Plans to call for his cologuard fo/u soon, needs to check with insurance.  Plans to call eye doctor appt.  Needs glucometer and supplies Past Medical History:  Diagnosis Date  . Allergy   . Carotid artery occlusion   . Environmental and seasonal allergies   . GERD (gastroesophageal reflux disease)   . Heart palpitations   . History of Holter monitoring   . Hyperlipidemia   . Hypertension    does not take meds  . Nerve pain    neck; takes gabapentin  . Sleep apnea    wears CPAP nightly   Past Surgical History:  Procedure Laterality Date  . dental implants    . ENDARTERECTOMY Left 01/12/2017   Procedure: Left Carotid ENDARTERECTOMY;  Surgeon: Conrad Orient, MD;  Location: [place];  Service: Vascular;  Laterality: Left;  . INGUINAL HERNIA REPAIR Left 08/25/2014   Procedure: LEFT INGUINAL HERNIA REPAIR REMOVAL SPERMATIC CORD MASS;  Surgeon: Jackolyn Confer, MD;  Location: [place];  Service: General;  Laterality: Left;  . INSERTION OF MESH N/A 08/25/2014   Procedure:  INSERTION OF MESH;  Surgeon: Jackolyn Confer, MD;  Location: [place];  Service: General;  Laterality: N/A;  . PATCH ANGIOPLASTY Left 01/12/2017   Procedure: PATCH ANGIOPLASTY;  Surgeon: Conrad [place], MD;  Location: [place];  Service: Vascular;  Laterality: Left;  . ROTATOR CUFF REPAIR Left     reports that he has been smoking cigarettes. He has a 4.30 pack-year smoking history. He has never used smokeless tobacco. He reports that he does not drink alcohol or use drugs. family history includes Diabetes in his father and paternal grandmother; Heart attack in his maternal grandfather; Stroke (age of onset: 11) in his paternal grandfather; Stroke (age of onset: 34) in his father. Allergies  Allergen Reactions  . Atorvastatin Other (See Comments)    MYALGIAS   Current Outpatient Medications on File Prior to Visit  Medication Sig Dispense Refill  . acetaminophen (TYLENOL) 500 MG tablet Take 1,000 mg by mouth every 8 (eight) hours as needed for moderate pain.     Marland Kitchen aspirin EC 81 MG tablet Take 1 tablet (81 mg total) by mouth daily. (Patient taking differently: Take 81 mg by mouth at bedtime. ) 90 tablet 3  . Cholecalciferol (VITAMIN D-3) 5000 units TABS Take 5,000 Units by mouth daily.    . Cyanocobalamin (VITAMIN B-12) 2500 MCG SUBL Place 5,000 mcg under the tongue daily.    Marland Kitchen escitalopram (LEXAPRO) 20 MG tablet Take 1 tablet (20 mg total) by mouth daily. 90 tablet 1  . esomeprazole ([place])  40 MG capsule Take 40 mg by mouth daily with breakfast.     . ezetimibe (ZETIA) 10 MG tablet Take 1 tablet (10 mg total) by mouth daily. 30 tablet 11  . gabapentin (NEURONTIN) 300 MG capsule TAKE 1 CAPSULE BY MOUTH EVERYDAY AT BEDTIME.  KEEP 05/16/19 OFFICE VISIT. 90 capsule 0  . loratadine (CLARITIN) 10 MG tablet Take 10 mg by mouth daily.     Marland Kitchen losartan (COZAAR) 100 MG tablet TAKE 1/2 TABLET BY MOUTH DAILY 45 tablet 3  . Multiple Vitamin (MULTIVITAMIN WITH MINERALS) TABS tablet Take 1 tablet by mouth daily.    .  nicotine polacrilex (NICORETTE) 2 MG gum Take 2 mg by mouth every 4 (four) hours as needed for smoking cessation.    . rosuvastatin (CRESTOR) 40 MG tablet TAKE 1 TABLET BY MOUTH EVERY DAY 90 tablet 3  . tamsulosin (FLOMAX) 0.4 MG CAPS capsule Take 0.4 mg by mouth 2 (two) times daily.  11  . traMADol (ULTRAM) 50 MG tablet TAKE 1 TABLET BY MOUTH TWICE A DAY 60 tablet 0   No current facility-administered medications on file prior to visit.    Review of Systems Constitutional: Negative for other unusual diaphoresis, sweats, appetite or weight changes HENT: Negative for other worsening hearing loss, ear pain, facial swelling, mouth sores or neck stiffness.   Eyes: Negative for other worsening pain, redness or other visual disturbance.  Respiratory: Negative for other stridor or swelling Cardiovascular: Negative for other palpitations or other chest pain  Gastrointestinal: Negative for worsening diarrhea or loose stools, blood in stool, distention or other pain Genitourinary: Negative for hematuria, flank pain or other change in urine volume.  Musculoskeletal: Negative for myalgias or other joint swelling.  Skin: Negative for other color change, or other wound or worsening drainage.  Neurological: Negative for other syncope or numbness. Hematological: Negative for other adenopathy or swelling Psychiatric/Behavioral: Negative for hallucinations, other worsening agitation, SI, self-injury, or new decreased concentration\ All other system neg per pt    Objective:   Physical Exam BP 116/66   Pulse 84   Temp 98.2 F (36.8 C) (Oral)   Ht 5\' 10"  (1.778 m)   Wt 207 lb (93.9 kg)   SpO2 97%   BMI 29.70 kg/m  VS noted,  Constitutional: Pt is oriented to person, place, and time. Appears well-developed and well-nourished, in no significant distress and comfortable Head: Normocephalic and atraumatic  Eyes: Conjunctivae and EOM are normal. Pupils are equal, round, and reactive to light Right Ear:  External ear normal without discharge Left Ear: External ear normal without discharge Nose: Nose without discharge or deformity Mouth/Throat: Oropharynx is without other ulcerations and moist  Neck: Normal range of motion. Neck supple. No JVD present. No tracheal deviation present or significant neck LA or mass Cardiovascular: Normal rate, regular rhythm, normal heart sounds and intact distal pulses.   Pulmonary/Chest: WOB normal and breath sounds without rales or wheezing  Abdominal: Soft. Bowel sounds are normal. NT. No HSM  Musculoskeletal: Normal range of motion. Exhibits no edema Lymphadenopathy: Has no other cervical adenopathy.  Neurological: Pt is alert and oriented to person, place, and time. Pt has normal reflexes. No cranial nerve deficit. Motor grossly intact, Gait intact Skin: Skin is warm and dry. No rash noted or new ulcerations Psychiatric:  Has normal mood and affect. Behavior is normal without agitation No other exam findings Lab Results  Component Value Date   WBC 6.4 05/09/2019   HGB 16.7 05/09/2019   HCT  48.5 05/09/2019   PLT 148.0 (L) 05/09/2019   GLUCOSE 171 (H) 05/09/2019   CHOL 161 05/09/2019   TRIG 274.0 (H) 05/09/2019   HDL 47.10 05/09/2019   LDLDIRECT 88.0 05/09/2019   LDLCALC 125 09/08/2016   ALT 31 05/09/2019   AST 20 05/09/2019   NA 139 05/09/2019   K 4.5 05/09/2019   CL 104 05/09/2019   CREATININE 0.78 05/09/2019   BUN 9 05/09/2019   CO2 26 05/09/2019   TSH 1.60 05/09/2019   PSA 0.30 05/09/2019   INR 0.91 01/06/2017   HGBA1C 6.7 (H) 05/09/2019   MICROALBUR 1.3 05/09/2019      Assessment & Plan:

## 2019-05-17 ENCOUNTER — Other Ambulatory Visit: Payer: Self-pay | Admitting: Internal Medicine

## 2019-05-17 MED ORDER — ONETOUCH VERIO W/DEVICE KIT: 1.0000 | PACK | Freq: Every day | 0 refills | Status: AC

## 2019-05-17 MED ORDER — ONETOUCH VERIO VI STRP: ORAL_STRIP | 12 refills | Status: DC

## 2019-05-19 ENCOUNTER — Encounter: Payer: Self-pay | Admitting: Internal Medicine

## 2019-05-19 NOTE — Assessment & Plan Note (Signed)

## 2019-05-19 NOTE — Assessment & Plan Note (Addendum)
Mild uncontrolled, to start metformin asd, o/w stable overall by history and exam, recent data reviewed with pt, and pt to continue medical treatment as before,  to f/u any worsening symptoms or concerns, for a1c with labs

## 2019-06-25 ENCOUNTER — Other Ambulatory Visit: Payer: Self-pay | Admitting: Internal Medicine

## 2019-06-25 DIAGNOSIS — M5010 Cervical disc disorder with radiculopathy, unspecified cervical region: Secondary | ICD-10-CM

## 2019-06-28 ENCOUNTER — Other Ambulatory Visit: Payer: Self-pay

## 2019-06-28 ENCOUNTER — Ambulatory Visit (INDEPENDENT_AMBULATORY_CARE_PROVIDER_SITE_OTHER): Payer: Managed Care, Other (non HMO) | Admitting: Internal Medicine

## 2019-06-28 ENCOUNTER — Telehealth: Payer: Managed Care, Other (non HMO) | Admitting: Physician Assistant

## 2019-06-28 ENCOUNTER — Encounter: Payer: Self-pay | Admitting: Internal Medicine

## 2019-06-28 DIAGNOSIS — R5383 Other fatigue: Secondary | ICD-10-CM

## 2019-06-28 DIAGNOSIS — F419 Anxiety disorder, unspecified: Secondary | ICD-10-CM | POA: Diagnosis not present

## 2019-06-28 DIAGNOSIS — J22 Unspecified acute lower respiratory infection: Secondary | ICD-10-CM | POA: Insufficient documentation

## 2019-06-28 DIAGNOSIS — E119 Type 2 diabetes mellitus without complications: Secondary | ICD-10-CM

## 2019-06-28 DIAGNOSIS — Z20822 Contact with and (suspected) exposure to covid-19: Secondary | ICD-10-CM

## 2019-06-28 MED ORDER — HYDROCODONE-HOMATROPINE 5-1.5 MG/5ML PO SYRP: 5.0000 mL | ORAL_SOLUTION | Freq: Four times a day (QID) | ORAL | 0 refills | Status: AC | PRN

## 2019-06-28 MED ORDER — AZITHROMYCIN 250 MG PO TABS: ORAL_TABLET | ORAL | 1 refills | Status: DC

## 2019-06-28 NOTE — Patient Instructions (Signed)
Please take all new medication as prescribed  - the antibiotic and cough medicine  Please continue all other medications as before, and refills have been done if requested.  Please have the pharmacy call with any other refills you may need.  Please continue your efforts at being more active, low cholesterol diet, and weight control.  Please keep your appointments with your specialists as you may have planned  Please follow up on your COVID test which hopefully will return in 7 days

## 2019-06-28 NOTE — Assessment & Plan Note (Signed)
stable overall by history and exam, recent data reviewed with pt, and pt to continue medical treatment as before,  to f/u any worsening symptoms or concerns  

## 2019-06-28 NOTE — Assessment & Plan Note (Signed)
Mild to mod, for antibx course,  to f/u any worsening symptoms or concerns, f/u COVID testing results

## 2019-06-28 NOTE — Progress Notes (Signed)
Patient ID: Tommy May, male   DOB: 1960-12-05, 58 y.o.   MRN: [id number]  Virtual Visit via Video Note  I connected with Tommy May on 06/28/19 at  4:00 PM EDT by a video enabled telemedicine application and verified that I am speaking with the correct person using two identifiers.  Location: Patient: at home Provider: at work   I discussed the limitations of evaluation and management by telemedicine and the availability of in person appointments. The patient expressed understanding and agreed to proceed.  History of Present Illness:  Here with 2-3 days acute onset fever, facial pain, pressure, headache, general weakness and malaise, with mild ST and cough, fatigue, diarrhea, achiness and sinus congestion, but pt denies chest pain, wheezing, increased sob or doe, orthopnea, PND, increased LE swelling, palpitations, dizziness or syncope.  Has exposure to several coworkers and customers in his job, no known direct exposure, but has already had the COVID testing done earlier today, was told it may take 7 days to return.   Pt denies polydipsia, polyuria. Denies worsening depressive symptoms, suicidal ideation, or panic Past Medical History:  Diagnosis Date  . Allergy   . Carotid artery occlusion   . Environmental and seasonal allergies   . GERD (gastroesophageal reflux disease)   . Heart palpitations   . History of Holter monitoring   . Hyperlipidemia   . Hypertension    does not take meds  . Nerve pain    neck; takes gabapentin  . Sleep apnea    wears CPAP nightly   Past Surgical History:  Procedure Laterality Date  . dental implants    . ENDARTERECTOMY Left 01/12/2017   Procedure: Left Carotid ENDARTERECTOMY;  Surgeon: Conrad [place], MD;  Location: [place];  Service: Vascular;  Laterality: Left;  . INGUINAL HERNIA REPAIR Left 08/25/2014   Procedure: LEFT INGUINAL HERNIA REPAIR REMOVAL SPERMATIC CORD MASS;  Surgeon: Jackolyn Confer, MD;  Location: [place];  Service: General;   Laterality: Left;  . INSERTION OF MESH N/A 08/25/2014   Procedure: INSERTION OF MESH;  Surgeon: Jackolyn Confer, MD;  Location: [place];  Service: General;  Laterality: N/A;  . PATCH ANGIOPLASTY Left 01/12/2017   Procedure: PATCH ANGIOPLASTY;  Surgeon: Conrad [place]rio, MD;  Location: [place];  Service: Vascular;  Laterality: Left;  . ROTATOR CUFF REPAIR Left     reports that he has been smoking cigarettes. He has a 4.30 pack-year smoking history. He has never used smokeless tobacco. He reports that he does not drink alcohol or use drugs. family history includes Diabetes in his father and paternal grandmother; Heart attack in his maternal grandfather; Stroke (age of onset: 23) in his paternal grandfather; Stroke (age of onset: 7) in his father. Allergies  Allergen Reactions  . Atorvastatin Other (See Comments)    MYALGIAS   Current Outpatient Medications on File Prior to Visit  Medication Sig Dispense Refill  . acetaminophen (TYLENOL) 500 MG tablet Take 1,000 mg by mouth every 8 (eight) hours as needed for moderate pain.     Marland Kitchen aspirin EC 81 MG tablet Take 1 tablet (81 mg total) by mouth daily. (Patient taking differently: Take 81 mg by mouth at bedtime. ) 90 tablet 3  . Blood Glucose Monitoring Suppl (ONETOUCH VERIO) w/Device KIT 1 Device by Does not apply route daily. E11.9 1 kit 0  . Cholecalciferol (VITAMIN D-3) 5000 units TABS Take 5,000 Units by mouth daily.    . Cyanocobalamin (VITAMIN B-12) 2500 MCG SUBL Place  5,000 mcg under the tongue daily.    Marland Kitchen escitalopram (LEXAPRO) 20 MG tablet Take 1 tablet (20 mg total) by mouth daily. 90 tablet 1  . esomeprazole (NEXIUM) 40 MG capsule Take 40 mg by mouth daily with breakfast.     . ezetimibe (ZETIA) 10 MG tablet Take 1 tablet (10 mg total) by mouth daily. 30 tablet 11  . gabapentin (NEURONTIN) 300 MG capsule TAKE 1 CAPSULE BY MOUTH AT BEDTIME 90 capsule 1  . glucose blood (ONETOUCH VERIO) test strip Use as instructed daily E11.9 100 each 12  .  Lancets MISC Use as directed daily E11.9 100 each 3  . loratadine (CLARITIN) 10 MG tablet Take 10 mg by mouth daily.     Marland Kitchen losartan (COZAAR) 100 MG tablet TAKE 1/2 TABLET BY MOUTH DAILY 45 tablet 3  . metFORMIN (GLUCOPHAGE) 500 MG tablet Take 1 tablet (500 mg total) by mouth daily with breakfast. 90 tablet 3  . Multiple Vitamin (MULTIVITAMIN WITH MINERALS) TABS tablet Take 1 tablet by mouth daily.    . nicotine polacrilex (NICORETTE) 2 MG gum Take 2 mg by mouth every 4 (four) hours as needed for smoking cessation.    . rosuvastatin (CRESTOR) 40 MG tablet TAKE 1 TABLET BY MOUTH EVERY DAY 90 tablet 3  . tamsulosin (FLOMAX) 0.4 MG CAPS capsule Take 0.4 mg by mouth 2 (two) times daily.  11  . traMADol (ULTRAM) 50 MG tablet TAKE 1 TABLET BY MOUTH TWICE A DAY 60 tablet 0   No current facility-administered medications on file prior to visit.     Observations/Objective: Alert, NAD, mild ill, appropriate mood and affect, resps normal, cn 2-12 intact, moves all 4s, no visible rash or swelling Lab Results  Component Value Date   WBC 6.4 05/09/2019   HGB 16.7 05/09/2019   HCT 48.5 05/09/2019   PLT 148.0 (L) 05/09/2019   GLUCOSE 171 (H) 05/09/2019   CHOL 161 05/09/2019   TRIG 274.0 (H) 05/09/2019   HDL 47.10 05/09/2019   LDLDIRECT 88.0 05/09/2019   LDLCALC 125 09/08/2016   ALT 31 05/09/2019   AST 20 05/09/2019   NA 139 05/09/2019   K 4.5 05/09/2019   CL 104 05/09/2019   CREATININE 0.78 05/09/2019   BUN 9 05/09/2019   CO2 26 05/09/2019   TSH 1.60 05/09/2019   PSA 0.30 05/09/2019   INR 0.91 01/06/2017   HGBA1C 6.7 (H) 05/09/2019   MICROALBUR 1.3 05/09/2019   Assessment and Plan: See notes  Follow Up Instructions: See notes   I discussed the assessment and treatment plan with the patient. The patient was provided an opportunity to ask questions and all were answered. The patient agreed with the plan and demonstrated an understanding of the instructions.   The patient was advised to  call back or seek an in-person evaluation if the symptoms worsen or if the condition fails to improve as anticipated.   Cathlean Cower, MD

## 2019-06-28 NOTE — Progress Notes (Signed)
Based on what you shared with me, I feel your condition warrants further evaluation and I recommend that you be seen for a face to face office visit. You can call to schedule an appointment with your primary care doctor or seek care at a local urgent care.   NOTE: If you entered your credit card information for this eVisit, you will not be charged. You may see a "hold" on your card for the $35 but that hold will drop off and you will not have a charge processed.  If you are having a true medical emergency please call 911.     The following sites will take your insurance:  . [facility]    [phone]                  Get Driving Directions  [address], [place] [address] . 10 am to 8 pm Monday-Friday . 12 pm to 8 pm Saturday-Sunday   . [facility] [place]                  Get Driving Directions  [address], [place], [place] [address] . 8 am to 8 pm Monday-Friday . 9 am to 6 pm Saturday . 11 am to 6 pm Sunday   . [facility] at [place]                  Get Driving Directions   [address] [place], [place] [address] . 8 am to 8 pm Monday-Friday . 8 am to 4 pm Saturday-Sunday    . [facility] at [place]                    Get Driving Directions  [phone]  [address], [place], [place] [address]  . Monday-Friday, 12 PM to 6 PM    Your e-visit answers were reviewed by a board certified advanced clinical practitioner to complete your personal care plan.  Thank you for using e-Visits.  I have spent 5 minutes in review of e-visit questionnaire, review and updating patient chart, medical decision making and response to patient.    Tenna Delaine, PA-C

## 2019-06-28 NOTE — Progress Notes (Deleted)
   Subjective:    Patient ID: Tommy May, male    DOB: [birth year], 58 y.o.   MRN: [id number]  HPI    Review of Systems     Objective:   Physical Exam        Assessment & Plan:

## 2019-06-29 LAB — NOVEL CORONAVIRUS, NAA: SARS-CoV-2, NAA: NOT DETECTED

## 2019-07-11 ENCOUNTER — Other Ambulatory Visit: Payer: Self-pay | Admitting: *Deleted

## 2019-07-11 DIAGNOSIS — F419 Anxiety disorder, unspecified: Secondary | ICD-10-CM

## 2019-07-11 MED ORDER — ESCITALOPRAM OXALATE 20 MG PO TABS: 20.0000 mg | ORAL_TABLET | Freq: Every day | ORAL | 1 refills | Status: DC

## 2019-08-08 ENCOUNTER — Other Ambulatory Visit: Payer: Self-pay | Admitting: Cardiology

## 2019-08-08 DIAGNOSIS — E785 Hyperlipidemia, unspecified: Secondary | ICD-10-CM

## 2019-08-10 ENCOUNTER — Other Ambulatory Visit: Payer: Self-pay | Admitting: Internal Medicine

## 2019-08-10 DIAGNOSIS — F419 Anxiety disorder, unspecified: Secondary | ICD-10-CM

## 2019-08-14 NOTE — Progress Notes (Signed)
HPI: FUhyperlipidemia and hypertension. Nuclear study September 2003 showed ejection fraction 72% and normal perfusion. Echocardiogram September 2016 showed normal LV systolic function and grade 1 diastolic dysfunction. Carotid DopplersDecember 2017 showed 80-99% left and 1-39% right stenosis. He underwent carotid endarterectomy and carotid is now followed by vascular surgery.  Since last seen,the patient denies any dyspnea on exertion, orthopnea, PND, pedal edema, palpitations, syncope or chest pain.   Current Outpatient Medications  Medication Sig Dispense Refill  . acetaminophen (TYLENOL) 500 MG tablet Take 1,000 mg by mouth every 8 (eight) hours as needed for moderate pain.     Marland Kitchen aspirin EC 81 MG tablet Take 1 tablet (81 mg total) by mouth daily. (Patient taking differently: Take 81 mg by mouth at bedtime. ) 90 tablet 3  . azithromycin (ZITHROMAX Z-PAK) 250 MG tablet 2 tab by mouth day 1, then 1 per day 6 tablet 1  . Blood Glucose Monitoring Suppl (ONETOUCH VERIO) w/Device KIT 1 Device by Does not apply route daily. E11.9 1 kit 0  . Cholecalciferol (VITAMIN D-3) 5000 units TABS Take 5,000 Units by mouth daily.    . Cyanocobalamin (VITAMIN B-12) 2500 MCG SUBL Place 5,000 mcg under the tongue daily.    Marland Kitchen escitalopram (LEXAPRO) 20 MG tablet TAKE 1 TABLET BY MOUTH EVERY DAY 90 tablet 0  . esomeprazole (NEXIUM) 40 MG capsule Take 40 mg by mouth daily with breakfast.     . ezetimibe (ZETIA) 10 MG tablet Take 1 tablet (10 mg total) by mouth daily. 30 tablet 11  . gabapentin (NEURONTIN) 300 MG capsule TAKE 1 CAPSULE BY MOUTH AT BEDTIME 90 capsule 1  . glucose blood (ONETOUCH VERIO) test strip Use as instructed daily E11.9 100 each 12  . Lancets MISC Use as directed daily E11.9 100 each 3  . loratadine (CLARITIN) 10 MG tablet Take 10 mg by mouth daily.     Marland Kitchen losartan (COZAAR) 100 MG tablet TAKE 1/2 TABLET BY MOUTH DAILY 45 tablet 3  . metFORMIN (GLUCOPHAGE) 500 MG tablet Take 1 tablet (500  mg total) by mouth daily with breakfast. 90 tablet 3  . Multiple Vitamin (MULTIVITAMIN WITH MINERALS) TABS tablet Take 1 tablet by mouth daily.    . nicotine polacrilex (NICORETTE) 2 MG gum Take 2 mg by mouth every 4 (four) hours as needed for smoking cessation.    . rosuvastatin (CRESTOR) 40 MG tablet TAKE 1 TABLET BY MOUTH EVERY DAY 90 tablet 0  . tamsulosin (FLOMAX) 0.4 MG CAPS capsule Take 0.4 mg by mouth 2 (two) times daily.  11  . traMADol (ULTRAM) 50 MG tablet TAKE 1 TABLET BY MOUTH TWICE A DAY 60 tablet 0   No current facility-administered medications for this visit.      Past Medical History:  Diagnosis Date  . Allergy   . Carotid artery occlusion   . Environmental and seasonal allergies   . GERD (gastroesophageal reflux disease)   . Heart palpitations   . History of Holter monitoring   . Hyperlipidemia   . Hypertension    does not take meds  . Nerve pain    neck; takes gabapentin  . Sleep apnea    wears CPAP nightly    Past Surgical History:  Procedure Laterality Date  . dental implants    . ENDARTERECTOMY Left 01/12/2017   Procedure: Left Carotid ENDARTERECTOMY;  Surgeon: Conrad Sterling, MD;  Location: [facility];  Service: Vascular;  Laterality: Left;  . INGUINAL HERNIA REPAIR Left  08/25/2014   Procedure: LEFT INGUINAL HERNIA REPAIR REMOVAL SPERMATIC CORD MASS;  Surgeon: Jackolyn Confer, MD;  Location: [facility];  Service: General;  Laterality: Left;  . INSERTION OF MESH N/A 08/25/2014   Procedure: INSERTION OF MESH;  Surgeon: Jackolyn Confer, MD;  Location: [facility];  Service: General;  Laterality: N/A;  . PATCH ANGIOPLASTY Left 01/12/2017   Procedure: PATCH ANGIOPLASTY;  Surgeon: Conrad Penn, MD;  Location: [facility];  Service: Vascular;  Laterality: Left;  . ROTATOR CUFF REPAIR Left     Social History   Socioeconomic History  . Marital status: Married    Spouse name: Not on file  . Number of children: 2  . Years of education: 98  . Highest education level: Not on file   Occupational History  . Occupation: Haematologist Needs  . Financial resource strain: Not on file  . Food insecurity    Worry: Not on file    Inability: Not on file  . Transportation needs    Medical: Not on file    Non-medical: Not on file  Tobacco Use  . Smoking status: Current Every Day Smoker    Packs/day: 0.10    Years: 43.00    Pack years: 4.30    Types: Cigarettes  . Smokeless tobacco: Never Used  . Tobacco comment: Down to 1/2 pk per day  Substance and Sexual Activity  . Alcohol use: No  . Drug use: No  . Sexual activity: Not on file  Lifestyle  . Physical activity    Days per week: Not on file    Minutes per session: Not on file  . Stress: Not on file  Relationships  . Social Herbalist on phone: Not on file    Gets together: Not on file    Attends religious service: Not on file    Active member of club or organization: Not on file    Attends meetings of clubs or organizations: Not on file    Relationship status: Not on file  . Intimate partner violence    Fear of current or ex partner: Not on file    Emotionally abused: Not on file    Physically abused: Not on file    Forced sexual activity: Not on file  Other Topics Concern  . Not on file  Social History Narrative   Pt lives with his wife and two children.  Graduated high school.    Family History  Problem Relation Age of Onset  . Stroke Father 42  . Diabetes Father   . Diabetes Paternal Grandmother   . Stroke Paternal Grandfather 1       guess early 58's  . Heart attack Maternal Grandfather     ROS: no fevers or chills, productive cough, hemoptysis, dysphasia, odynophagia, melena, hematochezia, dysuria, hematuria, rash, seizure activity, orthopnea, PND, pedal edema, claudication. Remaining systems are negative.  Physical Exam: Well-developed well-nourished in no acute distress.  Skin is warm and dry.  HEENT is normal.  Neck is supple.  Chest is clear to auscultation with  normal expansion.  Cardiovascular exam is regular rate and rhythm.  Abdominal exam nontender or distended. No masses palpated. Extremities show no edema. neuro grossly intact  ECG-sinus rhythm at a rate of 58, no ST changes.  Personally reviewed  A/P  1 hypertension-patient's blood pressure is elevated.  Increase losartan to 100 mg daily and follow.  Check potassium and renal function in 1 week.  2 hyperlipidemia-continue Crestor and  Zetia.  Check lipids and liver in 12 weeks.  3 carotid artery disease-schedule follow-up carotid Dopplers.  Continue medical therapy with aspirin and statin.  4 tobacco abuse-patient counseled on discontinuing.  Kirk Ruths, MD

## 2019-08-19 ENCOUNTER — Encounter: Payer: Self-pay | Admitting: Cardiology

## 2019-08-19 ENCOUNTER — Other Ambulatory Visit: Payer: Self-pay

## 2019-08-19 ENCOUNTER — Ambulatory Visit: Payer: Managed Care, Other (non HMO) | Admitting: Cardiology

## 2019-08-19 VITALS — BP 144/80 | HR 58 | Temp 97.3°F | Ht 70.0 in | Wt 204.0 lb

## 2019-08-19 DIAGNOSIS — I10 Essential (primary) hypertension: Secondary | ICD-10-CM | POA: Diagnosis not present

## 2019-08-19 DIAGNOSIS — R002 Palpitations: Secondary | ICD-10-CM

## 2019-08-19 DIAGNOSIS — E7800 Pure hypercholesterolemia, unspecified: Secondary | ICD-10-CM

## 2019-08-19 DIAGNOSIS — I6523 Occlusion and stenosis of bilateral carotid arteries: Secondary | ICD-10-CM

## 2019-08-19 MED ORDER — EZETIMIBE 10 MG PO TABS: 10.0000 mg | ORAL_TABLET | Freq: Every day | ORAL | 3 refills | Status: DC

## 2019-08-19 MED ORDER — LOSARTAN POTASSIUM 100 MG PO TABS: 100.0000 mg | ORAL_TABLET | Freq: Every day | ORAL | 3 refills | Status: DC

## 2019-08-19 NOTE — Patient Instructions (Signed)
Medication Instructions:  INCREASE LOSARTAN TO 100 MG ONCE DAILY  If you need a refill on your cardiac medications before your next appointment, please call your pharmacy.   Lab work: Your physician recommends that you return for lab work in: [place] recommends that you return for lab work in: [place]  If you have labs (blood work) drawn today and your tests are completely normal, you will receive your results only by: Marland Kitchen MyChart Message (if you have MyChart) OR . A paper copy in the mail If you have any lab test that is abnormal or we need to change your treatment, we will call you to review the results.  Testing/Procedures: Your physician has requested that you have a carotid duplex. This test is an ultrasound of the carotid arteries in your neck. It looks at blood flow through these arteries that supply the brain with blood. Allow one hour for this exam. There are no restrictions or special instructions.[facility]    Follow-Up: At [facility], you and your health needs are our priority.  As part of our continuing mission to provide you with exceptional heart care, we have created designated Provider Care Teams.  These Care Teams include your primary Cardiologist (physician) and Advanced Practice Providers (APPs -  Physician Assistants and Nurse Practitioners) who all work together to provide you with the care you need, when you need it. You will need a follow up appointment in 12 months.  Please call our office 2 months in advance to schedule this appointment.  You may see Kirk Ruths MD or one of the following Advanced Practice Providers on your designated Care Team:   Kerin Ransom, PA-C Roby Lofts, [place] . Sande Rives, PA-C

## 2019-09-03 ENCOUNTER — Ambulatory Visit (HOSPITAL_COMMUNITY)
Admission: RE | Admit: 2019-09-03 | Discharge: 2019-09-03 | Disposition: A | Discharge status: Home / Self Care | Payer: Managed Care, Other (non HMO) | Source: Ambulatory Visit | Attending: Cardiovascular Disease | Admitting: Cardiovascular Disease

## 2019-09-03 ENCOUNTER — Other Ambulatory Visit: Payer: Self-pay

## 2019-09-03 DIAGNOSIS — I6523 Occlusion and stenosis of bilateral carotid arteries: Secondary | ICD-10-CM | POA: Insufficient documentation

## 2019-09-05 ENCOUNTER — Other Ambulatory Visit: Payer: Self-pay | Admitting: Internal Medicine

## 2019-09-05 DIAGNOSIS — M5010 Cervical disc disorder with radiculopathy, unspecified cervical region: Secondary | ICD-10-CM

## 2019-09-05 NOTE — Telephone Encounter (Signed)
Done erx 

## 2019-09-12 ENCOUNTER — Encounter: Payer: Self-pay | Admitting: *Deleted

## 2019-11-05 ENCOUNTER — Other Ambulatory Visit: Payer: Self-pay | Admitting: Internal Medicine

## 2019-11-05 ENCOUNTER — Other Ambulatory Visit: Payer: Self-pay | Admitting: Cardiology

## 2019-11-05 DIAGNOSIS — E785 Hyperlipidemia, unspecified: Secondary | ICD-10-CM

## 2019-11-05 DIAGNOSIS — F419 Anxiety disorder, unspecified: Secondary | ICD-10-CM

## 2019-11-05 NOTE — Telephone Encounter (Signed)
Rx request sent to pharmacy.  

## 2019-11-18 ENCOUNTER — Encounter: Payer: Self-pay | Admitting: Internal Medicine

## 2019-11-18 ENCOUNTER — Other Ambulatory Visit: Payer: Self-pay

## 2019-11-18 ENCOUNTER — Ambulatory Visit (INDEPENDENT_AMBULATORY_CARE_PROVIDER_SITE_OTHER): Payer: Managed Care, Other (non HMO) | Admitting: Internal Medicine

## 2019-11-18 VITALS — BP 180/82 | HR 75 | Temp 98.1°F | Resp 12 | Ht 70.0 in | Wt 209.0 lb

## 2019-11-18 DIAGNOSIS — M545 Low back pain, unspecified: Secondary | ICD-10-CM

## 2019-11-18 DIAGNOSIS — E7800 Pure hypercholesterolemia, unspecified: Secondary | ICD-10-CM

## 2019-11-18 DIAGNOSIS — E119 Type 2 diabetes mellitus without complications: Secondary | ICD-10-CM | POA: Diagnosis not present

## 2019-11-18 DIAGNOSIS — G8929 Other chronic pain: Secondary | ICD-10-CM

## 2019-11-18 DIAGNOSIS — I10 Essential (primary) hypertension: Secondary | ICD-10-CM | POA: Diagnosis not present

## 2019-11-18 DIAGNOSIS — E538 Deficiency of other specified B group vitamins: Secondary | ICD-10-CM

## 2019-11-18 DIAGNOSIS — Z1211 Encounter for screening for malignant neoplasm of colon: Secondary | ICD-10-CM

## 2019-11-18 DIAGNOSIS — Z0000 Encounter for general adult medical examination without abnormal findings: Secondary | ICD-10-CM

## 2019-11-18 DIAGNOSIS — E611 Iron deficiency: Secondary | ICD-10-CM

## 2019-11-18 DIAGNOSIS — E559 Vitamin D deficiency, unspecified: Secondary | ICD-10-CM

## 2019-11-18 MED ORDER — PREDNISONE 10 MG PO TABS: ORAL_TABLET | ORAL | 0 refills | Status: DC

## 2019-11-18 NOTE — Progress Notes (Signed)
Subjective:    Patient ID: Tommy May, male    DOB: 02/11/1961, 58 y.o.   MRN: [id number]  HPI  Here to f/u; overall doing ok,  Pt denies chest pain, increasing sob or doe, wheezing, orthopnea, PND, increased LE swelling, palpitations, dizziness or syncope.  Pt denies new neurological symptoms such as new headache, or facial or extremity weakness or numbness.  Pt denies polydipsia, polyuria, or low sugar episode.  Pt states overall good compliance with meds, mostly trying to follow appropriate diet, with wt overall stable,  but little exercise however.  Plans to schedule eye appt. Pt continues to have recurring LBP without change in severity, bowel or bladder change, fever, wt loss,  worsening LE pain/numbness/weakness, gait change or falls. Has seen murphy wainer, asks for predpac today for increased pain.  Also mentions does have occas diarrhea with metformin so does not want increased dose, though current is tolerable.  Due for cologuard Past Medical History:  Diagnosis Date  . Allergy   . Carotid artery occlusion   . Environmental and seasonal allergies   . GERD (gastroesophageal reflux disease)   . Heart palpitations   . History of Holter monitoring   . Hyperlipidemia   . Hypertension    does not take meds  . Nerve pain    neck; takes gabapentin  . Sleep apnea    wears CPAP nightly   Past Surgical History:  Procedure Laterality Date  . dental implants    . ENDARTERECTOMY Left 01/12/2017   Procedure: Left Carotid ENDARTERECTOMY;  Surgeon: Conrad Kathryn, MD;  Location: [facility];  Service: Vascular;  Laterality: Left;  . INGUINAL HERNIA REPAIR Left 08/25/2014   Procedure: LEFT INGUINAL HERNIA REPAIR REMOVAL SPERMATIC CORD MASS;  Surgeon: Jackolyn Confer, MD;  Location: [facility];  Service: General;  Laterality: Left;  . INSERTION OF MESH N/A 08/25/2014   Procedure: INSERTION OF MESH;  Surgeon: Jackolyn Confer, MD;  Location: [facility];  Service: General;  Laterality: N/A;  . PATCH  ANGIOPLASTY Left 01/12/2017   Procedure: PATCH ANGIOPLASTY;  Surgeon: Conrad Caledonia, MD;  Location: [facility];  Service: Vascular;  Laterality: Left;  . ROTATOR CUFF REPAIR Left     reports that he has been smoking cigarettes. He has a 4.30 pack-year smoking history. He has never used smokeless tobacco. He reports that he does not drink alcohol or use drugs. family history includes Diabetes in his father and paternal grandmother; Heart attack in his maternal grandfather; Stroke (age of onset: 75) in his paternal grandfather; Stroke (age of onset: 58) in his father. Allergies  Allergen Reactions  . Atorvastatin Other (See Comments)    MYALGIAS   Current Outpatient Medications on File Prior to Visit  Medication Sig Dispense Refill  . acetaminophen (TYLENOL) 500 MG tablet Take 1,000 mg by mouth every 8 (eight) hours as needed for moderate pain.     Marland Kitchen aspirin EC 81 MG tablet Take 1 tablet (81 mg total) by mouth daily. (Patient taking differently: Take 81 mg by mouth at bedtime. ) 90 tablet 3  . Blood Glucose Monitoring Suppl (ONETOUCH VERIO) w/Device KIT 1 Device by Does not apply route daily. E11.9 1 kit 0  . escitalopram (LEXAPRO) 20 MG tablet TAKE 1 TABLET BY MOUTH EVERY DAY 90 tablet 0  . esomeprazole (NEXIUM) 40 MG capsule Take 40 mg by mouth daily with breakfast.     . ezetimibe (ZETIA) 10 MG tablet Take 1 tablet (10 mg total) by mouth  daily. 90 tablet 3  . gabapentin (NEURONTIN) 300 MG capsule TAKE 1 CAPSULE BY MOUTH AT BEDTIME 90 capsule 1  . glucose blood (ONETOUCH VERIO) test strip Use as instructed daily E11.9 100 each 12  . Lancets MISC Use as directed daily E11.9 100 each 3  . loratadine (CLARITIN) 10 MG tablet Take 10 mg by mouth daily.     Marland Kitchen losartan (COZAAR) 100 MG tablet Take 1 tablet (100 mg total) by mouth daily. 90 tablet 3  . metFORMIN (GLUCOPHAGE) 500 MG tablet Take 1 tablet (500 mg total) by mouth daily with breakfast. 90 tablet 3  . Multiple Vitamin (MULTIVITAMIN WITH MINERALS)  TABS tablet Take 1 tablet by mouth daily.    . nicotine polacrilex (NICORETTE) 2 MG gum Take 2 mg by mouth every 4 (four) hours as needed for smoking cessation.    . rosuvastatin (CRESTOR) 40 MG tablet TAKE 1 TABLET BY MOUTH EVERY DAY 90 tablet 1  . traMADol (ULTRAM) 50 MG tablet TAKE 1 TABLET BY MOUTH TWICE A DAY 60 tablet 2   No current facility-administered medications on file prior to visit.   Review of Systems  Constitutional: Negative for other unusual diaphoresis or sweats HENT: Negative for ear discharge or swelling Eyes: Negative for other worsening visual disturbances Respiratory: Negative for stridor or other swelling  Gastrointestinal: Negative for worsening distension or other blood Genitourinary: Negative for retention or other urinary change Musculoskeletal: Negative for other MSK pain or swelling Skin: Negative for color change or other new lesions Neurological: Negative for worsening tremors and other numbness  Psychiatric/Behavioral: Negative for worsening agitation or other fatigue All otherwise neg per pt     Objective:   Physical Exam BP (!) 180/82   Pulse 75   Temp 98.1 F (36.7 C)   Resp 12   Ht _0  (1.778 m)   Wt 209 lb (94.8 kg)   SpO2 97%   BMI 29.99 kg/m  VS noted,  Constitutional: Pt appears in NAD HENT: Head: NCAT.  Right Ear: External ear normal.  Left Ear: External ear normal.  Eyes: . Pupils are equal, round, and reactive to light. Conjunctivae and EOM are normal Nose: without d/c or deformity Neck: Neck supple. Gross normal ROM Cardiovascular: Normal rate and regular rhythm.   Pulmonary/Chest: Effort normal and breath sounds without rales or wheezing.  Abd:  Soft, NT, ND, + BS, no organomegaly Neurological: Pt is alert. At baseline orientation, motor grossly intact Skin: Skin is warm. No rashes, other new lesions, no LE edema Psychiatric: Pt behavior is normal without agitation  All otherwise neg per pt   Lab Results  Component  Value Date   WBC 6.4 05/09/2019   HGB 16.7 05/09/2019   HCT 48.5 05/09/2019   PLT 148.0 (L) 05/09/2019   GLUCOSE 171 (H) 05/09/2019   CHOL 161 05/09/2019   TRIG 274.0 (H) 05/09/2019   HDL 47.10 05/09/2019   LDLDIRECT 88.0 05/09/2019   LDLCALC 125 09/08/2016   ALT 31 05/09/2019   AST 20 05/09/2019   NA 139 05/09/2019   K 4.5 05/09/2019   CL 104 05/09/2019   CREATININE 0.78 05/09/2019   BUN 9 05/09/2019   CO2 26 05/09/2019   TSH 1.60 05/09/2019   PSA 0.30 05/09/2019   INR 0.91 01/06/2017   HGBA1C 6.7 (H) 05/09/2019   MICROALBUR 1.3 05/09/2019       Assessment & Plan:  pred3

## 2019-11-18 NOTE — Patient Instructions (Addendum)
Please remember to call for your yearly eye doctor appt  We will sign you up for the Cologuard  Please take all new medication as prescribed - the prednisone  Please continue all other medications as before, and refills have been done if requested.  Please have the pharmacy call with any other refills you may need.  Please continue your efforts at being more active, low cholesterol diet, and weight control.  Please keep your appointments with your specialists as you may have planned  Please go to the LAB at the blood drawing area for the tests to be done  You will be contacted by phone if any changes need to be made immediately.  Otherwise, you will receive a letter about your results with an explanation, but please check with MyChart first.  Please remember to sign up for MyChart if you have not done so, as this will be important to you in the future with finding out test results, communicating by private email, and scheduling acute appointments online when needed.  Please return in 6 months, or sooner if needed, with Lab testing done 3-5 days before

## 2019-11-19 LAB — LDL CHOLESTEROL, DIRECT: Direct LDL: 78 mg/dL

## 2019-11-19 LAB — BASIC METABOLIC PANEL
BUN: 9 mg/dL (ref 6–23)
CO2: 28 mEq/L (ref 19–32)
Calcium: 9.1 mg/dL (ref 8.4–10.5)
Chloride: 103 mEq/L (ref 96–112)
Creatinine, Ser: 0.79 mg/dL (ref 0.40–1.50)
GFR: 99.81 mL/min (ref >60.00)
Glucose, Bld: 101 mg/dL — ABNORMAL HIGH (ref 70–99)
Potassium: 4.7 mEq/L (ref 3.5–5.1)
Sodium: 135 mEq/L (ref 135–145)

## 2019-11-19 LAB — LIPID PANEL
Cholesterol: 166 mg/dL (ref 0–200)
HDL: 45.9 mg/dL (ref >39.00)
NonHDL: 119.79
Total CHOL/HDL Ratio: 4
Triglycerides: 305 mg/dL — ABNORMAL HIGH (ref 0.0–149.0)
VLDL: 61 mg/dL — ABNORMAL HIGH (ref 0.0–40.0)

## 2019-11-19 LAB — HEPATIC FUNCTION PANEL
ALT: 33 U/L (ref 0–53)
AST: 26 U/L (ref 0–37)
Albumin: 3.7 g/dL (ref 3.5–5.2)
Alkaline Phosphatase: 50 U/L (ref 39–117)
Bilirubin, Direct: 0.1 mg/dL (ref 0.0–0.3)
Total Bilirubin: 0.5 mg/dL (ref 0.2–1.2)
Total Protein: 6.2 g/dL (ref 6.0–8.3)

## 2019-11-19 LAB — HEMOGLOBIN A1C: Hgb A1c MFr Bld: 6.2 % (ref 4.6–6.5)

## 2019-11-22 ENCOUNTER — Encounter: Payer: Self-pay | Admitting: Internal Medicine

## 2019-11-22 NOTE — Assessment & Plan Note (Signed)
stable overall by history and exam, recent data reviewed with pt, and pt to continue medical treatment as before,  to f/u any worsening symptoms or concerns  

## 2019-11-22 NOTE — Assessment & Plan Note (Addendum)
Mild worsening, for predpac asd, stable overall by history and exam, recent data reviewed with pt, and pt to continue medical treatment as before,  to f/u any worsening symptoms or concerns

## 2019-11-27 NOTE — Addendum Note (Signed)
Addended by: Elta Guadeloupe on: 11/27/2019 01:56 PM   Modules accepted: Orders

## 2019-11-28 ENCOUNTER — Other Ambulatory Visit: Payer: Self-pay | Admitting: Internal Medicine

## 2019-11-28 DIAGNOSIS — M5010 Cervical disc disorder with radiculopathy, unspecified cervical region: Secondary | ICD-10-CM

## 2019-11-28 NOTE — Telephone Encounter (Addendum)
[place] Controlled Database Checked Last filled: 10/31/19 # 60 LOV w/you: 11/18/19 Next appt w/you: None

## 2019-11-28 NOTE — Telephone Encounter (Signed)
Done erx 

## 2019-12-02 ENCOUNTER — Telehealth: Payer: Self-pay | Admitting: *Deleted

## 2019-12-02 NOTE — Telephone Encounter (Signed)
Tramadol 50 mg PA initiated via CoverMyMeds. [facility] Rx #: [id number]

## 2019-12-04 NOTE — Telephone Encounter (Signed)
Tramadol PA is denied.

## 2019-12-06 ENCOUNTER — Other Ambulatory Visit: Payer: Self-pay | Admitting: Internal Medicine

## 2019-12-06 DIAGNOSIS — M5010 Cervical disc disorder with radiculopathy, unspecified cervical region: Secondary | ICD-10-CM

## 2020-01-25 ENCOUNTER — Other Ambulatory Visit: Payer: Self-pay | Admitting: Cardiology

## 2020-01-25 ENCOUNTER — Ambulatory Visit: Payer: Self-pay | Attending: Internal Medicine

## 2020-01-25 DIAGNOSIS — Z23 Encounter for immunization: Secondary | ICD-10-CM | POA: Insufficient documentation

## 2020-01-25 NOTE — Progress Notes (Signed)
   Covid-19 Vaccination Clinic  Name:  Tommy May    MRN: [id number] DOB: 11/18/1961  01/25/2020  [name] was observed post Covid-19 immunization for 15 minutes without incident. He was provided with Vaccine Information Sheet and instruction to access the V-Safe system.   [name] was instructed to call 911 with any severe reactions post vaccine: Marland Kitchen Difficulty breathing  . Swelling of face and throat  . A fast heartbeat  . A bad rash all over body  . Dizziness and weakness   Immunizations Administered    Name Date Dose VIS Date Route   Pfizer COVID-19 Vaccine 01/25/2020 10:14 AM 0.3 mL 11/01/2019 Intramuscular   Manufacturer: [place]   Lot: [id number]   [place]: [id number]

## 2020-02-09 ENCOUNTER — Other Ambulatory Visit: Payer: Self-pay | Admitting: Internal Medicine

## 2020-02-09 DIAGNOSIS — F419 Anxiety disorder, unspecified: Secondary | ICD-10-CM

## 2020-02-15 ENCOUNTER — Ambulatory Visit: Payer: Self-pay | Attending: Internal Medicine

## 2020-02-15 DIAGNOSIS — Z23 Encounter for immunization: Secondary | ICD-10-CM

## 2020-02-15 NOTE — Progress Notes (Signed)
   Covid-19 Vaccination Clinic  Name:  Tommy May    MRN: [id number] DOB: 01/01/1961  02/15/2020  Tommy May was observed post Covid-19 immunization for 15 minutes without incident. He was provided with Vaccine Information Sheet and instruction to access the V-Safe system.   Tommy May was instructed to call 911 with any severe reactions post vaccine: Marland Kitchen Difficulty breathing  . Swelling of face and throat  . A fast heartbeat  . A bad rash all over body  . Dizziness and weakness   Immunizations Administered    Name Date Dose VIS Date Route   Pfizer COVID-19 Vaccine 02/15/2020 11:21 AM 0.3 mL 11/01/2019 Intramuscular   Manufacturer: [place]   Lot: [id number]   [place]: [id number]

## 2020-02-20 ENCOUNTER — Other Ambulatory Visit: Payer: Self-pay | Admitting: Internal Medicine

## 2020-02-20 DIAGNOSIS — M5010 Cervical disc disorder with radiculopathy, unspecified cervical region: Secondary | ICD-10-CM

## 2020-02-20 NOTE — Telephone Encounter (Signed)
Done erx 

## 2020-04-16 DIAGNOSIS — N5201 Erectile dysfunction due to arterial insufficiency: Secondary | ICD-10-CM | POA: Diagnosis not present

## 2020-04-16 DIAGNOSIS — N401 Enlarged prostate with lower urinary tract symptoms: Secondary | ICD-10-CM | POA: Diagnosis not present

## 2020-04-16 DIAGNOSIS — R3915 Urgency of urination: Secondary | ICD-10-CM | POA: Diagnosis not present

## 2020-04-16 DIAGNOSIS — R350 Frequency of micturition: Secondary | ICD-10-CM | POA: Diagnosis not present

## 2020-05-14 ENCOUNTER — Other Ambulatory Visit: Payer: Self-pay | Admitting: Internal Medicine

## 2020-05-14 DIAGNOSIS — M5010 Cervical disc disorder with radiculopathy, unspecified cervical region: Secondary | ICD-10-CM

## 2020-05-14 NOTE — Telephone Encounter (Signed)
Done erx 

## 2020-05-15 ENCOUNTER — Other Ambulatory Visit: Payer: Self-pay | Admitting: Internal Medicine

## 2020-05-15 ENCOUNTER — Other Ambulatory Visit: Payer: Self-pay | Admitting: Cardiology

## 2020-05-15 DIAGNOSIS — E785 Hyperlipidemia, unspecified: Secondary | ICD-10-CM

## 2020-05-15 NOTE — Telephone Encounter (Signed)
Please refill as per office routine med refill policy (all routine meds refilled for 3 mo or monthly per pt preference up to one year from last visit, then month to month grace period for 3 mo, then further med refills will have to be denied)  

## 2020-05-19 DIAGNOSIS — R350 Frequency of micturition: Secondary | ICD-10-CM | POA: Diagnosis not present

## 2020-05-19 DIAGNOSIS — R3915 Urgency of urination: Secondary | ICD-10-CM | POA: Diagnosis not present

## 2020-05-19 DIAGNOSIS — N5201 Erectile dysfunction due to arterial insufficiency: Secondary | ICD-10-CM | POA: Diagnosis not present

## 2020-05-19 DIAGNOSIS — N401 Enlarged prostate with lower urinary tract symptoms: Secondary | ICD-10-CM | POA: Diagnosis not present

## 2020-05-20 ENCOUNTER — Other Ambulatory Visit: Payer: Self-pay

## 2020-05-20 ENCOUNTER — Encounter: Payer: Self-pay | Admitting: Internal Medicine

## 2020-05-20 ENCOUNTER — Ambulatory Visit: Payer: BC Managed Care – PPO | Admitting: Internal Medicine

## 2020-05-20 ENCOUNTER — Other Ambulatory Visit: Payer: Self-pay | Admitting: Internal Medicine

## 2020-05-20 VITALS — BP 140/80 | HR 67 | Temp 98.5°F | Ht 70.0 in | Wt 191.0 lb

## 2020-05-20 DIAGNOSIS — G8929 Other chronic pain: Secondary | ICD-10-CM

## 2020-05-20 DIAGNOSIS — T148XXA Other injury of unspecified body region, initial encounter: Secondary | ICD-10-CM | POA: Diagnosis not present

## 2020-05-20 DIAGNOSIS — Z0001 Encounter for general adult medical examination with abnormal findings: Secondary | ICD-10-CM

## 2020-05-20 DIAGNOSIS — F4323 Adjustment disorder with mixed anxiety and depressed mood: Secondary | ICD-10-CM | POA: Diagnosis not present

## 2020-05-20 DIAGNOSIS — M5010 Cervical disc disorder with radiculopathy, unspecified cervical region: Secondary | ICD-10-CM

## 2020-05-20 DIAGNOSIS — E119 Type 2 diabetes mellitus without complications: Secondary | ICD-10-CM | POA: Diagnosis not present

## 2020-05-20 DIAGNOSIS — Z114 Encounter for screening for human immunodeficiency virus [HIV]: Secondary | ICD-10-CM

## 2020-05-20 DIAGNOSIS — Z0000 Encounter for general adult medical examination without abnormal findings: Secondary | ICD-10-CM

## 2020-05-20 DIAGNOSIS — G4700 Insomnia, unspecified: Secondary | ICD-10-CM

## 2020-05-20 DIAGNOSIS — M545 Low back pain, unspecified: Secondary | ICD-10-CM

## 2020-05-20 DIAGNOSIS — Z630 Problems in relationship with spouse or partner: Secondary | ICD-10-CM | POA: Insufficient documentation

## 2020-05-20 MED ORDER — ESZOPICLONE 2 MG PO TABS: 2.0000 mg | ORAL_TABLET | Freq: Every evening | ORAL | 1 refills | Status: DC | PRN

## 2020-05-20 NOTE — Assessment & Plan Note (Signed)
New onset, for lunesta qhs prn

## 2020-05-20 NOTE — Assessment & Plan Note (Signed)

## 2020-05-20 NOTE — Assessment & Plan Note (Signed)
stable overall by history and exam, recent data reviewed with pt, and pt to continue medical treatment as before,  to f/u any worsening symptoms or concerns  

## 2020-05-20 NOTE — Progress Notes (Signed)
Subjective:    Patient ID: Tommy May, male    DOB: Oct 04, 1961, 59 y.o.   MRN: [id number]  HPI  Here for wellness and f/u;  Overall doing ok;  Pt denies Chest pain, worsening SOB, DOE, wheezing, orthopnea, PND, worsening LE edema, palpitations, dizziness or syncope.  Pt denies neurological change such as new headache, facial or extremity weakness.  Pt denies polydipsia, polyuria, or low sugar symptoms. Pt states overall good compliance with treatment and medications, good tolerability, and has been trying to follow appropriate diet.  Pt denies worsening depressive symptoms, suicidal ideation or panic. No fever, night sweats, wt loss, loss of appetite, or other constitutional symptoms.  Pt states good ability with ADL's, has low fall risk, home safety reviewed and adequate, no other significant changes in hearing or vision, and only occasionally active with exercise. Lost wt with separation, anxiety, insomnia, may need counseling.   ALso happened to fall and hit left lat leg yesterday on a hard car bumper, today with marked bruising and a tender lump area he has not seen before  Pt continues to have recurring LBP without change in severity, bowel or bladder change, fever, wt loss,  worsening LE pain/numbness/weakness, gait change or falls. Wt Readings from Last 3 Encounters:  05/20/20 191 lb (86.6 kg)  11/18/19 209 lb (94.8 kg)  08/19/19 204 lb (92.5 kg)   Past Medical History:  Diagnosis Date  . Allergy   . Carotid artery occlusion   . Environmental and seasonal allergies   . GERD (gastroesophageal reflux disease)   . Heart palpitations   . History of Holter monitoring   . Hyperlipidemia   . Hypertension    does not take meds  . Nerve pain    neck; takes gabapentin  . Sleep apnea    wears CPAP nightly   Past Surgical History:  Procedure Laterality Date  . dental implants    . ENDARTERECTOMY Left 01/12/2017   Procedure: Left Carotid ENDARTERECTOMY;  Surgeon: Conrad Lacassine, MD;   Location: [facility];  Service: Vascular;  Laterality: Left;  . INGUINAL HERNIA REPAIR Left 08/25/2014   Procedure: LEFT INGUINAL HERNIA REPAIR REMOVAL SPERMATIC CORD MASS;  Surgeon: Jackolyn Confer, MD;  Location: [facility];  Service: General;  Laterality: Left;  . INSERTION OF MESH N/A 08/25/2014   Procedure: INSERTION OF MESH;  Surgeon: Jackolyn Confer, MD;  Location: [facility];  Service: General;  Laterality: N/A;  . PATCH ANGIOPLASTY Left 01/12/2017   Procedure: PATCH ANGIOPLASTY;  Surgeon: Conrad [place], MD;  Location: [facility];  Service: Vascular;  Laterality: Left;  . ROTATOR CUFF REPAIR Left     reports that he has been smoking cigarettes. He has a 4.30 pack-year smoking history. He has never used smokeless tobacco. He reports that he does not drink alcohol and does not use drugs. family history includes Diabetes in his father and paternal grandmother; Heart attack in his maternal grandfather; Stroke (age of onset: 43) in his paternal grandfather; Stroke (age of onset: 40) in his father. Allergies  Allergen Reactions  . Atorvastatin Other (See Comments)    MYALGIAS   Current Outpatient Medications on File Prior to Visit  Medication Sig Dispense Refill  . acetaminophen (TYLENOL) 500 MG tablet Take 1,000 mg by mouth every 8 (eight) hours as needed for moderate pain.     Marland Kitchen aspirin EC 81 MG tablet Take 1 tablet (81 mg total) by mouth daily. (Patient taking differently: Take 81 mg by mouth at bedtime. )  90 tablet 3  . escitalopram (LEXAPRO) 20 MG tablet TAKE 1 TABLET BY MOUTH EVERY DAY 90 tablet 2  . esomeprazole (NEXIUM) 40 MG capsule Take 40 mg by mouth daily with breakfast.     . ezetimibe (ZETIA) 10 MG tablet Take 1 tablet (10 mg total) by mouth daily. 90 tablet 3  . gabapentin (NEURONTIN) 300 MG capsule TAKE 1 CAPSULE BY MOUTH EVERYDAY AT BEDTIME 90 capsule 1  . glucose blood (ONETOUCH VERIO) test strip Use as instructed daily E11.9 100 each 12  . Lancets MISC Use as directed daily E11.9 100 each 3  .  loratadine (CLARITIN) 10 MG tablet Take 10 mg by mouth daily.     Marland Kitchen losartan (COZAAR) 100 MG tablet TAKE 1/2 TABLET BY MOUTH EVERY DAY 45 tablet 3  . Multiple Vitamin (MULTIVITAMIN WITH MINERALS) TABS tablet Take 1 tablet by mouth daily.    . nicotine polacrilex (NICORETTE) 2 MG gum Take 2 mg by mouth every 4 (four) hours as needed for smoking cessation.    . predniSONE (DELTASONE) 10 MG tablet 3 tabs by mouth per day for 3 days,2tabs per day for 3 days,1tab per day for 3 days 18 tablet 0  . rosuvastatin (CRESTOR) 40 MG tablet TAKE 1 TABLET BY MOUTH EVERY DAY 90 tablet 0  . traMADol (ULTRAM) 50 MG tablet TAKE 1 TABLET BY MOUTH TWICE A DAY 60 tablet 2   No current facility-administered medications on file prior to visit.   Review of Systems All otherwise neg per pt     Objective:   Physical Exam BP 140/80 (BP Location: Left Arm, Patient Position: Sitting, Cuff Size: Large)   Pulse 67   Temp 98.5 F (36.9 C) (Oral)   Ht 5\' 10"  (1.778 m)   Wt 191 lb (86.6 kg)   SpO2 96%   BMI 27.41 kg/m  VS noted,  Constitutional: Pt appears in NAD HENT: Head: NCAT.  Right Ear: External ear normal.  Left Ear: External ear normal.  Eyes: . Pupils are equal, round, and reactive to light. Conjunctivae and EOM are normal Nose: without d/c or deformity Neck: Neck supple. Gross normal ROM Cardiovascular: Normal rate and regular rhythm.   Pulmonary/Chest: Effort normal and breath sounds without rales or wheezing.  Abd:  Soft, NT, ND, + BS, no organomegaly Neurological: Pt is alert. At baseline orientation, motor grossly intact Skin: Skin is warm. No rashes, other new lesions, no LE edema except for left lateral mid thigh withlarge area 8 x 4 cm abrasion with mid area 1.5 cm sub hematoma Psychiatric: Pt behavior is normal without agitation  All otherwise neg per pt  Lab Results  Component Value Date   WBC 6.4 05/09/2019   HGB 16.7 05/09/2019   HCT 48.5 05/09/2019   PLT 148.0 (L) 05/09/2019    GLUCOSE 101 (H) 11/18/2019   CHOL 166 11/18/2019   TRIG 305.0 (H) 11/18/2019   HDL 45.90 11/18/2019   LDLDIRECT 78.0 11/18/2019   LDLCALC 125 09/08/2016   ALT 33 11/18/2019   AST 26 11/18/2019   NA 135 11/18/2019   K 4.7 11/18/2019   CL 103 11/18/2019   CREATININE 0.79 11/18/2019   BUN 9 11/18/2019   CO2 28 11/18/2019   TSH 1.60 05/09/2019   PSA 0.30 05/09/2019   INR 0.91 01/06/2017   HGBA1C 6.2 11/18/2019   MICROALBUR 1.3 05/09/2019       Assessment & Plan:

## 2020-05-20 NOTE — Assessment & Plan Note (Addendum)
Worsening recent, cont same tx, refer personal counseling

## 2020-05-20 NOTE — Assessment & Plan Note (Addendum)
Mild, d/w pt, reassured, d/w pt natural history, ok to follow, tylenol prn  I spent 31 minutes in addition to time for CPX wellness examination in preparing to see the patient by review of recent labs, imaging and procedures, obtaining and reviewing separately obtained history, communicating with the patient and family or caregiver, ordering medications, tests or procedures, and documenting clinical information in the EHR including the differential Dx, treatment, and any further evaluation and other management of hematoma, insomnia, adjustment d/o, marital difficulties, DM, chronic LBP,

## 2020-05-20 NOTE — Assessment & Plan Note (Signed)
For marital counseling as above as well to continue

## 2020-05-20 NOTE — Patient Instructions (Addendum)
Ok to stop the metformin  You will be contacted regarding the referral for: counseling  Please take all new medication as prescribed - the lunesta for sleep  Please continue all other medications as before, and refills have been done if requested.  Please have the pharmacy call with any other refills you may need.  Please continue your efforts at being more active, low cholesterol diet, and weight control.  You are otherwise up to date with prevention measures today.  Please keep your appointments with your specialists as you may have planned  Please go to the LAB at the blood drawing area for the tests to be done at your convenience at the first floor when you can  You will be contacted by phone if any changes need to be made immediately.  Otherwise, you will receive a letter about your results with an explanation, but please check with MyChart first.  Please remember to sign up for MyChart if you have not done so, as this will be important to you in the future with finding out test results, communicating by private email, and scheduling acute appointments online when needed.  Please make an Appointment to return in 6 months, or sooner if needed

## 2020-06-24 IMAGING — DX DG WRIST COMPLETE 3+V*L*
4 series · 4 of 4 positions shown · non-contrast
Comparison: 08/09/2018

CLINICAL DATA: Hyperextension 3 days ago with pain and swelling,
initial encounter

EXAM:
LEFT WRIST - COMPLETE 3+ VIEW

[wrist pa]
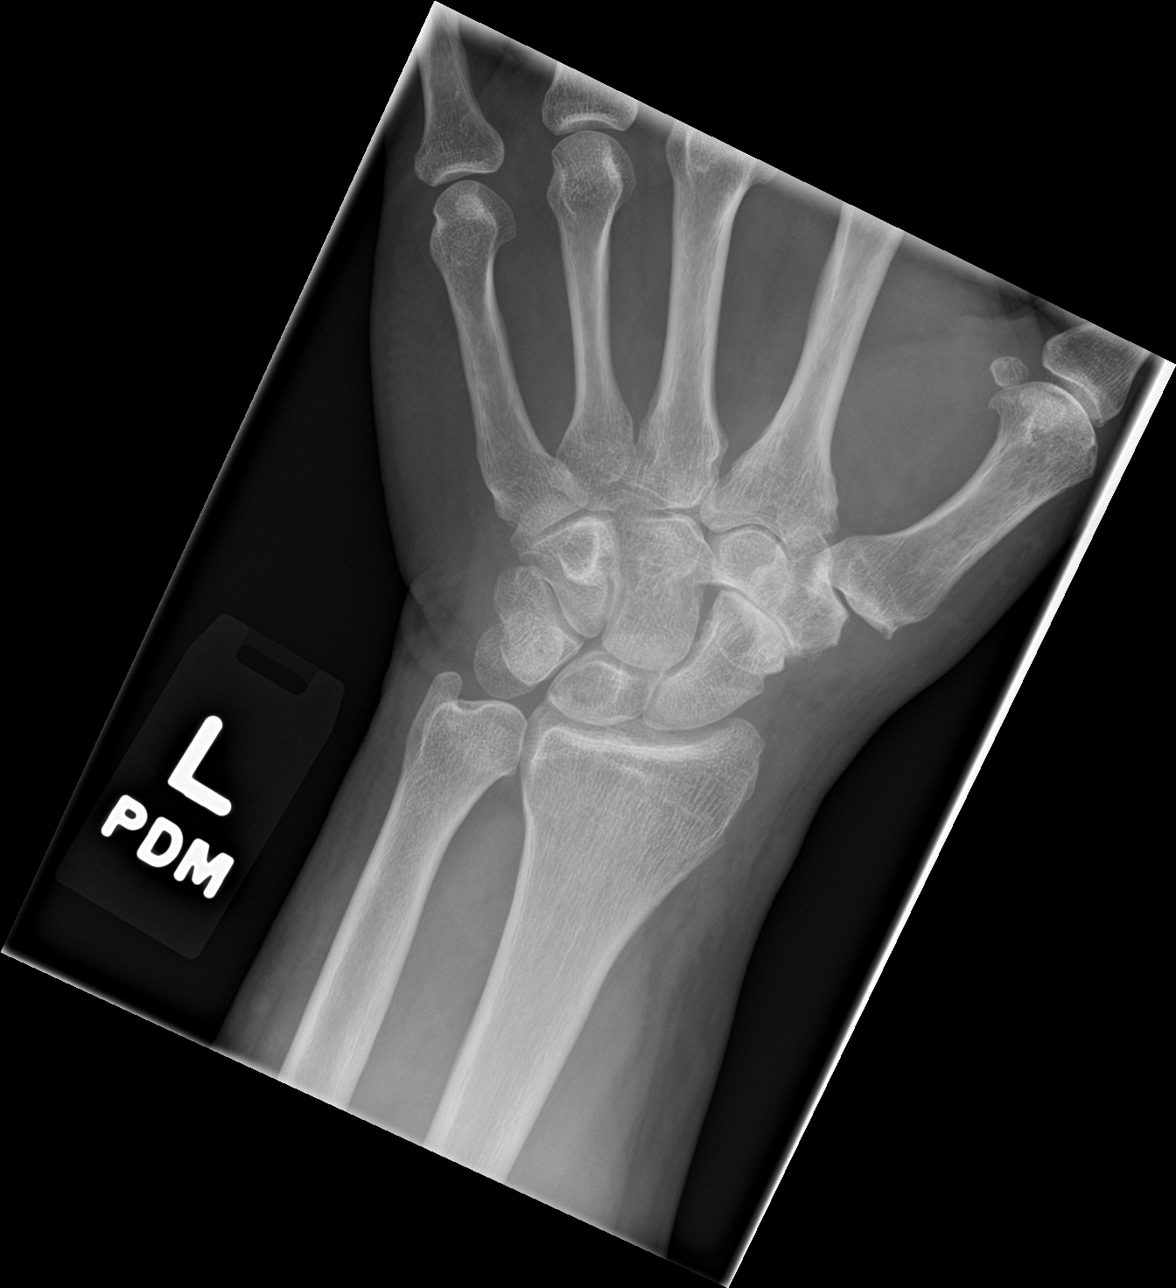

[wrist obl]
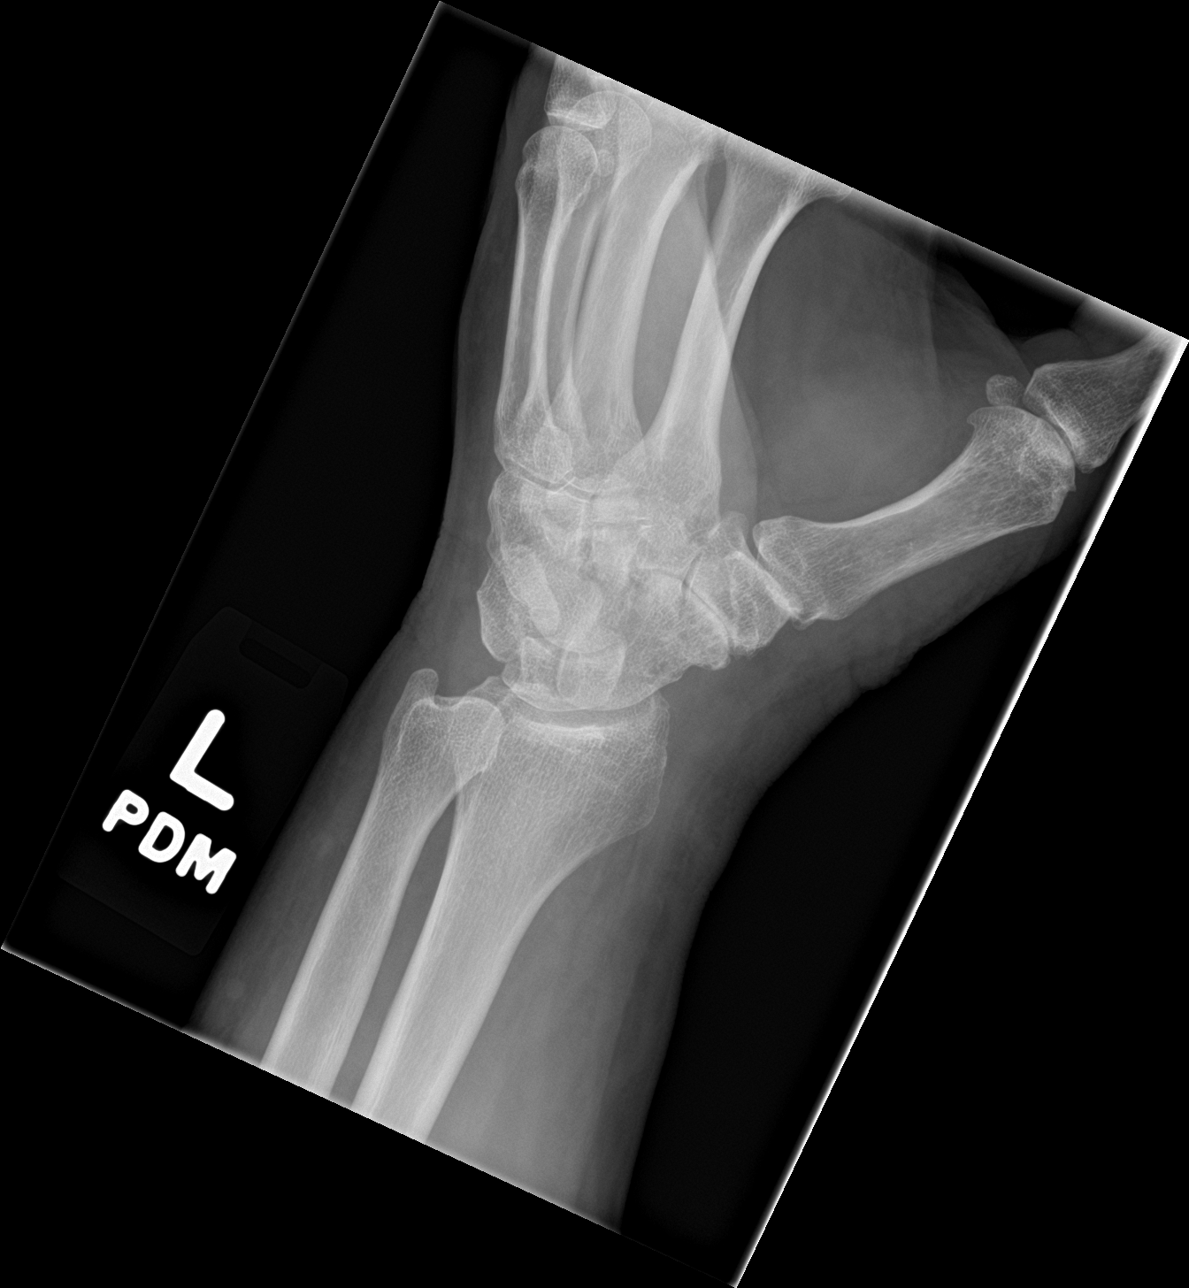

[wrist lat]
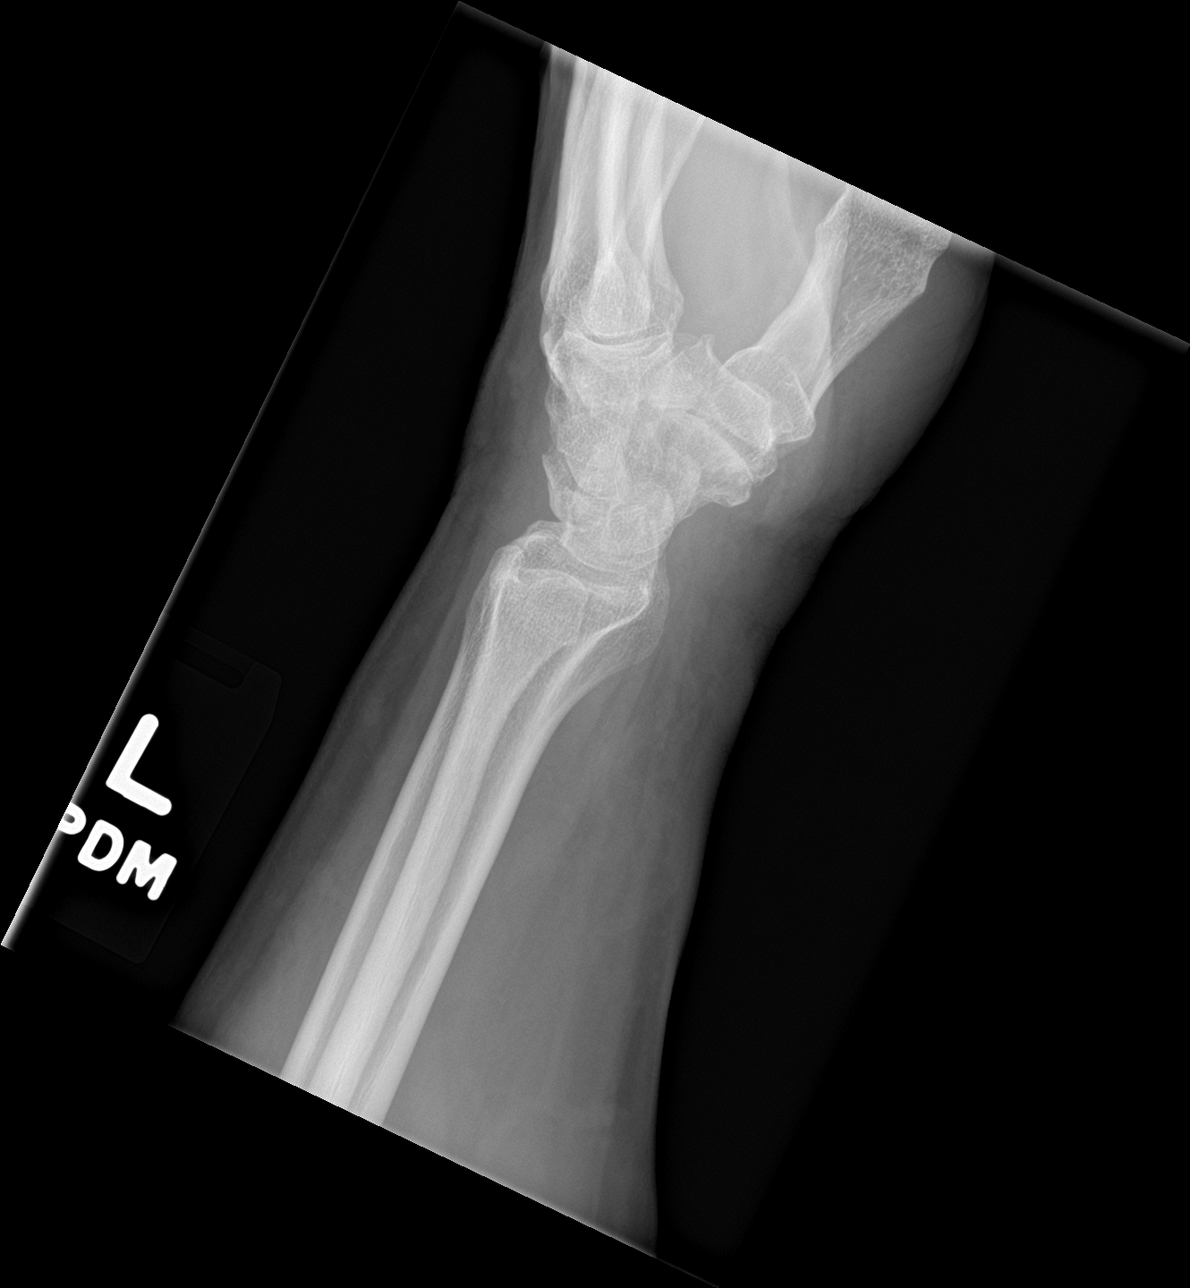

[navicular]
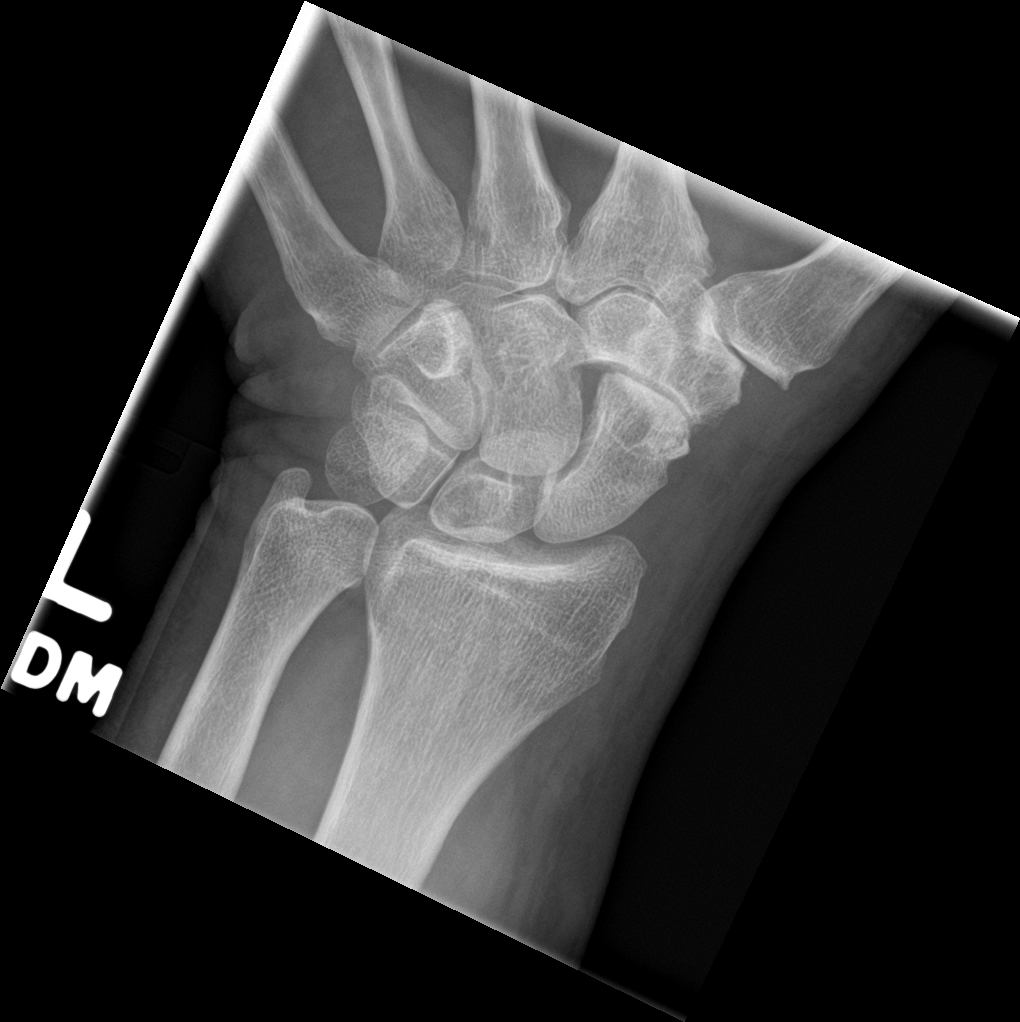

[4 of 4 positions shown; findings below may reference images not displayed]

FINDINGS: Degenerative changes are again noted at the first [facility] gym went as
well as within the carpal rows predominately laterally. No acute
fracture or dislocation is seen. No gross soft tissue abnormality is
noted.
IMPRESSION: Stable degenerative change without acute abnormality.

## 2020-07-07 ENCOUNTER — Other Ambulatory Visit: Payer: Self-pay

## 2020-07-07 ENCOUNTER — Other Ambulatory Visit: Payer: BC Managed Care – PPO

## 2020-07-07 DIAGNOSIS — Z20822 Contact with and (suspected) exposure to covid-19: Secondary | ICD-10-CM | POA: Diagnosis not present

## 2020-07-08 LAB — NOVEL CORONAVIRUS, NAA: SARS-CoV-2, NAA: NOT DETECTED

## 2020-07-08 LAB — SARS-COV-2, NAA 2 DAY TAT

## 2020-08-05 ENCOUNTER — Other Ambulatory Visit: Payer: Self-pay | Admitting: Internal Medicine

## 2020-08-05 DIAGNOSIS — M5010 Cervical disc disorder with radiculopathy, unspecified cervical region: Secondary | ICD-10-CM

## 2020-08-05 NOTE — Telephone Encounter (Signed)
Done erx 

## 2020-08-09 ENCOUNTER — Other Ambulatory Visit: Payer: Self-pay | Admitting: Cardiology

## 2020-08-09 DIAGNOSIS — E785 Hyperlipidemia, unspecified: Secondary | ICD-10-CM

## 2020-08-12 ENCOUNTER — Other Ambulatory Visit: Payer: Self-pay | Admitting: Cardiology

## 2020-08-12 NOTE — Telephone Encounter (Signed)
Please contact pt for future appointment pt due for 12 month f/u.  

## 2020-08-30 ENCOUNTER — Encounter: Payer: Self-pay | Admitting: Internal Medicine

## 2020-08-31 MED ORDER — PREDNISONE 10 MG PO TABS: ORAL_TABLET | ORAL | 0 refills | Status: DC

## 2020-09-08 ENCOUNTER — Encounter: Payer: Self-pay | Admitting: Internal Medicine

## 2020-10-08 ENCOUNTER — Ambulatory Visit: Payer: BC Managed Care – PPO | Admitting: Cardiology

## 2020-10-13 ENCOUNTER — Other Ambulatory Visit: Payer: Self-pay

## 2020-10-14 ENCOUNTER — Ambulatory Visit: Payer: BC Managed Care – PPO | Admitting: Internal Medicine

## 2020-10-14 ENCOUNTER — Encounter: Payer: Self-pay | Admitting: Internal Medicine

## 2020-10-14 VITALS — BP 130/80 | HR 83 | Temp 98.1°F | Ht 70.0 in | Wt 173.0 lb

## 2020-10-14 DIAGNOSIS — F419 Anxiety disorder, unspecified: Secondary | ICD-10-CM | POA: Diagnosis not present

## 2020-10-14 DIAGNOSIS — F4321 Adjustment disorder with depressed mood: Secondary | ICD-10-CM | POA: Diagnosis not present

## 2020-10-14 DIAGNOSIS — G4700 Insomnia, unspecified: Secondary | ICD-10-CM

## 2020-10-14 DIAGNOSIS — I10 Essential (primary) hypertension: Secondary | ICD-10-CM

## 2020-10-14 DIAGNOSIS — R634 Abnormal weight loss: Secondary | ICD-10-CM

## 2020-10-14 MED ORDER — LORAZEPAM 1 MG PO TABS: 1.0000 mg | ORAL_TABLET | Freq: Every day | ORAL | 2 refills | Status: DC | PRN
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
## 2020-10-14 NOTE — Patient Instructions (Addendum)
Please take all new medication as prescribed - the ativan only as needed  Please continue all other medications as before, including the lexapro and lunesta  You will be contacted regarding the referral for: Counseling (psychology)  Please have the pharmacy call with any other refills you may need.  Please continue your efforts at being more active, low cholesterol diet, and weight control.  Please keep your appointments with your specialists as you may have planned  Please make an Appointment to return in 3 months

## 2020-10-15 ENCOUNTER — Encounter: Payer: Self-pay | Admitting: Internal Medicine

## 2020-10-15 DIAGNOSIS — R634 Abnormal weight loss: Secondary | ICD-10-CM | POA: Insufficient documentation

## 2020-10-15 NOTE — Assessment & Plan Note (Deleted)
With early satiety, likely emotional related, declines GI referral

## 2020-10-15 NOTE — Assessment & Plan Note (Signed)
Very mild at worst, no SI or HI, o/w stable overall by history and exam, recent data reviewed with pt, and pt to continue medical treatment as before,  to f/u any worsening symptoms or concerns

## 2020-10-15 NOTE — Assessment & Plan Note (Addendum)
Uncontrolled with recent worsening panic, for counseling re-referral, atvain 1 mg prn,  to f/u any worsening symptoms or concerns  I spent 41 minutes in preparing to see the patient by review of recent labs, imaging and procedures, obtaining and reviewing separately obtained history, communicating with the patient and family or caregiver, ordering medications, tests or procedures, and documenting clinical information in the EHR including the differential Dx, treatment, and any further evaluation and other management of anxiety, depression, insomnia, wt los, htn

## 2020-10-15 NOTE — Assessment & Plan Note (Signed)
With early satiety, likely emotional related, declines GI referral

## 2020-10-15 NOTE — Assessment & Plan Note (Signed)
stable overall by history and exam, recent data reviewed with pt, and pt to continue medical treatment as before,  to f/u any worsening symptoms or concerns  

## 2020-10-28 ENCOUNTER — Other Ambulatory Visit: Payer: Self-pay | Admitting: Internal Medicine

## 2020-10-28 DIAGNOSIS — M5010 Cervical disc disorder with radiculopathy, unspecified cervical region: Secondary | ICD-10-CM

## 2020-11-09 ENCOUNTER — Other Ambulatory Visit: Payer: Self-pay | Admitting: Internal Medicine

## 2020-11-09 DIAGNOSIS — F419 Anxiety disorder, unspecified: Secondary | ICD-10-CM

## 2020-11-11 ENCOUNTER — Other Ambulatory Visit: Payer: Self-pay | Admitting: Internal Medicine

## 2020-11-11 DIAGNOSIS — M5010 Cervical disc disorder with radiculopathy, unspecified cervical region: Secondary | ICD-10-CM

## 2020-11-13 ENCOUNTER — Other Ambulatory Visit: Payer: Self-pay | Admitting: Internal Medicine

## 2020-11-17 ENCOUNTER — Ambulatory Visit (INDEPENDENT_AMBULATORY_CARE_PROVIDER_SITE_OTHER): Payer: BC Managed Care – PPO | Admitting: Psychology

## 2020-11-17 DIAGNOSIS — F4323 Adjustment disorder with mixed anxiety and depressed mood: Secondary | ICD-10-CM | POA: Diagnosis not present

## 2020-11-24 ENCOUNTER — Ambulatory Visit: Payer: Self-pay | Admitting: Psychology

## 2020-12-14 ENCOUNTER — Ambulatory Visit (INDEPENDENT_AMBULATORY_CARE_PROVIDER_SITE_OTHER): Payer: 59 | Admitting: Psychology

## 2020-12-14 DIAGNOSIS — F4323 Adjustment disorder with mixed anxiety and depressed mood: Secondary | ICD-10-CM | POA: Diagnosis not present

## 2020-12-30 NOTE — Progress Notes (Deleted)
HPI: FUhyperlipidemia and hypertension. Nuclear study September 2003 showed ejection fraction 72% and normal perfusion. Echocardiogram September 2016 showed normal LV systolic function and grade 1 diastolic dysfunction. Pt has had left carotid endarterectomy. Carotid Dopplers October 2020 showed 1 to 39% bilateral stenosis. Since last seen,  Current Outpatient Medications  Medication Sig Dispense Refill  . gabapentin (NEURONTIN) 300 MG capsule TAKE 1 CAPSULE BY MOUTH EVERYDAY AT BEDTIME 90 capsule 1  . acetaminophen (TYLENOL) 500 MG tablet Take 1,000 mg by mouth every 8 (eight) hours as needed for moderate pain.     Marland Kitchen aspirin EC 81 MG tablet Take 1 tablet (81 mg total) by mouth daily. (Patient taking differently: Take 81 mg by mouth at bedtime. ) 90 tablet 3  . escitalopram (LEXAPRO) 20 MG tablet TAKE 1 TABLET BY MOUTH EVERY DAY 90 tablet 3  . esomeprazole (NEXIUM) 40 MG capsule Take 40 mg by mouth daily with breakfast.     . eszopiclone (LUNESTA) 2 MG TABS tablet TAKE 1 TABLET BY MOUTH EVERY DAY IMMEDIATELY BEFORE BEDTIME AS NEEDED FOR SLEEP 90 tablet 1  . ezetimibe (ZETIA) 10 MG tablet Take 1 tablet (10 mg total) by mouth daily. 90 tablet 3  . glucose blood (ONETOUCH VERIO) test strip Use as instructed daily E11.9 100 each 12  . Lancets MISC Use as directed daily E11.9 100 each 3  . loratadine (CLARITIN) 10 MG tablet Take 10 mg by mouth daily.     Marland Kitchen LORazepam (ATIVAN) 1 MG tablet Take 1 tablet (1 mg total) by mouth daily as needed for anxiety. 30 tablet 2  . losartan (COZAAR) 100 MG tablet TAKE 1 TABLET BY MOUTH EVERY DAY 90 tablet 3  . Multiple Vitamin (MULTIVITAMIN WITH MINERALS) TABS tablet Take 1 tablet by mouth daily.    . nicotine polacrilex (NICORETTE) 2 MG gum Take 2 mg by mouth every 4 (four) hours as needed for smoking cessation.    . predniSONE (DELTASONE) 10 MG tablet 3 tabs by mouth per day for 3 days,2tabs per day for 3 days,1tab per day for 3 days 18 tablet 0  .  rosuvastatin (CRESTOR) 40 MG tablet TAKE 1 TABLET BY MOUTH EVERY DAY 90 tablet 0  . traMADol (ULTRAM) 50 MG tablet TAKE 1 TABLET BY MOUTH TWICE A DAY 60 tablet 2   No current facility-administered medications for this visit.     Past Medical History:  Diagnosis Date  . Allergy   . Carotid artery occlusion   . Environmental and seasonal allergies   . GERD (gastroesophageal reflux disease)   . Heart palpitations   . History of Holter monitoring   . Hyperlipidemia   . Hypertension    does not take meds  . Nerve pain    neck; takes gabapentin  . Sleep apnea    wears CPAP nightly    Past Surgical History:  Procedure Laterality Date  . dental implants    . ENDARTERECTOMY Left 01/12/2017   Procedure: Left Carotid ENDARTERECTOMY;  Surgeon: Conrad [place], MD;  Location: [facility];  Service: Vascular;  Laterality: Left;  . INGUINAL HERNIA REPAIR Left 08/25/2014   Procedure: LEFT INGUINAL HERNIA REPAIR REMOVAL SPERMATIC CORD MASS;  Surgeon: Jackolyn Confer, MD;  Location: [facility];  Service: General;  Laterality: Left;  . INSERTION OF MESH N/A 08/25/2014   Procedure: INSERTION OF MESH;  Surgeon: Jackolyn Confer, MD;  Location: [facility];  Service: General;  Laterality: N/A;  . PATCH ANGIOPLASTY Left 01/12/2017  Procedure: PATCH ANGIOPLASTY;  Surgeon: Conrad [place], MD;  Location: [facility];  Service: Vascular;  Laterality: Left;  . ROTATOR CUFF REPAIR Left     Social History   Socioeconomic History  . Marital status: Married    Spouse name: Not on file  . Number of children: 2  . Years of education: 26  . Highest education level: Not on file  Occupational History  . Occupation: Geologist, engineering  Tobacco Use  . Smoking status: Current Every Day Smoker    Packs/day: 0.10    Years: 43.00    Pack years: 4.30    Types: Cigarettes  . Smokeless tobacco: Never Used  . Tobacco comment: Down to 1/2 pk per day  Substance and Sexual Activity  . Alcohol use: No  . Drug use: No  . Sexual activity: Not on  file  Other Topics Concern  . Not on file  Social History Narrative   Pt lives with his wife and two children.  Graduated high school.   Social Determinants of Health   Financial Resource Strain: Not on file  Food Insecurity: Not on file  Transportation Needs: Not on file  Physical Activity: Not on file  Stress: Not on file  Social Connections: Not on file  Intimate Partner Violence: Not on file    Family History  Problem Relation Age of Onset  . Stroke Father 45  . Diabetes Father   . Diabetes Paternal Grandmother   . Stroke Paternal Grandfather 22       guess early 24's  . Heart attack Maternal Grandfather     ROS: no fevers or chills, productive cough, hemoptysis, dysphasia, odynophagia, melena, hematochezia, dysuria, hematuria, rash, seizure activity, orthopnea, PND, pedal edema, claudication. Remaining systems are negative.  Physical Exam: Well-developed well-nourished in no acute distress.  Skin is warm and dry.  HEENT is normal.  Neck is supple.  Chest is clear to auscultation with normal expansion.  Cardiovascular exam is regular rate and rhythm.  Abdominal exam nontender or distended. No masses palpated. Extremities show no edema. neuro grossly intact  ECG- personally reviewed  A/P  1 carotid artery disease-we will arrange follow-up carotid Dopplers.  Continue aspirin and statin.  2 hypertension-patient's blood pressure is controlled.  Continue present medications.  Check potassium and renal function.  3 hyperlipidemia-continue Zetia and Crestor.  Check lipids and liver.  4 tobacco abuse-patient counseled on discontinuing.  Kirk Ruths, MD

## 2021-01-04 ENCOUNTER — Ambulatory Visit (INDEPENDENT_AMBULATORY_CARE_PROVIDER_SITE_OTHER): Payer: 59 | Admitting: Psychology

## 2021-01-04 DIAGNOSIS — F4323 Adjustment disorder with mixed anxiety and depressed mood: Secondary | ICD-10-CM | POA: Diagnosis not present

## 2021-01-11 ENCOUNTER — Other Ambulatory Visit: Payer: Self-pay | Admitting: Internal Medicine

## 2021-01-12 ENCOUNTER — Ambulatory Visit: Payer: BC Managed Care – PPO | Admitting: Cardiology

## 2021-01-21 ENCOUNTER — Other Ambulatory Visit: Payer: Self-pay | Admitting: Internal Medicine

## 2021-01-21 DIAGNOSIS — M5010 Cervical disc disorder with radiculopathy, unspecified cervical region: Secondary | ICD-10-CM

## 2021-01-27 ENCOUNTER — Ambulatory Visit: Payer: 59 | Admitting: Psychology

## 2021-02-10 ENCOUNTER — Ambulatory Visit (INDEPENDENT_AMBULATORY_CARE_PROVIDER_SITE_OTHER): Payer: 59 | Admitting: Psychology

## 2021-02-10 DIAGNOSIS — F4310 Post-traumatic stress disorder, unspecified: Secondary | ICD-10-CM | POA: Diagnosis not present

## 2021-02-10 DIAGNOSIS — F410 Panic disorder [episodic paroxysmal anxiety] without agoraphobia: Secondary | ICD-10-CM | POA: Diagnosis not present

## 2021-03-02 ENCOUNTER — Ambulatory Visit (INDEPENDENT_AMBULATORY_CARE_PROVIDER_SITE_OTHER): Payer: 59 | Admitting: Psychology

## 2021-03-02 DIAGNOSIS — F4323 Adjustment disorder with mixed anxiety and depressed mood: Secondary | ICD-10-CM | POA: Diagnosis not present

## 2021-03-23 ENCOUNTER — Ambulatory Visit: Payer: 59 | Admitting: Psychology

## 2021-03-24 NOTE — Progress Notes (Signed)
HPI: FUcarotid artery disease, hyperlipidemia and hypertension. Nuclear study September 2003 showed ejection fraction 72% and normal perfusion. Echocardiogram September 2016 showed normal LV systolic function and grade 1 diastolic dysfunction. Carotid DopplersDecember 2017 showed 80-99% left and 1-39% right stenosis.He underwent carotid endarterectomy and carotid is now followed by vascular surgery.  Carotid Dopplers October 2020 showed 1 to 39% bilateral stenosis. Since last seen, patient denies dyspnea, chest pain, palpitations or syncope.  Current Outpatient Medications  Medication Sig Dispense Refill  . acetaminophen (TYLENOL) 500 MG tablet Take 1,000 mg by mouth every 8 (eight) hours as needed for moderate pain.     Marland Kitchen escitalopram (LEXAPRO) 20 MG tablet TAKE 1 TABLET BY MOUTH EVERY DAY 90 tablet 3  . esomeprazole (NEXIUM) 40 MG capsule Take 40 mg by mouth daily with breakfast.     . eszopiclone (LUNESTA) 2 MG TABS tablet TAKE 1 TABLET BY MOUTH EVERY DAY IMMEDIATELY BEFORE BEDTIME AS NEEDED FOR SLEEP 90 tablet 1  . gabapentin (NEURONTIN) 300 MG capsule TAKE 1 CAPSULE BY MOUTH EVERYDAY AT BEDTIME 90 capsule 1  . loratadine (CLARITIN) 10 MG tablet Take 10 mg by mouth daily.    Marland Kitchen LORazepam (ATIVAN) 1 MG tablet TAKE 1 TABLET BY MOUTH DAILY AS NEEDED FOR ANXIETY 30 tablet 2  . losartan (COZAAR) 100 MG tablet TAKE 1 TABLET BY MOUTH EVERY DAY (Patient taking differently: Patient 1/2 tablet daily) 90 tablet 3  . Multiple Vitamin (MULTIVITAMIN WITH MINERALS) TABS tablet Take 1 tablet by mouth daily.    . traMADol (ULTRAM) 50 MG tablet TAKE 1 TABLET BY MOUTH TWICE A DAY 60 tablet 2  . aspirin EC 81 MG tablet Take 1 tablet (81 mg total) by mouth daily. (Patient not taking: Reported on 04/05/2021) 90 tablet 3  . ezetimibe (ZETIA) 10 MG tablet Take 1 tablet (10 mg total) by mouth daily. (Patient not taking: Reported on 04/05/2021) 90 tablet 3  . glucose blood (ONETOUCH VERIO) test strip Use as  instructed daily E11.9 (Patient not taking: Reported on 04/05/2021) 100 each 12  . Lancets MISC Use as directed daily E11.9 (Patient not taking: Reported on 04/05/2021) 100 each 3  . nicotine polacrilex (NICORETTE) 2 MG gum Take 2 mg by mouth every 4 (four) hours as needed for smoking cessation. (Patient not taking: Reported on 04/05/2021)    . predniSONE (DELTASONE) 10 MG tablet 3 tabs by mouth per day for 3 days,2tabs per day for 3 days,1tab per day for 3 days (Patient not taking: Reported on 04/05/2021) 18 tablet 0  . rosuvastatin (CRESTOR) 40 MG tablet TAKE 1 TABLET BY MOUTH EVERY DAY (Patient not taking: Reported on 04/05/2021) 90 tablet 0   No current facility-administered medications for this visit.     Past Medical History:  Diagnosis Date  . Allergy   . Carotid artery occlusion   . Environmental and seasonal allergies   . GERD (gastroesophageal reflux disease)   . Heart palpitations   . History of Holter monitoring   . Hyperlipidemia   . Hypertension    does not take meds  . Nerve pain    neck; takes gabapentin  . Sleep apnea    wears CPAP nightly    Past Surgical History:  Procedure Laterality Date  . dental implants    . ENDARTERECTOMY Left 01/12/2017   Procedure: Left Carotid ENDARTERECTOMY;  Surgeon: Conrad Monument, MD;  Location: [place];  Service: Vascular;  Laterality: Left;  . INGUINAL HERNIA REPAIR Left  08/25/2014   Procedure: LEFT INGUINAL HERNIA REPAIR REMOVAL SPERMATIC CORD MASS;  Surgeon: Jackolyn Confer, MD;  Location: [place];  Service: General;  Laterality: Left;  . INSERTION OF MESH N/A 08/25/2014   Procedure: INSERTION OF MESH;  Surgeon: Jackolyn Confer, MD;  Location: [place];  Service: General;  Laterality: N/A;  . PATCH ANGIOPLASTY Left 01/12/2017   Procedure: PATCH ANGIOPLASTY;  Surgeon: Conrad Maiden, MD;  Location: [place];  Service: Vascular;  Laterality: Left;  . ROTATOR CUFF REPAIR Left     Social History   Socioeconomic History  . Marital status: Married     Spouse name: Not on file  . Number of children: 2  . Years of education: 47  . Highest education level: Not on file  Occupational History  . Occupation: Geologist, engineering  Tobacco Use  . Smoking status: Current Every Day Smoker    Packs/day: 0.10    Years: 43.00    Pack years: 4.30    Types: Cigarettes  . Smokeless tobacco: Never Used  . Tobacco comment: 3/4 a day  Substance and Sexual Activity  . Alcohol use: No  . Drug use: No  . Sexual activity: Not on file  Other Topics Concern  . Not on file  Social History Narrative   Pt lives with his wife and two children.  Graduated high school.   Social Determinants of Health   Financial Resource Strain: Not on file  Food Insecurity: Not on file  Transportation Needs: Not on file  Physical Activity: Not on file  Stress: Not on file  Social Connections: Not on file  Intimate Partner Violence: Not on file    Family History  Problem Relation Age of Onset  . Stroke Father 71  . Diabetes Father   . Diabetes Paternal Grandmother   . Stroke Paternal Grandfather 76       guess early 27's  . Heart attack Maternal Grandfather     ROS: no fevers or chills, productive cough, hemoptysis, dysphasia, odynophagia, melena, hematochezia, dysuria, hematuria, rash, seizure activity, orthopnea, PND, pedal edema, claudication. Remaining systems are negative.  Physical Exam: Well-developed well-nourished in no acute distress.  Skin is warm and dry.  HEENT is normal.  Neck is supple.  Chest is clear to auscultation with normal expansion.  Cardiovascular exam is regular rate and rhythm.  Abdominal exam nontender or distended. No masses palpated. Extremities show no edema. neuro grossly intact  ECG-normal sinus rhythm at a rate of 84, no ST changes.  Personally reviewed  A/P  1 carotid artery disease-We will arrange follow-up carotid Dopplers.  Resume aspirin 81 mg daily and resume statin.  2 hypertension-blood pressure elevated; change  losartan to 100 mg daily.  Check potassium and renal function.  3 hyperlipidemia-resume Crestor 40 mg daily and Zetia 10 mg daily.  Check lipids and liver in 12 weeks.  4 tobacco abuse-patient counseled on discontinuing.  Kirk Ruths, MD

## 2021-04-05 ENCOUNTER — Other Ambulatory Visit: Payer: Self-pay

## 2021-04-05 ENCOUNTER — Other Ambulatory Visit: Payer: Self-pay | Admitting: Internal Medicine

## 2021-04-05 ENCOUNTER — Ambulatory Visit: Payer: 59 | Admitting: Cardiology

## 2021-04-05 ENCOUNTER — Encounter: Payer: Self-pay | Admitting: Cardiology

## 2021-04-05 VITALS — BP 162/82 | HR 84 | Ht 70.0 in | Wt 180.6 lb

## 2021-04-05 DIAGNOSIS — E785 Hyperlipidemia, unspecified: Secondary | ICD-10-CM

## 2021-04-05 DIAGNOSIS — I10 Essential (primary) hypertension: Secondary | ICD-10-CM | POA: Diagnosis not present

## 2021-04-05 DIAGNOSIS — I6523 Occlusion and stenosis of bilateral carotid arteries: Secondary | ICD-10-CM

## 2021-04-05 MED ORDER — ROSUVASTATIN CALCIUM 40 MG PO TABS: 40.0000 mg | ORAL_TABLET | Freq: Every day | ORAL | 3 refills | Status: DC

## 2021-04-05 MED ORDER — ASPIRIN EC 81 MG PO TBEC: 81.0000 mg | DELAYED_RELEASE_TABLET | Freq: Every day | ORAL | 3 refills | Status: DC

## 2021-04-05 MED ORDER — LOSARTAN POTASSIUM 100 MG PO TABS: 1.0000 | ORAL_TABLET | Freq: Every day | ORAL | 3 refills | Status: DC

## 2021-04-05 MED ORDER — EZETIMIBE 10 MG PO TABS: 10.0000 mg | ORAL_TABLET | Freq: Every day | ORAL | 3 refills | Status: DC

## 2021-04-05 NOTE — Patient Instructions (Signed)
Medication Instructions:   START ASPIRIN 81 MG ONCE DAILY  START ROSUVASTATIN 40 MG ONCE DAILY   START EZETIMIBE 10 MG ONCE DAILY  START LOSARTAN 100 MG ONCE DAILY  *If you need a refill on your cardiac medications before your next appointment, please call your pharmacy*   Lab Work:  Your physician recommends that you return for lab work in: [place]  If you have labs (blood work) drawn today and your tests are completely normal, you will receive your results only by: Marland Kitchen MyChart Message (if you have MyChart) OR . A paper copy in the mail If you have any lab test that is abnormal or we need to change your treatment, we will call you to review the results.   Testing/Procedures:  Your physician has requested that you have a carotid duplex. This test is an ultrasound of the carotid arteries in your neck. It looks at blood flow through these arteries that supply the brain with blood. Allow one hour for this exam. There are no restrictions or special instructions.[facility]     Follow-Up: At [facility], you and your health needs are our priority.  As part of our continuing mission to provide you with exceptional heart care, we have created designated Provider Care Teams.  These Care Teams include your primary Cardiologist (physician) and Advanced Practice Providers (APPs -  Physician Assistants and Nurse Practitioners) who all work together to provide you with the care you need, when you need it.  We recommend signing up for the patient portal called "MyChart".  Sign up information is provided on this After Visit Summary.  MyChart is used to connect with patients for Virtual Visits (Telemedicine).  Patients are able to view lab/test results, encounter notes, upcoming appointments, etc.  Non-urgent messages can be sent to your provider as well.   To learn more about what you can do with MyChart, go to [email or web address].    Your next appointment:   12  month(s)  The format for your next appointment:   In Person  Provider:   Kirk Ruths, MD

## 2021-04-05 NOTE — Telephone Encounter (Signed)
Patient is requesting a refill of the following medications: Requested Prescriptions   Pending Prescriptions Disp Refills  . LORazepam (ATIVAN) 1 MG tablet [Pharmacy Med Name: LORAZEPAM 1 MG TABLET] 30 tablet 2    Sig: TAKE 1 TABLET BY MOUTH EVERY DAY AS NEEDED FOR ANXIETY    Date of patient request: 04/05/21  Last office visit: 10/14/20 Date of last refill: 01/11/21  Last refill amount: 30,2   Follow up time period per chart: n/a

## 2021-04-20 ENCOUNTER — Other Ambulatory Visit: Payer: Self-pay | Admitting: Internal Medicine

## 2021-04-20 ENCOUNTER — Inpatient Hospital Stay (HOSPITAL_COMMUNITY): Admission: RE | Admit: 2021-04-20 | Payer: 59 | Source: Ambulatory Visit

## 2021-04-20 DIAGNOSIS — M5010 Cervical disc disorder with radiculopathy, unspecified cervical region: Secondary | ICD-10-CM

## 2021-04-28 ENCOUNTER — Other Ambulatory Visit: Payer: Self-pay | Admitting: Internal Medicine

## 2021-04-28 DIAGNOSIS — M5010 Cervical disc disorder with radiculopathy, unspecified cervical region: Secondary | ICD-10-CM

## 2021-05-10 ENCOUNTER — Ambulatory Visit (HOSPITAL_COMMUNITY): Admission: RE | Admit: 2021-05-10 | Payer: 59 | Source: Ambulatory Visit | Attending: Cardiology | Admitting: Cardiology

## 2021-05-18 ENCOUNTER — Other Ambulatory Visit: Payer: Self-pay | Admitting: Internal Medicine

## 2021-05-25 ENCOUNTER — Other Ambulatory Visit: Payer: Self-pay | Admitting: Internal Medicine

## 2021-06-30 ENCOUNTER — Other Ambulatory Visit: Payer: Self-pay | Admitting: Internal Medicine

## 2021-07-13 ENCOUNTER — Other Ambulatory Visit: Payer: Self-pay | Admitting: Internal Medicine

## 2021-07-13 DIAGNOSIS — M5010 Cervical disc disorder with radiculopathy, unspecified cervical region: Secondary | ICD-10-CM

## 2021-08-18 ENCOUNTER — Encounter: Payer: Self-pay | Admitting: *Deleted

## 2021-08-21 ENCOUNTER — Encounter: Payer: Self-pay | Admitting: Internal Medicine

## 2021-08-21 DIAGNOSIS — Z630 Problems in relationship with spouse or partner: Secondary | ICD-10-CM

## 2021-08-21 DIAGNOSIS — F4323 Adjustment disorder with mixed anxiety and depressed mood: Secondary | ICD-10-CM

## 2021-08-21 DIAGNOSIS — F4321 Adjustment disorder with depressed mood: Secondary | ICD-10-CM

## 2021-09-07 MED ORDER — BUPROPION HCL ER (XL) 150 MG PO TB24: 150.0000 mg | ORAL_TABLET | Freq: Every day | ORAL | 3 refills | Status: DC

## 2021-09-07 NOTE — Addendum Note (Signed)
Addended by: Biagio Borg on: 09/07/2021 01:11 PM   Modules accepted: Orders

## 2021-09-11 ENCOUNTER — Other Ambulatory Visit: Payer: Self-pay | Admitting: Internal Medicine

## 2021-09-11 DIAGNOSIS — M5010 Cervical disc disorder with radiculopathy, unspecified cervical region: Secondary | ICD-10-CM

## 2021-09-24 ENCOUNTER — Other Ambulatory Visit: Payer: Self-pay | Admitting: Internal Medicine

## 2021-09-29 ENCOUNTER — Other Ambulatory Visit: Payer: Self-pay | Admitting: Internal Medicine

## 2021-09-29 DIAGNOSIS — F419 Anxiety disorder, unspecified: Secondary | ICD-10-CM

## 2021-09-29 NOTE — Telephone Encounter (Signed)
Ok to contact pt  Lexapro  refill done for 1 mo due to office refill policy  Please make ROV for further refills

## 2021-10-08 ENCOUNTER — Other Ambulatory Visit: Payer: Self-pay | Admitting: Internal Medicine

## 2021-10-08 DIAGNOSIS — M5010 Cervical disc disorder with radiculopathy, unspecified cervical region: Secondary | ICD-10-CM

## 2021-10-08 MED ORDER — TRAMADOL HCL 50 MG PO TABS: 50.0000 mg | ORAL_TABLET | Freq: Two times a day (BID) | ORAL | 0 refills | Status: DC

## 2021-10-08 NOTE — Telephone Encounter (Signed)
Please to contact pt  Tramadol refilled but please for ROV for further refills

## 2021-10-23 ENCOUNTER — Other Ambulatory Visit: Payer: Self-pay | Admitting: Internal Medicine

## 2021-10-23 DIAGNOSIS — F419 Anxiety disorder, unspecified: Secondary | ICD-10-CM

## 2021-10-24 NOTE — Telephone Encounter (Signed)
Please to contact pt  Lexapro refilled x 1 mo  Please to make rov for further refills

## 2021-11-02 ENCOUNTER — Other Ambulatory Visit: Payer: Self-pay

## 2021-11-02 ENCOUNTER — Ambulatory Visit (INDEPENDENT_AMBULATORY_CARE_PROVIDER_SITE_OTHER): Payer: 59 | Admitting: Internal Medicine

## 2021-11-02 VITALS — BP 120/72 | HR 86 | Temp 97.7°F | Wt 184.6 lb

## 2021-11-02 DIAGNOSIS — F4321 Adjustment disorder with depressed mood: Secondary | ICD-10-CM

## 2021-11-02 DIAGNOSIS — E119 Type 2 diabetes mellitus without complications: Secondary | ICD-10-CM | POA: Diagnosis not present

## 2021-11-02 DIAGNOSIS — I10 Essential (primary) hypertension: Secondary | ICD-10-CM

## 2021-11-02 DIAGNOSIS — E538 Deficiency of other specified B group vitamins: Secondary | ICD-10-CM

## 2021-11-02 DIAGNOSIS — E559 Vitamin D deficiency, unspecified: Secondary | ICD-10-CM | POA: Diagnosis not present

## 2021-11-02 DIAGNOSIS — Z0001 Encounter for general adult medical examination with abnormal findings: Secondary | ICD-10-CM

## 2021-11-02 DIAGNOSIS — F4323 Adjustment disorder with mixed anxiety and depressed mood: Secondary | ICD-10-CM | POA: Diagnosis not present

## 2021-11-02 DIAGNOSIS — M5010 Cervical disc disorder with radiculopathy, unspecified cervical region: Secondary | ICD-10-CM

## 2021-11-02 DIAGNOSIS — Z23 Encounter for immunization: Secondary | ICD-10-CM | POA: Diagnosis not present

## 2021-11-02 DIAGNOSIS — F419 Anxiety disorder, unspecified: Secondary | ICD-10-CM

## 2021-11-02 DIAGNOSIS — Z630 Problems in relationship with spouse or partner: Secondary | ICD-10-CM

## 2021-11-02 DIAGNOSIS — E785 Hyperlipidemia, unspecified: Secondary | ICD-10-CM

## 2021-11-02 LAB — CBC WITH DIFFERENTIAL/PLATELET
Basophils Absolute: 0 10*3/uL (ref 0.0–0.1)
Basophils Relative: 0.3 % (ref 0.0–3.0)
Eosinophils Absolute: 0.1 10*3/uL (ref 0.0–0.7)
Eosinophils Relative: 1.6 % (ref 0.0–5.0)
HCT: 49.3 % (ref 39.0–52.0)
Hemoglobin: 16.4 g/dL (ref 13.0–17.0)
Lymphocytes Relative: 29.9 % (ref 12.0–46.0)
Lymphs Abs: 2.2 10*3/uL (ref 0.7–4.0)
MCHC: 33.2 g/dL (ref 30.0–36.0)
MCV: 89.2 fl (ref 78.0–100.0)
Monocytes Absolute: 0.7 10*3/uL (ref 0.1–1.0)
Monocytes Relative: 9.3 % (ref 3.0–12.0)
Neutro Abs: 4.4 10*3/uL (ref 1.4–7.7)
Neutrophils Relative %: 58.9 % (ref 43.0–77.0)
Platelets: 213 10*3/uL (ref 150.0–400.0)
RBC: 5.52 Mil/uL (ref 4.22–5.81)
RDW: 13 % (ref 11.5–15.5)
WBC: 7.4 10*3/uL (ref 4.0–10.5)

## 2021-11-02 LAB — BASIC METABOLIC PANEL
BUN: 10 mg/dL (ref 6–23)
CO2: 31 mEq/L (ref 19–32)
Calcium: 9.1 mg/dL (ref 8.4–10.5)
Chloride: 102 mEq/L (ref 96–112)
Creatinine, Ser: 0.88 mg/dL (ref 0.40–1.50)
GFR: 92.06 mL/min (ref >60.00)
Glucose, Bld: 97 mg/dL (ref 70–99)
Potassium: 4.5 mEq/L (ref 3.5–5.1)
Sodium: 136 mEq/L (ref 135–145)

## 2021-11-02 LAB — LIPID PANEL
Cholesterol: 292 mg/dL — ABNORMAL HIGH (ref 0–200)
HDL: 43.8 mg/dL (ref >39.00)
NonHDL: 247.77
Total CHOL/HDL Ratio: 7
Triglycerides: 281 mg/dL — ABNORMAL HIGH (ref 0.0–149.0)
VLDL: 56.2 mg/dL — ABNORMAL HIGH (ref 0.0–40.0)

## 2021-11-02 LAB — HEPATIC FUNCTION PANEL
ALT: 18 U/L (ref 0–53)
AST: 15 U/L (ref 0–37)
Albumin: 3.4 g/dL — ABNORMAL LOW (ref 3.5–5.2)
Alkaline Phosphatase: 65 U/L (ref 39–117)
Bilirubin, Direct: 0.1 mg/dL (ref 0.0–0.3)
Total Bilirubin: 0.6 mg/dL (ref 0.2–1.2)
Total Protein: 5.7 g/dL — ABNORMAL LOW (ref 6.0–8.3)

## 2021-11-02 LAB — TSH: TSH: 1.56 u[IU]/mL (ref 0.35–5.50)

## 2021-11-02 LAB — HEMOGLOBIN A1C: Hgb A1c MFr Bld: 6.3 % (ref 4.6–6.5)

## 2021-11-02 LAB — VITAMIN D 25 HYDROXY (VIT D DEFICIENCY, FRACTURES): VITD: 44.22 ng/mL (ref 30.00–100.00)

## 2021-11-02 LAB — LDL CHOLESTEROL, DIRECT: Direct LDL: 230 mg/dL

## 2021-11-02 LAB — PSA: PSA: 0.67 ng/mL (ref 0.10–4.00)

## 2021-11-02 LAB — VITAMIN B12: Vitamin B-12: 419 pg/mL (ref 211–911)

## 2021-11-02 MED ORDER — GABAPENTIN 300 MG PO CAPS: ORAL_CAPSULE | ORAL | 1 refills | Status: DC

## 2021-11-02 MED ORDER — TRAMADOL HCL 50 MG PO TABS: 50.0000 mg | ORAL_TABLET | Freq: Two times a day (BID) | ORAL | 2 refills | Status: DC

## 2021-11-02 MED ORDER — ESCITALOPRAM OXALATE 20 MG PO TABS: 20.0000 mg | ORAL_TABLET | Freq: Every day | ORAL | 3 refills | Status: DC

## 2021-11-02 MED ORDER — BUPROPION HCL ER (XL) 150 MG PO TB24: 150.0000 mg | ORAL_TABLET | Freq: Every day | ORAL | 3 refills | Status: DC

## 2021-11-02 MED ORDER — ESZOPICLONE 2 MG PO TABS: ORAL_TABLET | ORAL | 1 refills | Status: DC

## 2021-11-02 NOTE — Progress Notes (Signed)
Patient ID: Tommy May, male   DOB: 15-Nov-1961, 60 y.o.   MRN: [id number]         Chief Complaint:: wellness exam and depression, dm, htn, hld       HPI:  Tommy May is a 60 y.o. male here for wellness exam; due for optho appt, declines covid vax, flu shot, shingrix, cologuard, ok for prevnar, o/w up to date                        Also now divorced, kids grown for the most part; but worsening depressive symtpoms no SI or HI, wellbutrin helps but cant get to work on time most days, seems to zone out a lot of the day, sometimes misses meds, asks for refrerral for counseling.  Pt denies chest pain, increased sob or doe, wheezing, orthopnea, PND, increased LE swelling, palpitations, dizziness or syncope.   Pt denies polydipsia, polyuria, or new focal neuro s/s.    Pt denies fever, wt loss, night sweats, loss of appetite, or other constitutional symptoms  No worsening neck or arm pain Wt Readings from Last 3 Encounters:  11/02/21 184 lb 9.6 oz (83.7 kg)  04/05/21 180 lb 9.6 oz (81.9 kg)  10/14/20 173 lb (78.5 kg)   BP Readings from Last 3 Encounters:  11/02/21 120/72  04/05/21 (!) 162/82  10/14/20 130/80   Immunization History  Administered Date(s) Administered   Influenza,inj,Quad PF,6+ Mos 08/01/2019   PFIZER(Purple Top)SARS-COV-2 Vaccination 01/25/2020, 02/15/2020   PNEUMOCOCCAL CONJUGATE-20 11/02/2021   Pneumococcal Polysaccharide-23 08/01/2019   Td 01/13/2016   Health Maintenance Due  Topic Date Due   OPHTHALMOLOGY EXAM  Never done      Past Medical History:  Diagnosis Date   Allergy    Carotid artery occlusion    Environmental and seasonal allergies    GERD (gastroesophageal reflux disease)    Heart palpitations    History of Holter monitoring    Hyperlipidemia    Hypertension    does not take meds   Nerve pain    neck; takes gabapentin   Sleep apnea    wears CPAP nightly   Past Surgical History:  Procedure Laterality Date   dental implants      ENDARTERECTOMY Left 01/12/2017   Procedure: Left Carotid ENDARTERECTOMY;  Surgeon: Conrad Odessa, MD;  Location: [place];  Service: Vascular;  Laterality: Left;   INGUINAL HERNIA REPAIR Left 08/25/2014   Procedure: LEFT INGUINAL HERNIA REPAIR REMOVAL SPERMATIC CORD MASS;  Surgeon: Jackolyn Confer, MD;  Location: [place];  Service: General;  Laterality: Left;   INSERTION OF MESH N/A 08/25/2014   Procedure: INSERTION OF MESH;  Surgeon: Jackolyn Confer, MD;  Location: [place];  Service: General;  Laterality: N/A;   PATCH ANGIOPLASTY Left 01/12/2017   Procedure: PATCH ANGIOPLASTY;  Surgeon: Conrad Scipio, MD;  Location: [place];  Service: Vascular;  Laterality: Left;   ROTATOR CUFF REPAIR Left     reports that he has been smoking cigarettes. He has a 4.30 pack-year smoking history. He has never used smokeless tobacco. He reports that he does not drink alcohol and does not use drugs. family history includes Diabetes in his father and paternal grandmother; Heart attack in his maternal grandfather; Stroke (age of onset: 30) in his paternal grandfather; Stroke (age of onset: 77) in his father. Allergies  Allergen Reactions   Atorvastatin Other (See Comments)    MYALGIAS   Current Outpatient Medications on File Prior  to Visit  Medication Sig Dispense Refill   acetaminophen (TYLENOL) 500 MG tablet Take 1,000 mg by mouth every 8 (eight) hours as needed for moderate pain.      esomeprazole (NEXIUM) 40 MG capsule Take 40 mg by mouth daily with breakfast.      loratadine (CLARITIN) 10 MG tablet Take 10 mg by mouth daily.     LORazepam (ATIVAN) 1 MG tablet TAKE 1 TABLET BY MOUTH EVERY DAY AS NEEDED FOR ANXIETY 30 tablet 2   losartan (COZAAR) 100 MG tablet Take 1 tablet (100 mg total) by mouth daily. 90 tablet 3   Multiple Vitamin (MULTIVITAMIN WITH MINERALS) TABS tablet Take 1 tablet by mouth daily.     nicotine polacrilex (NICORETTE) 2 MG gum Take 2 mg by mouth every 4 (four) hours as needed for smoking cessation.      No current facility-administered medications on file prior to visit.        ROS:  All others reviewed and negative.  Objective        PE:  BP 120/72 (BP Location: Left Arm, Patient Position: Sitting, Cuff Size: Large)    Pulse 86    Temp 97.7 F (36.5 C) (Oral)    Wt 184 lb 9.6 oz (83.7 kg)    SpO2 99%    BMI 26.49 kg/m                 Constitutional: Pt appears in NAD               HENT: Head: NCAT.                Right Ear: External ear normal.                 Left Ear: External ear normal.                Eyes: . Pupils are equal, round, and reactive to light. Conjunctivae and EOM are normal               Nose: without d/c or deformity               Neck: Neck supple. Gross normal ROM               Cardiovascular: Normal rate and regular rhythm.                 Pulmonary/Chest: Effort normal and breath sounds without rales or wheezing.                Abd:  Soft, NT, ND, + BS, no organomegaly               Neurological: Pt is alert. At baseline orientation, motor grossly intact               Skin: Skin is warm. No rashes, no other new lesions, LE edema - none               Psychiatric: Pt behavior is normal without agitation, depressed affect   Micro: none  Cardiac tracings I have personally interpreted today:  none  Pertinent Radiological findings (summarize): none   Lab Results  Component Value Date   WBC 7.4 11/02/2021   HGB 16.4 11/02/2021   HCT 49.3 11/02/2021   PLT 213.0 11/02/2021   GLUCOSE 97 11/02/2021   CHOL 292 (H) 11/02/2021   TRIG 281.0 (H) 11/02/2021   HDL 43.80 11/02/2021   LDLDIRECT 230.0 11/02/2021   LDLCALC  125 09/08/2016   ALT 18 11/02/2021   AST 15 11/02/2021   NA 136 11/02/2021   K 4.5 11/02/2021   CL 102 11/02/2021   CREATININE 0.88 11/02/2021   BUN 10 11/02/2021   CO2 31 11/02/2021   TSH 1.56 11/02/2021   PSA 0.67 11/02/2021   INR 0.91 01/06/2017   HGBA1C 6.3 11/02/2021   MICROALBUR 1.3 05/09/2019   Assessment/Plan:  Tommy May  is a 60 y.o. White or Caucasian [1] male with  has a past medical history of Allergy, Carotid artery occlusion, Environmental and seasonal allergies, GERD (gastroesophageal reflux disease), Heart palpitations, History of Holter monitoring, Hyperlipidemia, Hypertension, Nerve pain, and Sleep apnea.  Encounter for well adult exam with abnormal findings Age and sex appropriate education and counseling updated with regular exercise and diet Referrals for preventative services - declines cologuard but ok for optho  Immunizations addressed - for prevnar 20, declines covid booster, flu shot, shingrix Smoking counseling  - none needed Evidence for depression or other mood disorder - with depression - for counseling referral Most recent labs reviewed. I have personally reviewed and have noted: 1) the patient's medical and social history 2) The patient's current medications and supplements 3) The patient's height, weight, and BMI have been recorded in the chart   Hyperlipidemia Lab Results  Component Value Date   [place] 125 09/08/2016   uncontrolled, pt to continue current crestor and zetia and f/u lab   Hypertension BP Readings from Last 3 Encounters:  11/02/21 120/72  04/05/21 (!) 162/82  10/14/20 130/80   Stable, pt to continue medical treatment losartan   Anxiety Stable, cont lexapro asd,  to f/u any worsening symptoms or concerns  Type 2 diabetes mellitus without complication, without long-term current use of insulin ([place]) Lab Results  Component Value Date   HGBA1C 6.3 11/02/2021   Stable, pt to continue current medical treatment  - diet   Marital problems Ok for counseling referral as requested  Acute adjustment disorder with mixed anxiety and depressed mood Ok for counseling referral as requested  Situational depression Ok for counseling referral as requested  Cervical disc disorder with radiculopathy of cervical region Stable, for pain med refill  Followup:  Return in about 6 months (around 05/03/2022).  Cathlean Cower, MD 11/07/2021 7:25 AM [facility]

## 2021-11-02 NOTE — Patient Instructions (Addendum)
You had the Prevnar 20 pneumonia shot today  You will be contacted regarding the referral for: eye doctor, and counseling  Please continue all other medications as before, and refills have been done if requested.  Please have the pharmacy call with any other refills you may need.  Please continue your efforts at being more active, low cholesterol diet, and weight control.  You are otherwise up to date with prevention measures today.  Please keep your appointments with your specialists as you may have planned  Please go to the LAB at the blood drawing area for the tests to be done  You will be contacted by phone if any changes need to be made immediately.  Otherwise, you will receive a letter about your results with an explanation, but please check with MyChart first.  Please remember to sign up for MyChart if you have not done so, as this will be important to you in the future with finding out test results, communicating by private email, and scheduling acute appointments online when needed.  Please make an Appointment to return in 6 months, or sooner if needed

## 2021-11-03 ENCOUNTER — Encounter: Payer: Self-pay | Admitting: Internal Medicine

## 2021-11-03 LAB — URINALYSIS, ROUTINE W REFLEX MICROSCOPIC
Bilirubin Urine: NEGATIVE
Hgb urine dipstick: NEGATIVE
Ketones, ur: NEGATIVE
Leukocytes,Ua: NEGATIVE
Nitrite: NEGATIVE
RBC / HPF: NONE SEEN (ref 0–?)
Specific Gravity, Urine: 1.015 (ref 1.000–1.030)
Total Protein, Urine: NEGATIVE
Urine Glucose: NEGATIVE
Urobilinogen, UA: 1 (ref 0.0–1.0)
pH: 7 (ref 5.0–8.0)

## 2021-11-03 MED ORDER — ROSUVASTATIN CALCIUM 40 MG PO TABS: 40.0000 mg | ORAL_TABLET | Freq: Every day | ORAL | 3 refills | Status: DC

## 2021-11-03 MED ORDER — EZETIMIBE 10 MG PO TABS
10.0000 mg | ORAL_TABLET | Freq: Every day | ORAL | 3 refills | Status: DC
Start: 2021-11-03 — End: 2022-12-01

## 2021-11-07 ENCOUNTER — Encounter: Payer: Self-pay | Admitting: Internal Medicine

## 2021-11-07 NOTE — Assessment & Plan Note (Signed)
Ok for counseling referral as requested

## 2021-11-07 NOTE — Assessment & Plan Note (Signed)
Stable, cont lexapro asd,  to f/u any worsening symptoms or concerns

## 2021-11-07 NOTE — Assessment & Plan Note (Signed)
Age and sex appropriate education and counseling updated with regular exercise and diet Referrals for preventative services - declines cologuard but ok for optho  Immunizations addressed - for prevnar 20, declines covid booster, flu shot, shingrix Smoking counseling  - none needed Evidence for depression or other mood disorder - with depression - for counseling referral Most recent labs reviewed. I have personally reviewed and have noted: 1) the patient's medical and social history 2) The patient's current medications and supplements 3) The patient's height, weight, and BMI have been recorded in the chart

## 2021-11-07 NOTE — Assessment & Plan Note (Signed)
Lab Results  Component Value Date   LDLCALC 125 09/08/2016   uncontrolled, pt to continue current crestor and zetia and f/u lab

## 2021-11-07 NOTE — Assessment & Plan Note (Signed)
Stable, for pain med refill

## 2021-11-07 NOTE — Assessment & Plan Note (Signed)
BP Readings from Last 3 Encounters:  11/02/21 120/72  04/05/21 (!) 162/82  10/14/20 130/80   Stable, pt to continue medical treatment losartan

## 2021-11-07 NOTE — Assessment & Plan Note (Signed)
Lab Results  Component Value Date   HGBA1C 6.3 11/02/2021   Stable, pt to continue current medical treatment  - diet

## 2021-12-01 ENCOUNTER — Other Ambulatory Visit: Payer: Self-pay | Admitting: Internal Medicine

## 2021-12-01 DIAGNOSIS — Z23 Encounter for immunization: Secondary | ICD-10-CM

## 2021-12-22 ENCOUNTER — Other Ambulatory Visit: Payer: Self-pay | Admitting: Internal Medicine

## 2022-02-02 ENCOUNTER — Other Ambulatory Visit: Payer: Self-pay | Admitting: Internal Medicine

## 2022-02-02 DIAGNOSIS — Z23 Encounter for immunization: Secondary | ICD-10-CM

## 2022-03-17 ENCOUNTER — Other Ambulatory Visit: Payer: Self-pay | Admitting: Internal Medicine

## 2022-05-02 ENCOUNTER — Other Ambulatory Visit: Payer: Self-pay | Admitting: Internal Medicine

## 2022-05-02 DIAGNOSIS — Z23 Encounter for immunization: Secondary | ICD-10-CM

## 2022-06-23 ENCOUNTER — Other Ambulatory Visit: Payer: Self-pay | Admitting: Internal Medicine

## 2022-06-23 DIAGNOSIS — Z23 Encounter for immunization: Secondary | ICD-10-CM

## 2022-07-29 ENCOUNTER — Other Ambulatory Visit: Payer: Self-pay | Admitting: Internal Medicine

## 2022-07-29 DIAGNOSIS — Z23 Encounter for immunization: Secondary | ICD-10-CM

## 2022-08-25 ENCOUNTER — Other Ambulatory Visit: Payer: Self-pay

## 2022-08-25 DIAGNOSIS — Z23 Encounter for immunization: Secondary | ICD-10-CM

## 2022-08-25 MED ORDER — GABAPENTIN 300 MG PO CAPS: ORAL_CAPSULE | ORAL | 0 refills | Status: DC

## 2022-09-05 ENCOUNTER — Other Ambulatory Visit: Payer: Self-pay | Admitting: Internal Medicine

## 2022-09-23 ENCOUNTER — Other Ambulatory Visit: Payer: Self-pay | Admitting: Internal Medicine

## 2022-09-23 DIAGNOSIS — Z23 Encounter for immunization: Secondary | ICD-10-CM

## 2022-10-21 ENCOUNTER — Other Ambulatory Visit: Payer: Self-pay | Admitting: Internal Medicine

## 2022-10-21 DIAGNOSIS — Z23 Encounter for immunization: Secondary | ICD-10-CM

## 2022-10-22 ENCOUNTER — Other Ambulatory Visit: Payer: Self-pay | Admitting: Internal Medicine

## 2022-11-22 ENCOUNTER — Other Ambulatory Visit: Payer: Self-pay | Admitting: Internal Medicine

## 2022-11-22 DIAGNOSIS — Z23 Encounter for immunization: Secondary | ICD-10-CM

## 2022-11-26 ENCOUNTER — Other Ambulatory Visit: Payer: Self-pay | Admitting: Internal Medicine

## 2022-11-26 DIAGNOSIS — Z23 Encounter for immunization: Secondary | ICD-10-CM

## 2022-12-01 ENCOUNTER — Ambulatory Visit: Payer: Commercial Managed Care - HMO | Admitting: Internal Medicine

## 2022-12-01 ENCOUNTER — Other Ambulatory Visit: Payer: Self-pay | Admitting: Internal Medicine

## 2022-12-01 ENCOUNTER — Encounter: Payer: Self-pay | Admitting: Internal Medicine

## 2022-12-01 VITALS — BP 130/82 | HR 77 | Temp 97.5°F | Ht 70.0 in | Wt 191.0 lb

## 2022-12-01 DIAGNOSIS — F4321 Adjustment disorder with depressed mood: Secondary | ICD-10-CM | POA: Diagnosis not present

## 2022-12-01 DIAGNOSIS — Z720 Tobacco use: Secondary | ICD-10-CM

## 2022-12-01 DIAGNOSIS — E538 Deficiency of other specified B group vitamins: Secondary | ICD-10-CM | POA: Diagnosis not present

## 2022-12-01 DIAGNOSIS — I10 Essential (primary) hypertension: Secondary | ICD-10-CM

## 2022-12-01 DIAGNOSIS — E559 Vitamin D deficiency, unspecified: Secondary | ICD-10-CM

## 2022-12-01 DIAGNOSIS — Z125 Encounter for screening for malignant neoplasm of prostate: Secondary | ICD-10-CM | POA: Diagnosis not present

## 2022-12-01 DIAGNOSIS — Z0001 Encounter for general adult medical examination with abnormal findings: Secondary | ICD-10-CM | POA: Diagnosis not present

## 2022-12-01 DIAGNOSIS — Z23 Encounter for immunization: Secondary | ICD-10-CM

## 2022-12-01 DIAGNOSIS — E119 Type 2 diabetes mellitus without complications: Secondary | ICD-10-CM

## 2022-12-01 DIAGNOSIS — E7800 Pure hypercholesterolemia, unspecified: Secondary | ICD-10-CM

## 2022-12-01 DIAGNOSIS — F17200 Nicotine dependence, unspecified, uncomplicated: Secondary | ICD-10-CM

## 2022-12-01 LAB — URINALYSIS, ROUTINE W REFLEX MICROSCOPIC
Bilirubin Urine: NEGATIVE
Hgb urine dipstick: NEGATIVE
Ketones, ur: NEGATIVE
Leukocytes,Ua: NEGATIVE
Nitrite: NEGATIVE
Specific Gravity, Urine: 1.02 (ref 1.000–1.030)
Total Protein, Urine: NEGATIVE
Urine Glucose: 250 — AB
Urobilinogen, UA: 1 (ref 0.0–1.0)
pH: 7 (ref 5.0–8.0)

## 2022-12-01 LAB — TSH: TSH: 1.44 u[IU]/mL (ref 0.35–5.50)

## 2022-12-01 LAB — HEMOGLOBIN A1C: Hgb A1c MFr Bld: 6.9 % — ABNORMAL HIGH (ref 4.6–6.5)

## 2022-12-01 LAB — CBC WITH DIFFERENTIAL/PLATELET
Basophils Absolute: 0 10*3/uL (ref 0.0–0.1)
Basophils Relative: 0.5 % (ref 0.0–3.0)
Eosinophils Absolute: 0.1 10*3/uL (ref 0.0–0.7)
Eosinophils Relative: 1.7 % (ref 0.0–5.0)
HCT: 49.9 % (ref 39.0–52.0)
Hemoglobin: 17 g/dL (ref 13.0–17.0)
Lymphocytes Relative: 33.5 % (ref 12.0–46.0)
Lymphs Abs: 1.8 10*3/uL (ref 0.7–4.0)
MCHC: 34 g/dL (ref 30.0–36.0)
MCV: 88.5 fl (ref 78.0–100.0)
Monocytes Absolute: 0.5 10*3/uL (ref 0.1–1.0)
Monocytes Relative: 9.8 % (ref 3.0–12.0)
Neutro Abs: 2.8 10*3/uL (ref 1.4–7.7)
Neutrophils Relative %: 54.5 % (ref 43.0–77.0)
Platelets: 183 10*3/uL (ref 150.0–400.0)
RBC: 5.63 Mil/uL (ref 4.22–5.81)
RDW: 12.7 % (ref 11.5–15.5)
WBC: 5.2 10*3/uL (ref 4.0–10.5)

## 2022-12-01 LAB — PSA: PSA: 0.51 ng/mL (ref 0.10–4.00)

## 2022-12-01 MED ORDER — ROSUVASTATIN CALCIUM 40 MG PO TABS: 40.0000 mg | ORAL_TABLET | Freq: Every day | ORAL | 3 refills | Status: DC

## 2022-12-01 MED ORDER — ESZOPICLONE 2 MG PO TABS: ORAL_TABLET | ORAL | 1 refills | Status: DC

## 2022-12-01 MED ORDER — ESCITALOPRAM OXALATE 20 MG PO TABS: 20.0000 mg | ORAL_TABLET | Freq: Every day | ORAL | 3 refills | Status: DC

## 2022-12-01 MED ORDER — LOSARTAN POTASSIUM 100 MG PO TABS: 100.0000 mg | ORAL_TABLET | Freq: Every day | ORAL | 3 refills | Status: DC

## 2022-12-01 MED ORDER — GABAPENTIN 300 MG PO CAPS: ORAL_CAPSULE | ORAL | 1 refills | Status: DC

## 2022-12-01 MED ORDER — ESOMEPRAZOLE MAGNESIUM 40 MG PO CPDR: 40.0000 mg | DELAYED_RELEASE_CAPSULE | Freq: Every day | ORAL | 3 refills | Status: AC

## 2022-12-01 MED ORDER — BUPROPION HCL ER (XL) 300 MG PO TB24: 300.0000 mg | ORAL_TABLET | Freq: Every day | ORAL | 3 refills | Status: DC

## 2022-12-01 MED ORDER — EZETIMIBE 10 MG PO TABS: 10.0000 mg | ORAL_TABLET | Freq: Every day | ORAL | 3 refills | Status: DC

## 2022-12-01 NOTE — Assessment & Plan Note (Signed)
With mild worsening, for increased wellbutrin xl 300 qd, cont lexapro 20 qd, declines need for counseling referral

## 2022-12-01 NOTE — Assessment & Plan Note (Signed)
Pt counsled to quit, pt not ready °

## 2022-12-01 NOTE — Assessment & Plan Note (Signed)
BP Readings from Last 3 Encounters:  12/01/22 130/82  11/02/21 120/72  04/05/21 (!) 162/82   Stable, pt to continue medical treatment losartan 100 mg qd

## 2022-12-01 NOTE — Assessment & Plan Note (Addendum)
Lab Results  Component Value Date   HGBA1C 6.3 11/02/2021   Stable, pt to continue current medical treatment  - diet, wt control; for eye exam referral

## 2022-12-01 NOTE — Progress Notes (Signed)
Patient ID: Tommy May, male   DOB: 10-10-1961, 62 y.o.   MRN: [id number]         Chief Complaint:: wellness exam and depression, smoker, DM, hld, htn       HPI:  Tommy May is a 62 y.o. male here for wellness exam; due for eye exam, declines covid booster, flu shot, cologuard but may have shingrix at pharmacy, o/w up to date                        Also Pt denies chest pain, increased sob or doe, wheezing, orthopnea, PND, increased LE swelling, palpitations, dizziness or syncope.   Pt denies polydipsia, polyuria, or new focal neuro s/s.    Pt denies fever, wt loss, night sweats, loss of appetite, or other constitutional symptoms  Has had mild worsening depressive symptoms, but no suicidal ideation, or panic; has ongoing anxiety, Still smoking but has cut back significantly, only 2-3 per day cigarrettes.     Wt Readings from Last 3 Encounters:  12/01/22 191 lb (86.6 kg)  11/02/21 184 lb 9.6 oz (83.7 kg)  04/05/21 180 lb 9.6 oz (81.9 kg)   BP Readings from Last 3 Encounters:  12/01/22 130/82  11/02/21 120/72  04/05/21 (!) 162/82   Immunization History  Administered Date(s) Administered   Influenza,inj,Quad PF,6+ Mos 08/01/2019   PFIZER(Purple Top)SARS-COV-2 Vaccination 01/25/2020, 02/15/2020   PNEUMOCOCCAL CONJUGATE-20 11/02/2021   Pneumococcal Polysaccharide-23 08/01/2019   Td 01/13/2016   Health Maintenance Due  Topic Date Due   Diabetic kidney evaluation - eGFR measurement  11/02/2022      Past Medical History:  Diagnosis Date   Allergy    Carotid artery occlusion    Environmental and seasonal allergies    GERD (gastroesophageal reflux disease)    Heart palpitations    History of Holter monitoring    Hyperlipidemia    Hypertension    does not take meds   Nerve pain    neck; takes gabapentin   Sleep apnea    wears CPAP nightly   Past Surgical History:  Procedure Laterality Date   dental implants     ENDARTERECTOMY Left 01/12/2017   Procedure: Left Carotid  ENDARTERECTOMY;  Surgeon: Conrad White Lake, MD;  Location: [place];  Service: Vascular;  Laterality: Left;   INGUINAL HERNIA REPAIR Left 08/25/2014   Procedure: LEFT INGUINAL HERNIA REPAIR REMOVAL SPERMATIC CORD MASS;  Surgeon: Jackolyn Confer, MD;  Location: [place];  Service: General;  Laterality: Left;   INSERTION OF MESH N/A 08/25/2014   Procedure: INSERTION OF MESH;  Surgeon: Jackolyn Confer, MD;  Location: [place];  Service: General;  Laterality: N/A;   PATCH ANGIOPLASTY Left 01/12/2017   Procedure: PATCH ANGIOPLASTY;  Surgeon: Conrad Lima, MD;  Location: [place];  Service: Vascular;  Laterality: Left;   ROTATOR CUFF REPAIR Left     reports that he has been smoking cigarettes. He has a 4.30 pack-year smoking history. He has never used smokeless tobacco. He reports that he does not drink alcohol and does not use drugs. family history includes Diabetes in his father and paternal grandmother; Heart attack in his maternal grandfather; Stroke (age of onset: 59) in his paternal grandfather; Stroke (age of onset: 31) in his father. Allergies  Allergen Reactions   Atorvastatin Other (See Comments)    MYALGIAS   Current Outpatient Medications on File Prior to Visit  Medication Sig Dispense Refill   acetaminophen (TYLENOL) 500 MG tablet  Take 1,000 mg by mouth every 8 (eight) hours as needed for moderate pain.      loratadine (CLARITIN) 10 MG tablet Take 10 mg by mouth daily.     Multiple Vitamin (MULTIVITAMIN WITH MINERALS) TABS tablet Take 1 tablet by mouth daily.     nicotine polacrilex (NICORETTE) 2 MG gum Take 2 mg by mouth every 4 (four) hours as needed for smoking cessation.     traMADol (ULTRAM) 50 MG tablet TAKE 1 TABLET BY MOUTH TWICE A DAY 60 tablet 0   No current facility-administered medications on file prior to visit.        ROS:  All others reviewed and negative.  Objective        PE:  BP 130/82 (BP Location: Right Arm, Patient Position: Sitting, Cuff Size: Large)   Pulse 77   Temp (!)  97.5 F (36.4 C) (Oral)   Ht '5\' 10"'$  (1.778 m)   Wt 191 lb (86.6 kg)   SpO2 97%   BMI 27.41 kg/m                 Constitutional: Pt appears in NAD               HENT: Head: NCAT.                Right Ear: External ear normal.                 Left Ear: External ear normal.                Eyes: . Pupils are equal, round, and reactive to light. Conjunctivae and EOM are normal               Nose: without d/c or deformity               Neck: Neck supple. Gross normal ROM               Cardiovascular: Normal rate and regular rhythm.                 Pulmonary/Chest: Effort normal and breath sounds without rales or wheezing.                Abd:  Soft, NT, ND, + BS, no organomegaly               Neurological: Pt is alert. At baseline orientation, motor grossly intact               Skin: Skin is warm. No rashes, no other new lesions, LE edema - none               Psychiatric: Pt behavior is normal without agitation , depressed affect  Micro: none  Cardiac tracings I have personally interpreted today:  none  Pertinent Radiological findings (summarize): none   Lab Results  Component Value Date   WBC 7.4 11/02/2021   HGB 16.4 11/02/2021   HCT 49.3 11/02/2021   PLT 213.0 11/02/2021   GLUCOSE 97 11/02/2021   CHOL 292 (H) 11/02/2021   TRIG 281.0 (H) 11/02/2021   HDL 43.80 11/02/2021   LDLDIRECT 230.0 11/02/2021   LDLCALC 125 09/08/2016   ALT 18 11/02/2021   AST 15 11/02/2021   NA 136 11/02/2021   K 4.5 11/02/2021   CL 102 11/02/2021   CREATININE 0.88 11/02/2021   BUN 10 11/02/2021   CO2 31 11/02/2021   TSH 1.56 11/02/2021   PSA 0.67 11/02/2021  INR 0.91 01/06/2017   HGBA1C 6.3 11/02/2021   MICROALBUR 1.3 05/09/2019   Assessment/Plan:  Tommy May is a 62 y.o. White or Caucasian [1] male with  has a past medical history of Allergy, Carotid artery occlusion, Environmental and seasonal allergies, GERD (gastroesophageal reflux disease), Heart palpitations, History of Holter  monitoring, Hyperlipidemia, Hypertension, Nerve pain, and Sleep apnea.  Encounter for well adult exam with abnormal findings Age and sex appropriate education and counseling updated with regular exercise and diet Referrals for preventative services - referred for eye exam, declines cologuard Immunizations addressed - declines covid booster, may consider shingrix at pharmacy Smoking counseling  - pt counsled to quit, pt not ready Evidence for depression or other mood disorder - mild worsening depression - for increased wellbutrin XL 300 qd, Most recent labs reviewed. I have personally reviewed and have noted: 1) the patient's medical and social history 2) The patient's current medications and supplements 3) The patient's height, weight, and BMI have been recorded in the chart   Hyperlipidemia Lab Results  Component Value Date   LDLCALC 125 09/08/2016   Uncontrolled, continue zeta 10 qd, crestor 40 qd and f/u lab today    Hypertension BP Readings from Last 3 Encounters:  12/01/22 130/82  11/02/21 120/72  04/05/21 (!) 162/82   Stable, pt to continue medical treatment losartan 100 mg qd   Type 2 diabetes mellitus without complication, without long-term current use of insulin ([facility]) Lab Results  Component Value Date   HGBA1C 6.3 11/02/2021   Stable, pt to continue current medical treatment  - diet, wt control  Situational depression With mild worsening, for increased wellbutrin xl 300 qd, cont lexapro 20 qd, declines need for counseling referral  Tobacco abuse Pt counsled to quit, pt not ready  Followup: Return in about 6 months (around 06/01/2023).  Cathlean Cower, MD 12/01/2022 9:15 PM [facility]

## 2022-12-01 NOTE — Assessment & Plan Note (Signed)
Age and sex appropriate education and counseling updated with regular exercise and diet Referrals for preventative services - referred for eye exam, declines cologuard Immunizations addressed - declines covid booster, may consider shingrix at pharmacy Smoking counseling  - pt counsled to quit, pt not ready Evidence for depression or other mood disorder - mild worsening depression - for increased wellbutrin XL 300 qd, Most recent labs reviewed. I have personally reviewed and have noted: 1) the patient's medical and social history 2) The patient's current medications and supplements 3) The patient's height, weight, and BMI have been recorded in the chart

## 2022-12-01 NOTE — Assessment & Plan Note (Signed)
Lab Results  Component Value Date   LDLCALC 125 09/08/2016   Uncontrolled, continue zeta 10 qd, crestor 40 qd and f/u lab today

## 2022-12-01 NOTE — Patient Instructions (Addendum)
Please have your Shingrix (shingles) shots done at your local pharmacy.  Ok to increase the Wellbutrin XL 300 mg per day  Please continue all other medications as before, and refills have been done if requested.  Please have the pharmacy call with any other refills you may need.  Please continue your efforts at being more active, low cholesterol diet, and weight control.  You are otherwise up to date with prevention measures today.  Please keep your appointments with your specialists as you may have planned  You will be contacted regarding the referral for: Eye doctor  Please go to the LAB at the blood drawing area for the tests to be done  You will be contacted by phone if any changes need to be made immediately.  Otherwise, you will receive a letter about your results with an explanation, but please check with MyChart first.  Please remember to sign up for MyChart if you have not done so, as this will be important to you in the future with finding out test results, communicating by private email, and scheduling acute appointments online when needed.  Please make an Appointment to return in 6 months, or sooner if needed

## 2022-12-02 LAB — BASIC METABOLIC PANEL
BUN: 10 mg/dL (ref 6–23)
CO2: 27 mEq/L (ref 19–32)
Calcium: 8.6 mg/dL (ref 8.4–10.5)
Chloride: 102 mEq/L (ref 96–112)
Creatinine, Ser: 0.85 mg/dL (ref 0.40–1.50)
GFR: 92.33 mL/min (ref >60.00)
Glucose, Bld: 151 mg/dL — ABNORMAL HIGH (ref 70–99)
Potassium: 4.5 mEq/L (ref 3.5–5.1)
Sodium: 138 mEq/L (ref 135–145)

## 2022-12-02 LAB — VITAMIN D 25 HYDROXY (VIT D DEFICIENCY, FRACTURES): VITD: 38.36 ng/mL (ref 30.00–100.00)

## 2022-12-02 LAB — HEPATIC FUNCTION PANEL
ALT: 23 U/L (ref 0–53)
AST: 18 U/L (ref 0–37)
Albumin: 3.4 g/dL — ABNORMAL LOW (ref 3.5–5.2)
Alkaline Phosphatase: 62 U/L (ref 39–117)
Bilirubin, Direct: 0.1 mg/dL (ref 0.0–0.3)
Total Bilirubin: 0.5 mg/dL (ref 0.2–1.2)
Total Protein: 5.9 g/dL — ABNORMAL LOW (ref 6.0–8.3)

## 2022-12-02 LAB — LIPID PANEL
Cholesterol: 192 mg/dL (ref 0–200)
HDL: 54 mg/dL (ref >39.00)
LDL Cholesterol: 108 mg/dL — ABNORMAL HIGH (ref 0–99)
NonHDL: 137.98
Total CHOL/HDL Ratio: 4
Triglycerides: 152 mg/dL — ABNORMAL HIGH (ref 0.0–149.0)
VLDL: 30.4 mg/dL (ref 0.0–40.0)

## 2022-12-02 LAB — MICROALBUMIN / CREATININE URINE RATIO
Creatinine,U: 104.9 mg/dL
Microalb Creat Ratio: 0.8 mg/g (ref 0.0–30.0)
Microalb, Ur: 0.8 mg/dL (ref 0.0–1.9)

## 2022-12-02 LAB — VITAMIN B12: Vitamin B-12: 406 pg/mL (ref 211–911)

## 2022-12-09 ENCOUNTER — Encounter: Payer: Self-pay | Admitting: Internal Medicine

## 2022-12-12 MED ORDER — LOSARTAN POTASSIUM 100 MG PO TABS: 100.0000 mg | ORAL_TABLET | Freq: Every day | ORAL | 3 refills | Status: DC

## 2022-12-12 MED ORDER — BUPROPION HCL ER (XL) 300 MG PO TB24: 300.0000 mg | ORAL_TABLET | Freq: Every day | ORAL | 3 refills | Status: DC

## 2022-12-14 ENCOUNTER — Telehealth: Payer: Self-pay

## 2022-12-14 DIAGNOSIS — Z23 Encounter for immunization: Secondary | ICD-10-CM

## 2022-12-14 MED ORDER — ESZOPICLONE 2 MG PO TABS: ORAL_TABLET | ORAL | 1 refills | Status: DC

## 2022-12-14 NOTE — Telephone Encounter (Signed)
Ok done erx to [employer]

## 2022-12-14 NOTE — Telephone Encounter (Signed)
Please clarify the directions for Eszopiclone tab '2MG'$ , how many tablets are taken per dose before bedtime? Please send new order with clarification

## 2022-12-21 ENCOUNTER — Other Ambulatory Visit: Payer: Self-pay | Admitting: Internal Medicine

## 2022-12-21 DIAGNOSIS — Z23 Encounter for immunization: Secondary | ICD-10-CM

## 2022-12-26 ENCOUNTER — Other Ambulatory Visit: Payer: Self-pay | Admitting: Internal Medicine

## 2022-12-26 DIAGNOSIS — Z23 Encounter for immunization: Secondary | ICD-10-CM

## 2023-02-20 ENCOUNTER — Other Ambulatory Visit: Payer: Self-pay | Admitting: Internal Medicine

## 2023-02-20 DIAGNOSIS — Z23 Encounter for immunization: Secondary | ICD-10-CM

## 2023-02-28 ENCOUNTER — Other Ambulatory Visit: Payer: Self-pay | Admitting: Internal Medicine

## 2023-02-28 NOTE — Telephone Encounter (Signed)
Done erx 

## 2023-03-22 ENCOUNTER — Other Ambulatory Visit: Payer: Self-pay | Admitting: Internal Medicine

## 2023-03-22 DIAGNOSIS — M5010 Cervical disc disorder with radiculopathy, unspecified cervical region: Secondary | ICD-10-CM

## 2023-03-22 DIAGNOSIS — Z23 Encounter for immunization: Secondary | ICD-10-CM

## 2023-03-24 ENCOUNTER — Encounter: Payer: Self-pay | Admitting: Internal Medicine

## 2023-03-24 NOTE — Telephone Encounter (Signed)
Called pt back inform Dr. Jonny Ruiz sent in refilled. He states he will come omn  Tues to have the UDS done.Marland KitchenRaechel Chute

## 2023-03-24 NOTE — Telephone Encounter (Signed)
MD is out of the office until Monday 5/6. Pt has call requesting it to be sent. Pls advise.Marland KitchenRaechel Chute

## 2023-03-24 NOTE — Telephone Encounter (Signed)
Ordered UDS, needs drawn prior to refill as no past. Send back to me once pending. I also do not see pain management agreement with PCP strongly recommend this filled out at next visit to avoid delays in refills.

## 2023-03-24 NOTE — Telephone Encounter (Signed)
Called pt he states he is not able to come and do UDS today. He would rather wait on Dr. Jonny Ruiz. He states he 's been taking this medicine 5 years now../l,mb

## 2023-03-24 NOTE — Telephone Encounter (Signed)
Done erx 

## 2023-03-28 ENCOUNTER — Other Ambulatory Visit: Payer: Commercial Managed Care - HMO

## 2023-05-29 ENCOUNTER — Other Ambulatory Visit: Payer: Self-pay | Admitting: Internal Medicine

## 2023-06-01 ENCOUNTER — Encounter: Payer: Self-pay | Admitting: Internal Medicine

## 2023-06-01 ENCOUNTER — Ambulatory Visit: Payer: Commercial Managed Care - HMO | Admitting: Internal Medicine

## 2023-06-01 VITALS — BP 126/84 | HR 95 | Temp 98.1°F | Ht 70.0 in | Wt 198.0 lb

## 2023-06-01 DIAGNOSIS — E7800 Pure hypercholesterolemia, unspecified: Secondary | ICD-10-CM

## 2023-06-01 DIAGNOSIS — E538 Deficiency of other specified B group vitamins: Secondary | ICD-10-CM

## 2023-06-01 DIAGNOSIS — E119 Type 2 diabetes mellitus without complications: Secondary | ICD-10-CM

## 2023-06-01 DIAGNOSIS — Z125 Encounter for screening for malignant neoplasm of prostate: Secondary | ICD-10-CM

## 2023-06-01 DIAGNOSIS — F419 Anxiety disorder, unspecified: Secondary | ICD-10-CM | POA: Diagnosis not present

## 2023-06-01 DIAGNOSIS — F4323 Adjustment disorder with mixed anxiety and depressed mood: Secondary | ICD-10-CM | POA: Diagnosis not present

## 2023-06-01 DIAGNOSIS — Z7985 Long-term (current) use of injectable non-insulin antidiabetic drugs: Secondary | ICD-10-CM

## 2023-06-01 DIAGNOSIS — I10 Essential (primary) hypertension: Secondary | ICD-10-CM

## 2023-06-01 DIAGNOSIS — E559 Vitamin D deficiency, unspecified: Secondary | ICD-10-CM

## 2023-06-01 DIAGNOSIS — Z720 Tobacco use: Secondary | ICD-10-CM | POA: Diagnosis not present

## 2023-06-01 LAB — HEPATIC FUNCTION PANEL
ALT: 22 U/L (ref 0–53)
AST: 16 U/L (ref 0–37)
Albumin: 3.4 g/dL — ABNORMAL LOW (ref 3.5–5.2)
Alkaline Phosphatase: 67 U/L (ref 39–117)
Bilirubin, Direct: 0.1 mg/dL (ref 0.0–0.3)
Total Bilirubin: 0.8 mg/dL (ref 0.2–1.2)
Total Protein: 6.1 g/dL (ref 6.0–8.3)

## 2023-06-01 LAB — LDL CHOLESTEROL, DIRECT: Direct LDL: 178 mg/dL

## 2023-06-01 LAB — LIPID PANEL
Cholesterol: 264 mg/dL — ABNORMAL HIGH (ref 0–200)
HDL: 41.3 mg/dL (ref >39.00)
NonHDL: 222.79
Total CHOL/HDL Ratio: 6
Triglycerides: 217 mg/dL — ABNORMAL HIGH (ref 0.0–149.0)
VLDL: 43.4 mg/dL — ABNORMAL HIGH (ref 0.0–40.0)

## 2023-06-01 LAB — HEMOGLOBIN A1C: Hgb A1c MFr Bld: 6.9 % — ABNORMAL HIGH (ref 4.6–6.5)

## 2023-06-01 LAB — BASIC METABOLIC PANEL
BUN: 11 mg/dL (ref 6–23)
CO2: 29 mEq/L (ref 19–32)
Calcium: 9.1 mg/dL (ref 8.4–10.5)
Chloride: 101 mEq/L (ref 96–112)
Creatinine, Ser: 0.96 mg/dL (ref 0.40–1.50)
GFR: 83.7 mL/min (ref >60.00)
Glucose, Bld: 195 mg/dL — ABNORMAL HIGH (ref 70–99)
Potassium: 4.5 mEq/L (ref 3.5–5.1)
Sodium: 136 mEq/L (ref 135–145)

## 2023-06-01 MED ORDER — TIRZEPATIDE 2.5 MG/0.5ML ~~LOC~~ SOAJ: 2.5000 mg | SUBCUTANEOUS | 11 refills | Status: DC

## 2023-06-01 MED ORDER — REPATHA SURECLICK 140 MG/ML ~~LOC~~ SOAJ: 140.0000 mg | SUBCUTANEOUS | 3 refills | Status: DC

## 2023-06-01 NOTE — Progress Notes (Signed)
Patient ID: Tommy May, male   DOB: Sep 20, 1961, 62 y.o.   MRN: [id number]        Chief Complaint: follow up HTN, HLD and hyperglycemia , venous insufficiency       HPI:  Tommy May is a 62 y.o. male here overall doing ok.  Pt denies chest pain, increased sob or doe, wheezing, orthopnea, PND, palpitations, dizziness or syncope, but does have mild intermittent LE swelling better in the AM, worse in the pm again.   Pt denies polydipsia, polyuria, or new focal neuro s/s.    Pt denies fever, wt loss, night sweats, loss of appetite, or other constitutional symptoms  Interested in [facility] for wt loss and sugar.  Pt does not want statin, but willing to take repatha.  Still smoking, not ready to quit.  Denies worsening depressive symptoms, suicidal ideation, or panic; has ongoing anxiety, and is ok with referral to counseling.         Wt Readings from Last 3 Encounters:  06/01/23 198 lb (89.8 kg)  12/01/22 191 lb (86.6 kg)  11/02/21 184 lb 9.6 oz (83.7 kg)   BP Readings from Last 3 Encounters:  06/01/23 126/84  12/01/22 130/82  11/02/21 120/72         Past Medical History:  Diagnosis Date   Allergy    Carotid artery occlusion    Environmental and seasonal allergies    GERD (gastroesophageal reflux disease)    Heart palpitations    History of Holter monitoring    Hyperlipidemia    Hypertension    does not take meds   Nerve pain    neck; takes gabapentin   Sleep apnea    wears CPAP nightly   Past Surgical History:  Procedure Laterality Date   dental implants     ENDARTERECTOMY Left 01/12/2017   Procedure: Left Carotid ENDARTERECTOMY;  Surgeon: Fransisco Hertz, MD;  Location: [facility] OR;  Service: Vascular;  Laterality: Left;   INGUINAL HERNIA REPAIR Left 08/25/2014   Procedure: LEFT INGUINAL HERNIA REPAIR REMOVAL SPERMATIC CORD MASS;  Surgeon: Avel Peace, MD;  Location[facility];  Service: General;  Laterality: Left;   INSERTION OF MESH N/A 08/25/2014   Procedure: INSERTION OF MESH;   Surgeon: Avel Peace, MD;  Location: [facility] OR;  Service: General;  Laterality: N/A;   PATCH ANGIOPLASTY Left 01/12/2017   Procedure: PATCH ANGIOPLASTY;  Surgeon: Fransisco Hertz, MD;  Location: [facility] OR;  Service: Vascular;  Laterality: Left;   ROTATOR CUFF REPAIR Left     reports that he has been smoking cigarettes. He has a 4.3 pack-year smoking history. He has never used smokeless tobacco. He reports that he does not drink alcohol and does not use drugs. family history includes Diabetes in his father and paternal grandmother; Heart attack in his maternal grandfather; Stroke (age of onset: 74) in his paternal grandfather; Stroke (age of onset: 34) in his father. No Active Allergies Current Outpatient Medications on File Prior to Visit  Medication Sig Dispense Refill   acetaminophen (TYLENOL) 500 MG tablet Take 1,000 mg by mouth every 8 (eight) hours as needed for moderate pain.      buPROPion (WELLBUTRIN XL) 300 MG 24 hr tablet Take 1 tablet (300 mg total) by mouth daily. 90 tablet 3   escitalopram (LEXAPRO) 20 MG tablet TAKE 1 TABLET BY MOUTH EVERY DAY 90 tablet 3   esomeprazole (NEXIUM) 40 MG capsule Take 1 capsule (40 mg total) by mouth daily with breakfast. 90  capsule 3   eszopiclone (LUNESTA) 2 MG TABS tablet Take 1 tab by mouth immediately before bedtime once daily as needed for sleep 90 tablet 1   gabapentin (NEURONTIN) 300 MG capsule TAKE 1 CAPSULE BY MOUTH EVERYDAY AT BEDTIME 30 capsule 1   loratadine (CLARITIN) 10 MG tablet Take 10 mg by mouth daily.     LORazepam (ATIVAN) 1 MG tablet TAKE 1 TABLET BY MOUTH EVERY DAY AS NEEDED FOR ANXIETY 30 tablet 2   losartan (COZAAR) 100 MG tablet Take 1 tablet (100 mg total) by mouth daily. 90 tablet 3   Multiple Vitamin (MULTIVITAMIN WITH MINERALS) TABS tablet Take 1 tablet by mouth daily.     traMADol (ULTRAM) 50 MG tablet TAKE 1 TABLET BY MOUTH TWICE A DAY 60 tablet 2   No current facility-administered medications on file prior to visit.         ROS:  All others reviewed and negative.  Objective        PE:  BP 126/84 (BP Location: Left Arm, Patient Position: Sitting, Cuff Size: Normal)   Pulse 95   Temp 98.1 F (36.7 C) (Oral)   Ht 5\' 10"  (1.778 m)   Wt 198 lb (89.8 kg)   SpO2 99%   BMI 28.41 kg/m                 Constitutional: Pt appears in NAD               HENT: Head: NCAT.                Right Ear: External ear normal.                 Left Ear: External ear normal.                Eyes: . Pupils are equal, round, and reactive to light. Conjunctivae and EOM are normal               Nose: without d/c or deformity               Neck: Neck supple. Gross normal ROM               Cardiovascular: Normal rate and regular rhythm.                 Pulmonary/Chest: Effort normal and breath sounds without rales or wheezing.                Abd:  Soft, NT, ND, + BS, no organomegaly               Neurological: Pt is alert. At baseline orientation, motor grossly intact               Skin: Skin is warm. No rashes, no other new lesions, LE edema - trace to 1+ bilateral.               Psychiatric: Pt behavior is normal without agitation   Micro: none  Cardiac tracings I have personally interpreted today:  none  Pertinent Radiological findings (summarize): none   Lab Results  Component Value Date   WBC 5.2 12/01/2022   HGB 17.0 12/01/2022   HCT 49.9 12/01/2022   PLT 183.0 12/01/2022   GLUCOSE 195 (H) 06/01/2023   CHOL 264 (H) 06/01/2023   TRIG 217.0 (H) 06/01/2023   HDL 41.30 06/01/2023   LDLDIRECT 178.0 06/01/2023   LDLCALC 108 (H) 12/01/2022   ALT 22 06/01/2023  AST 16 06/01/2023   NA 136 06/01/2023   K 4.5 06/01/2023   CL 101 06/01/2023   CREATININE 0.96 06/01/2023   BUN 11 06/01/2023   CO2 29 06/01/2023   TSH 1.44 12/01/2022   PSA 0.51 12/01/2022   INR 0.91 01/06/2017   HGBA1C 6.9 (H) 06/01/2023   MICROALBUR 0.8 12/01/2022   Assessment/Plan:  Tommy May is a 62 y.o. White or Caucasian [1] male with  has  a past medical history of Allergy, Carotid artery occlusion, Environmental and seasonal allergies, GERD (gastroesophageal reflux disease), Heart palpitations, History of Holter monitoring, Hyperlipidemia, Hypertension, Nerve pain, and Sleep apnea.  Acute adjustment disorder with mixed anxiety and depressed mood Mild persistent somewhat worsening, for referral counseling  Anxiety Pt to continue wellbutrin xl 300 every day, lexparo 20 qd  Hyperlipidemia Lab Results  Component Value Date   LDLCALC 108 (H) 12/01/2022   Uncontrolled, cont diet, also for start repatha 140 q 2 wks   Hypertension BP Readings from Last 3 Encounters:  06/01/23 126/84  12/01/22 130/82  11/02/21 120/72   Stable, pt to continue medical treatment losartan 100 qd   Tobacco abuse Pt counsled to quit, pt not ready  Type 2 diabetes mellitus without complication, without long-term current use of insulin ([facility]) Lab Results  Component Value Date   HGBA1C 6.9 (H) 06/01/2023   Uncontrolled,, pt to start mounjaro 2.5 mg weekly  Followup: Return in about 6 months (around 12/02/2023).  Oliver Barre, MD 06/03/2023 7:24 PM [facility]

## 2023-06-01 NOTE — Patient Instructions (Addendum)
Ok to stay off the crestor and zetia as you have done  Please take all new medication as prescribed - the Mounjaro 2.5 mg weekly, but call in 1 month for the higher dose 5 mg if you are taking this well  Please take all new medication as prescribed - the Repatha 140 mg every 2 weeks  Please continue all other medications as before, and refills have been done if requested.  Please have the pharmacy call with any other refills you may need.  Please keep your appointments with your specialists as you may have planned  You will be contacted regarding the referral for: Counseling  Please go to the LAB at the blood drawing area for the tests to be done  You will be contacted by phone if any changes need to be made immediately.  Otherwise, you will receive a letter about your results with an explanation, but please check with MyChart first.  Please make an Appointment to return in 6 months, or sooner if needed, also with Lab Appointment for testing done 3-5 days before at the FIRST FLOOR Lab (so this is for TWO appointments - please see the scheduling desk as you leave)

## 2023-06-03 ENCOUNTER — Encounter: Payer: Self-pay | Admitting: Internal Medicine

## 2023-06-03 NOTE — Assessment & Plan Note (Signed)
BP Readings from Last 3 Encounters:  06/01/23 126/84  12/01/22 130/82  11/02/21 120/72   Stable, pt to continue medical treatment losartan 100 qd

## 2023-06-03 NOTE — Assessment & Plan Note (Signed)
Lab Results  Component Value Date   HGBA1C 6.9 (H) 06/01/2023   Uncontrolled,, pt to start mounjaro 2.5 mg weekly

## 2023-06-03 NOTE — Assessment & Plan Note (Signed)
Pt counsled to quit, pt not ready °

## 2023-06-03 NOTE — Assessment & Plan Note (Signed)
Mild persistent somewhat worsening, for referral counseling

## 2023-06-03 NOTE — Assessment & Plan Note (Signed)
Pt to continue wellbutrin xl 300 every day, lexparo 20 qd

## 2023-06-03 NOTE — Assessment & Plan Note (Signed)
Lab Results  Component Value Date   LDLCALC 108 (H) 12/01/2022   Uncontrolled, cont diet, also for start repatha 140 q 2 wks

## 2023-06-12 ENCOUNTER — Telehealth: Payer: Self-pay

## 2023-06-12 NOTE — Telephone Encounter (Signed)
Pharmacy Patient Advocate Encounter  Received notification from [employer] that Prior Authorization for Repatha [employer] has been DENIED because see below..   PA #/Case ID/Reference #: [id number]   Please be advised we currently do not have a Pharmacist to review denials, therefore you will need to process appeals accordingly as needed. Thanks for your support at this time. Contact for appeals are as follows: Phone: [phone], Fax: [phone]  DENIAL ATTACHED TO MEDIA

## 2023-06-21 ENCOUNTER — Other Ambulatory Visit: Payer: Self-pay | Admitting: Internal Medicine

## 2023-06-21 DIAGNOSIS — Z23 Encounter for immunization: Secondary | ICD-10-CM

## 2023-08-25 ENCOUNTER — Other Ambulatory Visit: Payer: Self-pay | Admitting: Internal Medicine

## 2023-09-05 ENCOUNTER — Encounter: Payer: Self-pay | Admitting: Internal Medicine

## 2023-09-20 ENCOUNTER — Other Ambulatory Visit: Payer: Self-pay | Admitting: Internal Medicine

## 2023-09-20 DIAGNOSIS — Z23 Encounter for immunization: Secondary | ICD-10-CM

## 2023-09-28 ENCOUNTER — Other Ambulatory Visit: Payer: Self-pay

## 2023-09-28 ENCOUNTER — Emergency Department (HOSPITAL_COMMUNITY)
Admission: EM | Admit: 2023-09-28 | Discharge: 2023-09-28 | Disposition: A | Discharge status: Home / Self Care | Payer: Commercial Managed Care - HMO

## 2023-09-28 ENCOUNTER — Emergency Department (HOSPITAL_COMMUNITY): Payer: Commercial Managed Care - HMO

## 2023-09-28 ENCOUNTER — Encounter (HOSPITAL_COMMUNITY): Payer: Self-pay

## 2023-09-28 DIAGNOSIS — R0789 Other chest pain: Secondary | ICD-10-CM | POA: Insufficient documentation

## 2023-09-28 DIAGNOSIS — S0083XA Contusion of other part of head, initial encounter: Secondary | ICD-10-CM | POA: Insufficient documentation

## 2023-09-28 DIAGNOSIS — M25511 Pain in right shoulder: Secondary | ICD-10-CM | POA: Diagnosis not present

## 2023-09-28 DIAGNOSIS — S51811A Laceration without foreign body of right forearm, initial encounter: Secondary | ICD-10-CM | POA: Diagnosis present

## 2023-09-28 DIAGNOSIS — S80811A Abrasion, right lower leg, initial encounter: Secondary | ICD-10-CM | POA: Diagnosis not present

## 2023-09-28 DIAGNOSIS — W11XXXA Fall on and from ladder, initial encounter: Secondary | ICD-10-CM | POA: Insufficient documentation

## 2023-09-28 DIAGNOSIS — Z23 Encounter for immunization: Secondary | ICD-10-CM | POA: Diagnosis not present

## 2023-09-28 DIAGNOSIS — R109 Unspecified abdominal pain: Secondary | ICD-10-CM | POA: Insufficient documentation

## 2023-09-28 DIAGNOSIS — W19XXXA Unspecified fall, initial encounter: Secondary | ICD-10-CM

## 2023-09-28 DIAGNOSIS — Y900 Blood alcohol level of less than 20 mg/100 ml: Secondary | ICD-10-CM | POA: Insufficient documentation

## 2023-09-28 LAB — I-STAT CHEM 8, ED
BUN: 12 mg/dL (ref 8–23)
Calcium, Ion: 1.13 mmol/L — ABNORMAL LOW (ref 1.15–1.40)
Chloride: 105 mmol/L (ref 98–111)
Creatinine, Ser: 0.9 mg/dL (ref 0.61–1.24)
Glucose, Bld: 193 mg/dL — ABNORMAL HIGH (ref 70–99)
HCT: 45 % (ref 39.0–52.0)
Hemoglobin: 15.3 g/dL (ref 13.0–17.0)
Potassium: 4.1 mmol/L (ref 3.5–5.1)
Sodium: 139 mmol/L (ref 135–145)
TCO2: 22 mmol/L (ref 22–32)

## 2023-09-28 LAB — URINALYSIS, ROUTINE W REFLEX MICROSCOPIC
Bacteria, UA: NONE SEEN
Bilirubin Urine: NEGATIVE
Glucose, UA: 50 mg/dL — AB
Ketones, ur: NEGATIVE mg/dL
Leukocytes,Ua: NEGATIVE
Nitrite: NEGATIVE
Protein, ur: NEGATIVE mg/dL
Specific Gravity, Urine: 1.02 (ref 1.005–1.030)
pH: 8 (ref 5.0–8.0)

## 2023-09-28 LAB — CBC
HCT: 46.5 % (ref 39.0–52.0)
Hemoglobin: 16 g/dL (ref 13.0–17.0)
MCH: 30.6 pg (ref 26.0–34.0)
MCHC: 34.4 g/dL (ref 30.0–36.0)
MCV: 88.9 fL (ref 80.0–100.0)
Platelets: 174 10*3/uL (ref 150–400)
RBC: 5.23 MIL/uL (ref 4.22–5.81)
RDW: 12 % (ref 11.5–15.5)
WBC: 6.1 10*3/uL (ref 4.0–10.5)
nRBC: 0 % (ref 0.0–0.2)

## 2023-09-28 LAB — PROTIME-INR
INR: 0.9 (ref 0.8–1.2)
Prothrombin Time: 12.7 s (ref 11.4–15.2)

## 2023-09-28 LAB — COMPREHENSIVE METABOLIC PANEL
ALT: 23 U/L (ref 0–44)
AST: 20 U/L (ref 15–41)
Albumin: 3 g/dL — ABNORMAL LOW (ref 3.5–5.0)
Alkaline Phosphatase: 65 U/L (ref 38–126)
Anion gap: 13 (ref 5–15)
BUN: 13 mg/dL (ref 8–23)
CO2: 21 mmol/L — ABNORMAL LOW (ref 22–32)
Calcium: 9 mg/dL (ref 8.9–10.3)
Chloride: 103 mmol/L (ref 98–111)
Creatinine, Ser: 0.8 mg/dL (ref 0.61–1.24)
GFR, Estimated: >60 mL/min (ref >60)
Glucose, Bld: 191 mg/dL — ABNORMAL HIGH (ref 70–99)
Potassium: 3.9 mmol/L (ref 3.5–5.1)
Sodium: 137 mmol/L (ref 135–145)
Total Bilirubin: 0.7 mg/dL (ref <1.2)
Total Protein: 6.3 g/dL — ABNORMAL LOW (ref 6.5–8.1)

## 2023-09-28 LAB — SAMPLE TO BLOOD BANK

## 2023-09-28 LAB — I-STAT CG4 LACTIC ACID, ED: Lactic Acid, Venous: 3.1 mmol/L (ref 0.5–1.9)

## 2023-09-28 LAB — ETHANOL: Alcohol, Ethyl (B): <10 mg/dL (ref <10)

## 2023-09-28 MED ORDER — IOHEXOL 300 MG/ML  SOLN
100.0000 mL | Freq: Once | INTRAMUSCULAR | Status: AC | PRN
  Administered 2023-09-28: 100 mL via INTRAVENOUS

## 2023-09-28 MED ORDER — TETANUS-DIPHTH-ACELL PERTUSSIS 5-2.5-18.5 LF-MCG/0.5 IM SUSY
0.5000 mL | PREFILLED_SYRINGE | Freq: Once | INTRAMUSCULAR | Status: AC
  Administered 2023-09-28: 0.5 mL via INTRAMUSCULAR
  Filled 2023-09-28: qty 0.5

## 2023-09-28 MED ORDER — LIDOCAINE 4 % EX PTCH: 1.0000 | MEDICATED_PATCH | CUTANEOUS | 0 refills | Status: AC

## 2023-09-28 MED ORDER — FENTANYL CITRATE PF 50 MCG/ML IJ SOSY
50.0000 ug | PREFILLED_SYRINGE | Freq: Once | INTRAMUSCULAR | Status: AC
  Administered 2023-09-28: 50 ug via INTRAVENOUS
  Filled 2023-09-28: qty 1

## 2023-09-28 MED ORDER — OXYCODONE-ACETAMINOPHEN 5-325 MG PO TABS
1.0000 | ORAL_TABLET | Freq: Once | ORAL | Status: AC
  Administered 2023-09-28: 1 via ORAL
  Filled 2023-09-28: qty 1

## 2023-09-28 MED ORDER — LIDOCAINE-EPINEPHRINE (PF) 2 %-1:200000 IJ SOLN
10.0000 mL | Freq: Once | INTRAMUSCULAR | Status: AC
  Administered 2023-09-28: 10 mL
  Filled 2023-09-28: qty 20

## 2023-09-28 MED ORDER — SODIUM CHLORIDE 0.9 % IV BOLUS
1000.0000 mL | Freq: Once | INTRAVENOUS | Status: AC
  Administered 2023-09-28: 1000 mL via INTRAVENOUS

## 2023-09-28 NOTE — Discharge Instructions (Signed)
Please follow-up with Ortho.  Material the counter Tylenol alternate Motrin for pain.  We are prescribing a lidocaine patch that she also may use for symptomatic relief.  Please follow-up with Ortho since possible.  Return immediately if develop any fevers, chills, sudden onset headache, vision changes, facial droop, seizures, chest pain, shortness of breath, abdominal pain or any new or worsening symptoms that are concerning to you.

## 2023-09-28 NOTE — ED Notes (Signed)
Lactic Acid 3.14

## 2023-09-28 NOTE — ED Notes (Signed)
Patient requesting to leave AMA. C collar removed by patient. MD at bedside and patient agreeable to stay for laceration repair.

## 2023-09-28 NOTE — ED Notes (Signed)
Patient to CT.
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
## 2023-09-28 NOTE — ED Provider Notes (Addendum)
[place] EMERGENCY DEPARTMENT AT [facility] Provider Note   CSN: [id number] Arrival date & time: 09/28/23  4098     History  Chief Complaint  Patient presents with   Tommy May is a 62 y.o. male.  62 year old male presenting emergency department after fall from ladder.  Was cleaning gutters when the ladder kicked out from underneath him.  Fell from approximately 10 feet.  Struck his head.  No reported LOC.  Not on blood thinners.  Complaining of pain to right shoulder, right shin and into head.   Fall       Home Medications Prior to Admission medications   Medication Sig Start Date End Date Taking? Authorizing Provider  acetaminophen (TYLENOL) 500 MG tablet Take 1,000 mg by mouth every 8 (eight) hours as needed for moderate pain.     [provider]  buPROPion (WELLBUTRIN XL) 300 MG 24 hr tablet Take 1 tablet (300 mg total) by mouth daily. 12/12/22   Corwin Levins, MD  escitalopram (LEXAPRO) 20 MG tablet TAKE 1 TABLET BY MOUTH EVERY DAY 12/26/22   Corwin Levins, MD  esomeprazole (NEXIUM) 40 MG capsule Take 1 capsule (40 mg total) by mouth daily with breakfast. 12/01/22   Corwin Levins, MD  eszopiclone Alfonso Patten) 2 MG TABS tablet Take 1 tab by mouth immediately before bedtime once daily as needed for sleep 12/14/22   Corwin Levins, MD  Evolocumab (REPATHA SURECLICK) 140 MG/ML SOAJ Inject 140 mg into the skin every 14 (fourteen) days. 06/01/23   Corwin Levins, MD  gabapentin (NEURONTIN) 300 MG capsule TAKE 1 CAPSULE BY MOUTH EVERYDAY AT BEDTIME 06/21/23   Corwin Levins, MD  loratadine (CLARITIN) 10 MG tablet Take 10 mg by mouth daily.    [provider]  LORazepam (ATIVAN) 1 MG tablet TAKE 1 TABLET BY MOUTH EVERY DAY AS NEEDED FOR ANXIETY 08/25/23   Corwin Levins, MD  losartan (COZAAR) 100 MG tablet Take 1 tablet (100 mg total) by mouth daily. 12/12/22   Corwin Levins, MD  Multiple Vitamin (MULTIVITAMIN WITH MINERALS) TABS tablet Take 1 tablet  by mouth daily.    [provider]  tirzepatide Greggory Keen) 2.5 MG/0.5ML Pen Inject 2.5 mg into the skin once a week. 06/01/23   Corwin Levins, MD  traMADol (ULTRAM) 50 MG tablet TAKE 1 TABLET BY MOUTH TWICE A DAY 09/20/23   Henson, Vickie L, NP-C      Allergies    Patient has no known allergies.    Review of Systems   Review of Systems  Physical Exam Updated Vital Signs BP (!) 164/91   Pulse 72   Temp (!) 97.5 F (36.4 C) (Oral)   Resp 16   Ht 5\' 10"  (1.778 m)   Wt 90 kg   SpO2 95%   BMI 28.47 kg/m  Physical Exam Vitals and nursing note reviewed.  HENT:     Head: Normocephalic.     Comments: Large hematoma to forehead.    Nose: Nose normal.     Mouth/Throat:     Mouth: Mucous membranes are moist.  Eyes:     Extraocular Movements: Extraocular movements intact.     Conjunctiva/sclera: Conjunctivae normal.  Cardiovascular:     Rate and Rhythm: Regular rhythm.  Pulmonary:     Effort: Pulmonary effort is normal.     Breath sounds: Normal breath sounds.  Abdominal:     General: Abdomen is flat. There  is no distension.     Palpations: Abdomen is soft.     Tenderness: There is no abdominal tenderness. There is no guarding or rebound.  Musculoskeletal:     Comments: Chest wall stable nontender.  Pelvis stable nontender. Decreased ROM right shoulder.  Tenderness to palpation as well.  No obvious deformity. Has abrasion to his right anterior shin.  Patient has good grip strength in all extremities.  Skin:    General: Skin is warm and dry.     Capillary Refill: Capillary refill takes less than 2 seconds.     Comments: Patient has a C-shaped laceration on the inner aspect of his right forearm near his elbow.  Approximately 3-1/2 cm long  Neurological:     General: No focal deficit present.     Mental Status: He is alert.  Psychiatric:        Mood and Affect: Mood normal.        Behavior: Behavior normal.     ED Results / Procedures / Treatments   Labs (all  labs ordered are listed, but only abnormal results are displayed) Labs Reviewed  COMPREHENSIVE METABOLIC PANEL - Abnormal; Notable for the following components:      Result Value   CO2 21 (*)    Glucose, Bld 191 (*)    Total Protein 6.3 (*)    Albumin 3.0 (*)    All other components within normal limits  URINALYSIS, ROUTINE W REFLEX MICROSCOPIC - Abnormal; Notable for the following components:   Glucose, UA 50 (*)    Hgb urine dipstick SMALL (*)    All other components within normal limits  I-STAT CHEM 8, ED - Abnormal; Notable for the following components:   Glucose, Bld 193 (*)    Calcium, Ion 1.13 (*)    All other components within normal limits  I-STAT CG4 LACTIC ACID, ED - Abnormal; Notable for the following components:   Lactic Acid, Venous 3.1 (*)    All other components within normal limits  CBC  ETHANOL  PROTIME-INR  SAMPLE TO BLOOD BANK    EKG None  Radiology CT T-SPINE NO CHARGE  Result Date: 09/28/2023 CLINICAL DATA:  Fell from roof, right shoulder pain EXAM: CT Thoracic and Lumbar spine with contrast TECHNIQUE: Multiplanar CT images of the thoracic and lumbar spine were reconstructed from contemporary CT of the Chest, Abdomen, and Pelvis. RADIATION DOSE REDUCTION: This exam was performed according to the departmental dose-optimization program which includes automated exposure control, adjustment of the mA and/or kV according to patient size and/or use of iterative reconstruction technique. CONTRAST:  No additional COMPARISON:  None Available. FINDINGS: CT THORACIC SPINE FINDINGS Alignment: Alignment is anatomic. Vertebrae: No acute fracture or focal pathologic process. Paraspinal and other soft tissues: Negative. Atherosclerosis of the aorta. Visualized portions of the lungs are clear. Disc levels: Mild diffuse thoracic spondylosis greatest in the mid and lower thoracic spine. No significant bony encroachment upon the central canal or neural foramina at any level.  Reconstructed images demonstrate no additional findings. CT LUMBAR SPINE FINDINGS Segmentation: 5 lumbar type vertebrae. Alignment: Alignment is anatomic. Vertebrae: No acute fracture or focal pathologic process. Paraspinal and other soft tissues: The paraspinal soft tissues are unremarkable. Horseshoe kidney is noted. Diffuse aortic atherosclerosis. Disc levels: There is mild diffuse lumbar spondylosis. Changes are most pronounced at L3-4, L4-5, and L5-S1, with superimposed bilateral facet hypertrophy contributing to mild central canal and bilateral neural foraminal encroachment at these levels. Reconstructed images demonstrate no additional findings. IMPRESSION:  1. No evidence of thoracic or lumbar spine fracture. 2. Lower lumbar degenerative changes as above, with mild central canal stenosis and bilateral neural foraminal encroachment from L3-4 through L5-S1. 3. Lower thoracic spondylosis without significant compressive sequela. 4.  Aortic Atherosclerosis (ICD10-I70.0). Electronically Signed   By: Sharlet Salina M.D.   On: 09/28/2023 19:29   CT L-SPINE NO CHARGE  Result Date: 09/28/2023 CLINICAL DATA:  Larey Seat from roof, right shoulder pain EXAM: CT Thoracic and Lumbar spine with contrast TECHNIQUE: Multiplanar CT images of the thoracic and lumbar spine were reconstructed from contemporary CT of the Chest, Abdomen, and Pelvis. RADIATION DOSE REDUCTION: This exam was performed according to the departmental dose-optimization program which includes automated exposure control, adjustment of the mA and/or kV according to patient size and/or use of iterative reconstruction technique. CONTRAST:  No additional COMPARISON:  None Available. FINDINGS: CT THORACIC SPINE FINDINGS Alignment: Alignment is anatomic. Vertebrae: No acute fracture or focal pathologic process. Paraspinal and other soft tissues: Negative. Atherosclerosis of the aorta. Visualized portions of the lungs are clear. Disc levels: Mild diffuse thoracic  spondylosis greatest in the mid and lower thoracic spine. No significant bony encroachment upon the central canal or neural foramina at any level. Reconstructed images demonstrate no additional findings. CT LUMBAR SPINE FINDINGS Segmentation: 5 lumbar type vertebrae. Alignment: Alignment is anatomic. Vertebrae: No acute fracture or focal pathologic process. Paraspinal and other soft tissues: The paraspinal soft tissues are unremarkable. Horseshoe kidney is noted. Diffuse aortic atherosclerosis. Disc levels: There is mild diffuse lumbar spondylosis. Changes are most pronounced at L3-4, L4-5, and L5-S1, with superimposed bilateral facet hypertrophy contributing to mild central canal and bilateral neural foraminal encroachment at these levels. Reconstructed images demonstrate no additional findings. IMPRESSION: 1. No evidence of thoracic or lumbar spine fracture. 2. Lower lumbar degenerative changes as above, with mild central canal stenosis and bilateral neural foraminal encroachment from L3-4 through L5-S1. 3. Lower thoracic spondylosis without significant compressive sequela. 4.  Aortic Atherosclerosis (ICD10-I70.0). Electronically Signed   By: Sharlet Salina M.D.   On: 09/28/2023 19:29   DG Tibia/Fibula Right  Result Date: 09/28/2023 CLINICAL DATA:  Blunt trauma, fall from roof.  Right shin pain. EXAM: RIGHT TIBIA AND FIBULA - 2 VIEW COMPARISON:  None Available. FINDINGS: There is no evidence of fracture or other focal bone lesions. Cortical margins are intact. Knee and ankle alignment are maintained on provided views. Mild generalized soft tissue edema. IMPRESSION: Mild soft tissue edema. No fracture of the right tibia or fibula. Electronically Signed   By: Narda Rutherford M.D.   On: 09/28/2023 19:26   CT CHEST ABDOMEN PELVIS W CONTRAST  Result Date: 09/28/2023 CLINICAL DATA:  Larey Seat off roof, right shoulder pain EXAM: CT CHEST, ABDOMEN, AND PELVIS WITH CONTRAST TECHNIQUE: Multidetector CT imaging of the  chest, abdomen and pelvis was performed following the standard protocol during bolus administration of intravenous contrast. RADIATION DOSE REDUCTION: This exam was performed according to the departmental dose-optimization program which includes automated exposure control, adjustment of the mA and/or kV according to patient size and/or use of iterative reconstruction technique. CONTRAST:  OMNIPAQUE IOHEXOL 300 MG/ML  SOLN COMPARISON:  None Available. FINDINGS: CT CHEST FINDINGS Cardiovascular: The heart and great vessels are unremarkable without pericardial effusion. No evidence of vascular injury. Atherosclerosis of the aorta and coronary vasculature. Mediastinum/Nodes: No enlarged mediastinal, hilar, or axillary lymph nodes. Thyroid gland, trachea, and esophagus demonstrate no significant findings. Fat containing hiatal hernia. Lungs/Pleura: No acute airspace disease, effusion, or pneumothorax.  Central airways are patent. Musculoskeletal: No acute displaced fractures. Reconstructed images demonstrate no additional findings. CT ABDOMEN PELVIS FINDINGS Hepatobiliary: Small calcified gallstones without evidence of acute cholecystitis. Liver is unremarkable without evidence of acute injury. Pancreas: Unremarkable. No pancreatic ductal dilatation or surrounding inflammatory changes. Spleen: 8 mm cyst inferior aspect spleen. No other focal splenic abnormalities or evidence of acute injury. Normal size. Adrenals/Urinary Tract: Horseshoe configuration of the kidneys. Exophytic 2.1 cm simple cyst off the left moiety does not require imaging follow-up. No urinary tract calculi or obstructive uropathy. No evidence of renal injury. The adrenals and bladder are unremarkable. Stomach/Bowel: No bowel obstruction or ileus. Normal appendix right lower quadrant. No bowel wall thickening or inflammatory change. Vascular/Lymphatic: Aortic atherosclerosis. No enlarged abdominal or pelvic lymph nodes. Reproductive: Prostate is  unremarkable. Other: No free fluid or free intraperitoneal gas. Small fat containing right inguinal hernia. No bowel herniation. Musculoskeletal: No acute displaced fractures. Reconstructed images demonstrate no additional findings. IMPRESSION: 1. No acute intrathoracic, intra-abdominal, or intrapelvic trauma. 2. Cholelithiasis without acute cholecystitis. 3.  Aortic Atherosclerosis (ICD10-I70.0). Electronically Signed   By: Sharlet Salina M.D.   On: 09/28/2023 19:25   DG Shoulder Right Port  Result Date: 09/28/2023 CLINICAL DATA:  Blunt trauma, fall from roof.  Right shoulder pain. EXAM: RIGHT SHOULDER - 1 VIEW COMPARISON:  None Available. FINDINGS: There is no evidence of fracture or dislocation. Minor acromioclavicular degenerative spurring. No erosion or suspicious bone lesion. Soft tissues are unremarkable. IMPRESSION: 1. No fracture or dislocation of the right shoulder. 2. Minor acromioclavicular degenerative spurring. Electronically Signed   By: Narda Rutherford M.D.   On: 09/28/2023 19:25   CT HEAD WO CONTRAST  Result Date: 09/28/2023 CLINICAL DATA:  Larey Seat off a roof, landed on head, forehead hematoma, broken 2 EXAM: CT HEAD WITHOUT CONTRAST CT MAXILLOFACIAL WITHOUT CONTRAST CT CERVICAL SPINE WITHOUT CONTRAST TECHNIQUE: Multidetector CT imaging of the head, cervical spine, and maxillofacial structures were performed using the standard protocol without intravenous contrast. Multiplanar CT image reconstructions of the cervical spine and maxillofacial structures were also generated. RADIATION DOSE REDUCTION: This exam was performed according to the departmental dose-optimization program which includes automated exposure control, adjustment of the mA and/or kV according to patient size and/or use of iterative reconstruction technique. COMPARISON:  No prior CT of the head, face, or cervical spine available, correlation is made with 10/02/2015 MRI head FINDINGS: CT HEAD FINDINGS Brain: No evidence of acute  infarct, hemorrhage, mass, mass effect, or midline shift. No hydrocephalus or extra-axial fluid collection. Vascular: No hyperdense vessel. Skull: Negative for fracture or focal lesion. Left frontal scalp and forehead hematoma. CT MAXILLOFACIAL FINDINGS Osseous: No fracture or mandibular dislocation. No destructive process. Periapical lucency about the roots of the mandibular central and lateral incisors. Evaluation for tooth fracture is limited by beam hardening artifact from other dental work. Orbits: No traumatic or inflammatory finding. Sinuses: Mucosal thickening in the ethmoid air cells. Otherwise clear paranasal sinuses. The mastoids are well aerated. Soft tissues: Left frontal scalp and forehead hematoma. Otherwise negative. CT CERVICAL SPINE FINDINGS Alignment: No traumatic listhesis. Skull base and vertebrae: No acute fracture. No primary bone lesion or focal pathologic process. Soft tissues and spinal canal: No prevertebral fluid or swelling. No visible canal hematoma. Disc levels: Degenerative changes in the cervical spine. No high-grade spinal canal stenosis. Upper chest: For findings in the thorax, please see same day CT chest. IMPRESSION: 1. No acute intracranial process. 2. Left frontal scalp and forehead hematoma. No calvarial fracture.  3. No acute facial bone fracture. 4. No acute fracture or traumatic listhesis in the cervical spine. Electronically Signed   By: Wiliam Ke M.D.   On: 09/28/2023 19:20   CT CERVICAL SPINE WO CONTRAST  Result Date: 09/28/2023 CLINICAL DATA:  Larey Seat off a roof, landed on head, forehead hematoma, broken 2 EXAM: CT HEAD WITHOUT CONTRAST CT MAXILLOFACIAL WITHOUT CONTRAST CT CERVICAL SPINE WITHOUT CONTRAST TECHNIQUE: Multidetector CT imaging of the head, cervical spine, and maxillofacial structures were performed using the standard protocol without intravenous contrast. Multiplanar CT image reconstructions of the cervical spine and maxillofacial structures were also  generated. RADIATION DOSE REDUCTION: This exam was performed according to the departmental dose-optimization program which includes automated exposure control, adjustment of the mA and/or kV according to patient size and/or use of iterative reconstruction technique. COMPARISON:  No prior CT of the head, face, or cervical spine available, correlation is made with 10/02/2015 MRI head FINDINGS: CT HEAD FINDINGS Brain: No evidence of acute infarct, hemorrhage, mass, mass effect, or midline shift. No hydrocephalus or extra-axial fluid collection. Vascular: No hyperdense vessel. Skull: Negative for fracture or focal lesion. Left frontal scalp and forehead hematoma. CT MAXILLOFACIAL FINDINGS Osseous: No fracture or mandibular dislocation. No destructive process. Periapical lucency about the roots of the mandibular central and lateral incisors. Evaluation for tooth fracture is limited by beam hardening artifact from other dental work. Orbits: No traumatic or inflammatory finding. Sinuses: Mucosal thickening in the ethmoid air cells. Otherwise clear paranasal sinuses. The mastoids are well aerated. Soft tissues: Left frontal scalp and forehead hematoma. Otherwise negative. CT CERVICAL SPINE FINDINGS Alignment: No traumatic listhesis. Skull base and vertebrae: No acute fracture. No primary bone lesion or focal pathologic process. Soft tissues and spinal canal: No prevertebral fluid or swelling. No visible canal hematoma. Disc levels: Degenerative changes in the cervical spine. No high-grade spinal canal stenosis. Upper chest: For findings in the thorax, please see same day CT chest. IMPRESSION: 1. No acute intracranial process. 2. Left frontal scalp and forehead hematoma. No calvarial fracture. 3. No acute facial bone fracture. 4. No acute fracture or traumatic listhesis in the cervical spine. Electronically Signed   By: Wiliam Ke M.D.   On: 09/28/2023 19:20   CT MAXILLOFACIAL WO CONTRAST  Result Date:  09/28/2023 CLINICAL DATA:  Larey Seat off a roof, landed on head, forehead hematoma, broken 2 EXAM: CT HEAD WITHOUT CONTRAST CT MAXILLOFACIAL WITHOUT CONTRAST CT CERVICAL SPINE WITHOUT CONTRAST TECHNIQUE: Multidetector CT imaging of the head, cervical spine, and maxillofacial structures were performed using the standard protocol without intravenous contrast. Multiplanar CT image reconstructions of the cervical spine and maxillofacial structures were also generated. RADIATION DOSE REDUCTION: This exam was performed according to the departmental dose-optimization program which includes automated exposure control, adjustment of the mA and/or kV according to patient size and/or use of iterative reconstruction technique. COMPARISON:  No prior CT of the head, face, or cervical spine available, correlation is made with 10/02/2015 MRI head FINDINGS: CT HEAD FINDINGS Brain: No evidence of acute infarct, hemorrhage, mass, mass effect, or midline shift. No hydrocephalus or extra-axial fluid collection. Vascular: No hyperdense vessel. Skull: Negative for fracture or focal lesion. Left frontal scalp and forehead hematoma. CT MAXILLOFACIAL FINDINGS Osseous: No fracture or mandibular dislocation. No destructive process. Periapical lucency about the roots of the mandibular central and lateral incisors. Evaluation for tooth fracture is limited by beam hardening artifact from other dental work. Orbits: No traumatic or inflammatory finding. Sinuses: Mucosal thickening in the ethmoid air  cells. Otherwise clear paranasal sinuses. The mastoids are well aerated. Soft tissues: Left frontal scalp and forehead hematoma. Otherwise negative. CT CERVICAL SPINE FINDINGS Alignment: No traumatic listhesis. Skull base and vertebrae: No acute fracture. No primary bone lesion or focal pathologic process. Soft tissues and spinal canal: No prevertebral fluid or swelling. No visible canal hematoma. Disc levels: Degenerative changes in the cervical spine. No  high-grade spinal canal stenosis. Upper chest: For findings in the thorax, please see same day CT chest. IMPRESSION: 1. No acute intracranial process. 2. Left frontal scalp and forehead hematoma. No calvarial fracture. 3. No acute facial bone fracture. 4. No acute fracture or traumatic listhesis in the cervical spine. Electronically Signed   By: Wiliam Ke M.D.   On: 09/28/2023 19:20    Procedures .Marland KitchenLaceration Repair  Date/Time: 09/28/2023 8:24 PM  Performed by: Coral Spikes, DO Authorized by: Coral Spikes, DO   Consent:    Consent obtained:  Verbal   Consent given by:  Patient   Risks discussed:  Infection, pain, poor cosmetic result, retained foreign body, tendon damage and vascular damage   Alternatives discussed:  No treatment Universal protocol:    Procedure explained and questions answered to patient or proxy's satisfaction: yes     Patient identity confirmed:  Verbally with patient Anesthesia:    Anesthesia method:  Local infiltration   Local anesthetic:  Lidocaine 2% WITH epi Laceration details:    Location:  Shoulder/arm   Shoulder/arm location:  R elbow   Length (cm):  4 Exploration:    Hemostasis achieved with:  Epinephrine   Imaging outcome: foreign body not noted     Wound exploration: wound explored through full range of motion and entire depth of wound visualized   Treatment:    Area cleansed with:  Saline   Amount of cleaning:  Standard Skin repair:    Repair method:  Sutures   Suture size:  4-0   Suture material:  Fast-absorbing gut   Suture technique:  Simple interrupted   Number of sutures:  9 Approximation:    Approximation:  Close Repair type:    Repair type:  Simple Post-procedure details:    Dressing:  Non-adherent dressing   Procedure completion:  Tolerated     Medications Ordered in ED Medications  fentaNYL (SUBLIMAZE) injection 50 mcg (50 mcg Intravenous Given 09/28/23 1647)  Tdap (BOOSTRIX) injection 0.5 mL (0.5 mLs Intramuscular  Given 09/28/23 1647)  iohexol (OMNIPAQUE) 300 MG/ML solution 100 mL (100 mLs Intravenous Contrast Given 09/28/23 1655)  fentaNYL (SUBLIMAZE) injection 50 mcg (50 mcg Intravenous Given 09/28/23 1721)  sodium chloride 0.9 % bolus 1,000 mL (0 mLs Intravenous Stopped 09/28/23 1950)  lidocaine-EPINEPHrine (XYLOCAINE W/EPI) 2 %-1:200000 (PF) injection 10 mL (10 mLs Infiltration Given by Other 09/28/23 1954)  oxyCODONE-acetaminophen (PERCOCET/ROXICET) 5-325 MG per tablet 1 tablet (1 tablet Oral Given 09/28/23 1953)    ED Course/ Medical Decision Making/ A&P                                 Medical Decision Making 62 year old male to the emergency department after a fall from a ladder.  Complains of pain to right shoulder shin and forehead.  Hemodynamically stable here in the emergency department.  His age and mechanism.  Advanced imaging obtained.  No acute pathology identified.  Has laceration to right forearm.  Repaired bedside.  See ED note.  Patient has some chronic pain per  chart review.  Received IV fentanyl for pain control.  Tetanus was updated.  Basic trauma labs with mild elevated lactate.  Received IV fluids.  No other significant metabolic derangements.  No transaminitis or elevated creatinine to suggest acute intra-abdominal trauma.  He is tolerating p.o. here in the emergency department.  No anemia.  He is having some some continued right shoulder pain, concern for possible rotator cuff injury.  Offered patient patient in sling for comfort, but he declined stating that he has things at home. Patient stable for discharge.  Amount and/or Complexity of Data Reviewed Labs: ordered. Radiology: ordered.  Risk Prescription drug management.          Final Clinical Impression(s) / ED Diagnoses Final diagnoses:  Fall, initial encounter  Contusion of face, initial encounter  Laceration of right forearm, initial encounter    Rx / DC Orders ED Discharge Orders     None         Coral Spikes, DO 09/28/23 2023    Coral Spikes, DO 09/28/23 2025

## 2023-10-03 ENCOUNTER — Encounter: Payer: Self-pay | Admitting: Internal Medicine

## 2023-10-18 ENCOUNTER — Other Ambulatory Visit: Payer: Self-pay | Admitting: Family Medicine

## 2023-10-18 DIAGNOSIS — Z23 Encounter for immunization: Secondary | ICD-10-CM

## 2023-11-23 ENCOUNTER — Other Ambulatory Visit: Payer: Self-pay | Admitting: Internal Medicine

## 2023-11-26 ENCOUNTER — Telehealth: Payer: Managed Care, Other (non HMO) | Admitting: Physician Assistant

## 2023-11-26 ENCOUNTER — Other Ambulatory Visit: Payer: Self-pay | Admitting: Internal Medicine

## 2023-11-26 DIAGNOSIS — L03119 Cellulitis of unspecified part of limb: Secondary | ICD-10-CM

## 2023-11-26 DIAGNOSIS — S81819A Laceration without foreign body, unspecified lower leg, initial encounter: Secondary | ICD-10-CM

## 2023-11-26 DIAGNOSIS — Z23 Encounter for immunization: Secondary | ICD-10-CM

## 2023-11-27 NOTE — Progress Notes (Signed)
 Because of the appearance of the wound this may need to be debrided some, I feel your condition warrants further evaluation and I recommend that you be seen in a face to face visit.   NOTE: There will be NO CHARGE for this eVisit   If you are having a true medical emergency please call 911.      For an urgent face to face visit, [facility] has eight urgent care centers for your convenience:   NEW!! [facility] at [facility] Get Driving Directions [phone] [address] [place], [address]    [facility] Health Urgent Care Center at [facility] Get Driving Directions [phone] [address] [place], [place] [address]   [facility] Health Urgent Care Center [facility]) Get Driving Directions [phone] [address] [place], [place] [address]  [facility] Health Urgent Care Center [facility]) Get Driving Directions [phone] [address] [place],  [place]  [address]  [facility] Health Ur[facility] Care Center [facility] - at [employer]  [phone] [address] [place],  [place] [address]   [facility] Health Urgent Care at [facility] Get Driving Directions [phone] [address] [place], [place] [address]   [facility] Health Urgent Care at [facility] Get Driving Directions  [phone] [address] [place], [place] [address]   [facility] Health Urgent Care at [facility] Directions [phone] [address] [place], [place] [address]  Your MyChart E-visit questionnaire answers were reviewed by a board certified advanced clinical practitioner to complete your personal care plan based on your specific symptoms.  Thank you for using e-Visits.     I have spent 5 minutes in review of e-visit questionnaire, review and updating patient chart, medical decision making and response to patient.   Delon CHRISTELLA Dickinson, PA-C

## 2023-12-01 ENCOUNTER — Ambulatory Visit: Payer: Commercial Managed Care - HMO | Admitting: Family Medicine

## 2023-12-01 NOTE — Progress Notes (Deleted)
   Acute Office Visit  Subjective:     Patient ID: Tommy May, male    DOB: 1961/07/07, 63 y.o.   MRN: [id number]  No chief complaint on file.   HPI Patient is in today for ***  ROS Per HPI      Objective:    There were no vitals taken for this visit.   Physical Exam Vitals and nursing note reviewed.  Constitutional:      Appearance: Normal appearance.  HENT:     Head: Normocephalic and atraumatic.  Eyes:     Extraocular Movements: Extraocular movements intact.  Cardiovascular:     Rate and Rhythm: Normal rate and regular rhythm.     Pulses: Normal pulses.     Heart sounds: Normal heart sounds.  Pulmonary:     Effort: Pulmonary effort is normal.     Breath sounds: Normal breath sounds.  Musculoskeletal:        General: Normal range of motion.     Cervical back: Normal range of motion.  Skin:    General: Skin is warm and dry.  Neurological:     General: No focal deficit present.     Mental Status: He is alert and oriented to person, place, and time.  Psychiatric:        Mood and Affect: Mood normal.        Behavior: Behavior normal.   No results found for any visits on 12/01/23.      Assessment & Plan:  ***  No orders of the defined types were placed in this encounter.   No follow-ups on file.  Corean Ku, FNP

## 2023-12-04 ENCOUNTER — Ambulatory Visit (INDEPENDENT_AMBULATORY_CARE_PROVIDER_SITE_OTHER): Payer: Commercial Managed Care - HMO

## 2023-12-04 ENCOUNTER — Ambulatory Visit: Payer: Commercial Managed Care - HMO | Admitting: Family Medicine

## 2023-12-04 VITALS — BP 158/92 | HR 85 | Temp 98.9°F | Ht 70.0 in | Wt 208.4 lb

## 2023-12-04 DIAGNOSIS — L03115 Cellulitis of right lower limb: Secondary | ICD-10-CM

## 2023-12-04 DIAGNOSIS — S81801A Unspecified open wound, right lower leg, initial encounter: Secondary | ICD-10-CM | POA: Diagnosis not present

## 2023-12-04 DIAGNOSIS — R739 Hyperglycemia, unspecified: Secondary | ICD-10-CM

## 2023-12-04 LAB — CBC WITH DIFFERENTIAL/PLATELET
Basophils Absolute: 0 10*3/uL (ref 0.0–0.1)
Basophils Relative: 0.4 % (ref 0.0–3.0)
Eosinophils Absolute: 0.1 10*3/uL (ref 0.0–0.7)
Eosinophils Relative: 1.2 % (ref 0.0–5.0)
HCT: 47.2 % (ref 39.0–52.0)
Hemoglobin: 16 g/dL (ref 13.0–17.0)
Lymphocytes Relative: 25.6 % (ref 12.0–46.0)
Lymphs Abs: 2.1 10*3/uL (ref 0.7–4.0)
MCHC: 33.8 g/dL (ref 30.0–36.0)
MCV: 88.2 fL (ref 78.0–100.0)
Monocytes Absolute: 0.8 10*3/uL (ref 0.1–1.0)
Monocytes Relative: 10.1 % (ref 3.0–12.0)
Neutro Abs: 5.2 10*3/uL (ref 1.4–7.7)
Neutrophils Relative %: 62.7 % (ref 43.0–77.0)
Platelets: 192 10*3/uL (ref 150.0–400.0)
RBC: 5.35 Mil/uL (ref 4.22–5.81)
RDW: 12.5 % (ref 11.5–15.5)
WBC: 8.3 10*3/uL (ref 4.0–10.5)

## 2023-12-04 LAB — COMPREHENSIVE METABOLIC PANEL
ALT: 28 U/L (ref 0–53)
AST: 18 U/L (ref 0–37)
Albumin: 3.5 g/dL (ref 3.5–5.2)
Alkaline Phosphatase: 88 U/L (ref 39–117)
BUN: 9 mg/dL (ref 6–23)
CO2: 29 meq/L (ref 19–32)
Calcium: 8.5 mg/dL (ref 8.4–10.5)
Chloride: 99 meq/L (ref 96–112)
Creatinine, Ser: 0.81 mg/dL (ref 0.40–1.50)
GFR: 93.03 mL/min (ref >60.00)
Glucose, Bld: 315 mg/dL — ABNORMAL HIGH (ref 70–99)
Potassium: 4.5 meq/L (ref 3.5–5.1)
Sodium: 134 meq/L — ABNORMAL LOW (ref 135–145)
Total Bilirubin: 0.5 mg/dL (ref 0.2–1.2)
Total Protein: 6.5 g/dL (ref 6.0–8.3)

## 2023-12-04 MED ORDER — CEFTRIAXONE SODIUM 1 G IJ SOLR
1.0000 g | Freq: Once | INTRAMUSCULAR | Status: AC
  Administered 2023-12-04: 1 g via INTRAMUSCULAR

## 2023-12-04 MED ORDER — CLINDAMYCIN HCL 150 MG PO CAPS: 150.0000 mg | ORAL_CAPSULE | Freq: Three times a day (TID) | ORAL | 0 refills | Status: AC

## 2023-12-04 NOTE — Patient Instructions (Addendum)
 We are getting an xray today. We will be in contact with any abnormal results that require further attention.  We are checking labs today, will be in contact with any results that require further attention  You have received Rocephin  in the office today.  I have sent in clindamycin  for you to take three times per day for 7 days.    Follow up with us  for re evaluation on Thursday or Friday this week. Let's make sure that your leg is actually healing.   Continue to keep your leg clean and dressed as you have been doing at home.  I have sent in a referral to wound care. Someone will be reaching out to get you scheduled.   I have also sent in a referral for a specialized ultrasound to check for adequate circulation in your legs. Someone will be reaching out to get you scheduled.   If your xray looks like the infection has gotten to your bone or if your white count is high, I will be calling you and you will need to go to the ER for further evaluation and treatment.

## 2023-12-04 NOTE — Progress Notes (Signed)
 Acute Office Visit  Subjective:     Patient ID: Tommy May, male    DOB: [birth year], 63 y.o.   MRN: [id number]  Chief Complaint  Patient presents with   Laceration    Laceration on left leg due to fall from ladder. Laceration still remaining on leg for the past 2-3 months. Patient was in prediabetic range in the past.    HPI Patient is in today for evaluation of wound to the R lower leg. Reports deep gash to anterior RLE, draining clear fluid, surrounding area red, swollen. Denies pain. Has been cleaning and dressing the wound at home. Reports that he fell off of a ladder in early November 2024 and that the wound is not healing.  Was seen in the ER on 09/28/23 when the incident occurred, normal xray of the leg. Reviewed by me. Was seen and treated for cellulitis by Urgent Care with Bactrim. States this did not help. Denies abdominal pain, nausea, vomiting, diarrhea, rash, chills, fever, other symptoms. Medical hx as outlined below.  ROS Per HPI      Objective:    BP (!) 158/92   Pulse 85   Temp 98.9 F (37.2 C)   Ht 5' 10 (1.778 m)   Wt 208 lb 6.4 oz (94.5 kg)   SpO2 98%   BMI 29.90 kg/m    Physical Exam Vitals and nursing note reviewed.  Constitutional:      General: He is not in acute distress.    Appearance: Normal appearance.  HENT:     Head: Normocephalic and atraumatic.  Eyes:     Extraocular Movements: Extraocular movements intact.  Cardiovascular:     Rate and Rhythm: Normal rate and regular rhythm.     Pulses: Normal pulses.     Heart sounds: Normal heart sounds.  Pulmonary:     Effort: Pulmonary effort is normal.     Breath sounds: Normal breath sounds.  Musculoskeletal:        General: Normal range of motion.     Cervical back: Normal range of motion.  Skin:    Capillary Refill: Capillary refill takes less than 2 seconds.  Neurological:     General: No focal deficit present.     Mental Status: He is alert and oriented to person, place,  and time.  Psychiatric:        Mood and Affect: Mood normal.        Behavior: Behavior normal.     Results for orders placed or performed in visit on 12/04/23  CBC with Differential/Platelet  Result Value Ref Range   WBC 8.3 4.0 - 10.5 K/uL   RBC 5.35 4.22 - 5.81 Mil/uL   Hemoglobin 16.0 13.0 - 17.0 g/dL   HCT 52.7 60.9 - 47.9 %   MCV 88.2 78.0 - 100.0 fl   MCHC 33.8 30.0 - 36.0 g/dL   RDW 87.4 88.4 - 84.4 %   Platelets 192.0 150.0 - 400.0 K/uL   Neutrophils Relative % 62.7 43.0 - 77.0 %   Lymphocytes Relative 25.6 12.0 - 46.0 %   Monocytes Relative 10.1 3.0 - 12.0 %   Eosinophils Relative 1.2 0.0 - 5.0 %   Basophils Relative 0.4 0.0 - 3.0 %   Neutro Abs 5.2 1.4 - 7.7 K/uL   Lymphs Abs 2.1 0.7 - 4.0 K/uL   Monocytes Absolute 0.8 0.1 - 1.0 K/uL   Eosinophils Absolute 0.1 0.0 - 0.7 K/uL   Basophils Absolute 0.0 0.0 - 0.1 K/uL  Comprehensive metabolic panel  Result Value Ref Range   Sodium 134 (L) 135 - 145 mEq/L   Potassium 4.5 3.5 - 5.1 mEq/L   Chloride 99 96 - 112 mEq/L   CO2 29 19 - 32 mEq/L   Glucose, Bld 315 (H) 70 - 99 mg/dL   BUN 9 6 - 23 mg/dL   Creatinine, Ser 9.18 0.40 - 1.50 mg/dL   Total Bilirubin 0.5 0.2 - 1.2 mg/dL   Alkaline Phosphatase 88 39 - 117 U/L   AST 18 0 - 37 U/L   ALT 28 0 - 53 U/L   Total Protein 6.5 6.0 - 8.3 g/dL   Albumin 3.5 3.5 - 5.2 g/dL   GFR 06.96 >39.99 mL/min   Calcium  8.5 8.4 - 10.5 mg/dL        Assessment & Plan:  1. Cellulitis of right lower extremity (Primary)  - DG Tibia/Fibula Right; Future - CBC with Differential/Platelet - Comprehensive metabolic panel - Ambulatory referral to Wound Clinic - clindamycin  (CLEOCIN ) 150 MG capsule; Take 1 capsule (150 mg total) by mouth 3 (three) times daily for 7 days.  Dispense: 21 capsule; Refill: 0 - cefTRIAXone  (ROCEPHIN ) injection 1 g  2. Non-healing wound of right lower extremity  - US  ARTERIAL ABI (SCREENING LOWER EXTREMITY); Future - clindamycin  (CLEOCIN ) 150 MG capsule;  Take 1 capsule (150 mg total) by mouth 3 (three) times daily for 7 days.  Dispense: 21 capsule; Refill: 0 - cefTRIAXone  (ROCEPHIN ) injection 1 g  3. Hyperglycemia  - HgB A1c   Meds ordered this encounter  Medications   clindamycin  (CLEOCIN ) 150 MG capsule    Sig: Take 1 capsule (150 mg total) by mouth 3 (three) times daily for 7 days.    Dispense:  21 capsule    Refill:  0   cefTRIAXone  (ROCEPHIN ) injection 1 g   Follow up with us  on Thursday/Friday this week.  Corean Ku, FNP

## 2023-12-05 ENCOUNTER — Other Ambulatory Visit: Payer: Self-pay | Admitting: Family Medicine

## 2023-12-05 ENCOUNTER — Ambulatory Visit (INDEPENDENT_AMBULATORY_CARE_PROVIDER_SITE_OTHER): Payer: Commercial Managed Care - HMO

## 2023-12-05 DIAGNOSIS — E119 Type 2 diabetes mellitus without complications: Secondary | ICD-10-CM

## 2023-12-05 DIAGNOSIS — R739 Hyperglycemia, unspecified: Secondary | ICD-10-CM

## 2023-12-05 LAB — HEMOGLOBIN A1C: Hgb A1c MFr Bld: 10 % — ABNORMAL HIGH (ref 4.6–6.5)

## 2023-12-15 ENCOUNTER — Other Ambulatory Visit: Payer: Self-pay

## 2023-12-15 ENCOUNTER — Other Ambulatory Visit: Payer: Self-pay | Admitting: Internal Medicine

## 2023-12-16 ENCOUNTER — Other Ambulatory Visit: Payer: Self-pay | Admitting: Internal Medicine

## 2023-12-18 ENCOUNTER — Other Ambulatory Visit: Payer: Self-pay

## 2023-12-27 ENCOUNTER — Other Ambulatory Visit: Payer: Self-pay | Admitting: Internal Medicine

## 2023-12-27 DIAGNOSIS — Z23 Encounter for immunization: Secondary | ICD-10-CM

## 2024-01-08 ENCOUNTER — Other Ambulatory Visit: Payer: Self-pay | Admitting: Internal Medicine

## 2024-01-12 ENCOUNTER — Other Ambulatory Visit: Payer: Self-pay | Admitting: Internal Medicine

## 2024-01-12 ENCOUNTER — Other Ambulatory Visit: Payer: Self-pay | Admitting: Family Medicine

## 2024-01-12 ENCOUNTER — Other Ambulatory Visit: Payer: Self-pay

## 2024-01-12 DIAGNOSIS — Z23 Encounter for immunization: Secondary | ICD-10-CM

## 2024-01-12 DIAGNOSIS — L03115 Cellulitis of right lower limb: Secondary | ICD-10-CM

## 2024-01-12 DIAGNOSIS — S81801A Unspecified open wound, right lower leg, initial encounter: Secondary | ICD-10-CM

## 2024-01-18 ENCOUNTER — Other Ambulatory Visit: Payer: Self-pay | Admitting: Internal Medicine

## 2024-01-18 DIAGNOSIS — Z23 Encounter for immunization: Secondary | ICD-10-CM

## 2024-01-23 ENCOUNTER — Ambulatory Visit (HOSPITAL_BASED_OUTPATIENT_CLINIC_OR_DEPARTMENT_OTHER): Payer: Commercial Managed Care - HMO | Admitting: Internal Medicine

## 2024-02-06 ENCOUNTER — Other Ambulatory Visit: Payer: Self-pay | Admitting: Internal Medicine

## 2024-02-06 ENCOUNTER — Other Ambulatory Visit: Payer: Self-pay

## 2024-02-06 DIAGNOSIS — Z23 Encounter for immunization: Secondary | ICD-10-CM

## 2024-02-07 ENCOUNTER — Ambulatory Visit (HOSPITAL_COMMUNITY)
Admission: RE | Admit: 2024-02-07 | Discharge: 2024-02-07 | Disposition: A | Discharge status: Home / Self Care | Payer: Commercial Managed Care - HMO | Source: Ambulatory Visit | Attending: Family Medicine | Admitting: Family Medicine

## 2024-02-07 DIAGNOSIS — S81801A Unspecified open wound, right lower leg, initial encounter: Secondary | ICD-10-CM | POA: Insufficient documentation

## 2024-02-07 DIAGNOSIS — L03115 Cellulitis of right lower limb: Secondary | ICD-10-CM | POA: Diagnosis not present

## 2024-02-08 LAB — VAS US ABI WITH/WO TBI
Left ABI: 1.13
Right ABI: 1.11

## 2024-02-20 ENCOUNTER — Other Ambulatory Visit: Payer: Self-pay | Admitting: Internal Medicine

## 2024-04-12 ENCOUNTER — Other Ambulatory Visit: Payer: Self-pay | Admitting: Internal Medicine

## 2024-04-12 DIAGNOSIS — Z23 Encounter for immunization: Secondary | ICD-10-CM

## 2024-04-29 ENCOUNTER — Ambulatory Visit: Admitting: Family Medicine

## 2024-04-30 ENCOUNTER — Ambulatory Visit: Payer: Self-pay

## 2024-04-30 NOTE — Telephone Encounter (Signed)
 Copied from CRM [id number]. Topic: Clinical - Red Word Triage >> Apr 30, 2024 11:35 AM [place] J wrote: [facility] that prompted transfer to Nurse Triage: Patient stated that he is lethargic and is having severe night sweats. He is also experiencing nausea. He thinks it's related to his diabetes. Answer Assessment - Initial Assessment Questions 1. ONSET: "When did the sweating start?"      Several weeks ago 2. LOCATION: "What part of your body has excessive sweating?" (e.g., entire body; just face, underarms, palms, or soles of feet).      Whole body 3. SEVERITY: "How bad is the sweating?"    (Scale 1-10; or mild, moderate, severe)   -  MILD (1-3): Sweating doesn't interfere with normal activities.    -  MODERATE (4-7): Sweating interferes with normal activities (e.g., work or school) or awakens from sleep; causes embarrassment in social situations.    -  SEVERE (8-10): Drenching sweat and has to change bed clothes or bed linens.  severe   - NIGHT SWEATS: Drenching sweat that occurs at night and has to change bed clothes or bed sheets.     yes 4. CAUSE: "What do you think is causing the sweating?"     diabetes 5. FEVER: "Have you been having fevers?" If Yes, ask: "What is your temperature, how was it measured, and when did it start?"     Not sure  6. OTHER SYMPTOMS: "Do you have any other symptoms?" (e.g., chest pain, difficulty breathing, lightheadedness, weight loss)     Has slight nausea most of time.       Pt states this past Friday night and pt soaked through clothes and bedding. Pt feels it is related to diabetes and has cut back on sugars and feels it has helped.  Protocols used: [facility]

## 2024-04-30 NOTE — Telephone Encounter (Signed)
 FYI Only or Action Required?: Action required by provider  Patient was last seen in primary care on 12/04/2023 by Wellington Half, FNP. Called Nurse Triage reporting Night Sweats. Symptoms began several days ago. Interventions attempted: Rest, hydration, or home remedies. Symptoms are: stable.  Triage Disposition: See PCP within 3 days  Patient/caregiver understands and will follow disposition?: yes    CAL confirmed Medicare number with no care will work for this appt only. Note made in chart. RN explained to pt the need to request new card. Pt verbalized understanding.    Reason for Disposition  [1] MODERATE sweating (e.g., interferes with normal activities; causes embarrassment) AND [2] possibly related to new medicine or change in medication dosage  Protocols used: [facility]

## 2024-05-02 ENCOUNTER — Encounter: Payer: Self-pay | Admitting: Family Medicine

## 2024-05-02 ENCOUNTER — Ambulatory Visit (INDEPENDENT_AMBULATORY_CARE_PROVIDER_SITE_OTHER): Admitting: Family Medicine

## 2024-05-02 VITALS — BP 118/70 | HR 73 | Temp 98.8°F | Ht 70.0 in | Wt 186.0 lb

## 2024-05-02 DIAGNOSIS — R6883 Chills (without fever): Secondary | ICD-10-CM | POA: Diagnosis not present

## 2024-05-02 DIAGNOSIS — R634 Abnormal weight loss: Secondary | ICD-10-CM | POA: Diagnosis not present

## 2024-05-02 DIAGNOSIS — R61 Generalized hyperhidrosis: Secondary | ICD-10-CM | POA: Diagnosis not present

## 2024-05-02 DIAGNOSIS — E559 Vitamin D deficiency, unspecified: Secondary | ICD-10-CM

## 2024-05-02 DIAGNOSIS — E1165 Type 2 diabetes mellitus with hyperglycemia: Secondary | ICD-10-CM | POA: Diagnosis not present

## 2024-05-02 DIAGNOSIS — R3589 Other polyuria: Secondary | ICD-10-CM

## 2024-05-02 DIAGNOSIS — R631 Polydipsia: Secondary | ICD-10-CM

## 2024-05-02 LAB — COMPREHENSIVE METABOLIC PANEL WITH GFR
ALT: 15 U/L (ref 0–53)
AST: 16 U/L (ref 0–37)
Albumin: 3.4 g/dL — ABNORMAL LOW (ref 3.5–5.2)
Alkaline Phosphatase: 73 U/L (ref 39–117)
BUN: 7 mg/dL (ref 6–23)
CO2: 26 meq/L (ref 19–32)
Calcium: 8.9 mg/dL (ref 8.4–10.5)
Chloride: 104 meq/L (ref 96–112)
Creatinine, Ser: 0.77 mg/dL (ref 0.40–1.50)
GFR: 94.19 mL/min (ref >60.00)
Glucose, Bld: 146 mg/dL — ABNORMAL HIGH (ref 70–99)
Potassium: 3.9 meq/L (ref 3.5–5.1)
Sodium: 138 meq/L (ref 135–145)
Total Bilirubin: 0.8 mg/dL (ref 0.2–1.2)
Total Protein: 6.8 g/dL (ref 6.0–8.3)

## 2024-05-02 LAB — MICROALBUMIN / CREATININE URINE RATIO
Creatinine,U: 126.8 mg/dL
Microalb Creat Ratio: UNDETERMINED mg/g (ref 0.0–30.0)
Microalb, Ur: <0.7 mg/dL

## 2024-05-02 LAB — HEMOGLOBIN A1C: Hgb A1c MFr Bld: 6.7 % — ABNORMAL HIGH (ref 4.6–6.5)

## 2024-05-02 LAB — CBC WITH DIFFERENTIAL/PLATELET
Basophils Absolute: 0 10*3/uL (ref 0.0–0.1)
Basophils Relative: 0.4 % (ref 0.0–3.0)
Eosinophils Absolute: 0.1 10*3/uL (ref 0.0–0.7)
Eosinophils Relative: 2.1 % (ref 0.0–5.0)
HCT: 44.4 % (ref 39.0–52.0)
Hemoglobin: 15 g/dL (ref 13.0–17.0)
Lymphocytes Relative: 24.6 % (ref 12.0–46.0)
Lymphs Abs: 1.1 10*3/uL (ref 0.7–4.0)
MCHC: 33.8 g/dL (ref 30.0–36.0)
MCV: 87.2 fl (ref 78.0–100.0)
Monocytes Absolute: 0.4 10*3/uL (ref 0.1–1.0)
Monocytes Relative: 8.6 % (ref 3.0–12.0)
Neutro Abs: 2.9 10*3/uL (ref 1.4–7.7)
Neutrophils Relative %: 64.3 % (ref 43.0–77.0)
Platelets: 246 10*3/uL (ref 150.0–400.0)
RBC: 5.09 Mil/uL (ref 4.22–5.81)
RDW: 13 % (ref 11.5–15.5)
WBC: 4.5 10*3/uL (ref 4.0–10.5)

## 2024-05-02 NOTE — Progress Notes (Signed)
 Acute Office Visit  Subjective:     Patient ID: Tommy May, male    DOB: November 07, 1961, 63 y.o.   MRN: [id number]  Chief Complaint  Patient presents with   Acute Visit    Pt states night sweat x10 day, last Friday severe sweat through several sheets lasting several hours with chills.Aaron AasAaron AasAaron AasAaron Aas wound on right calf looks better.    HPI Patient is in today for evaluation of night sweats, chills, increased thirst, increased urination, weight loss for the last couple of months. Reports that he noticed his A1c on MyChart was over 10.  States that anytime he has had an A1c over 6 he normally takes metformin , and then stops when it comes back down. Denies known sick contacts.  Thinks this is all related to diabetes. Denies other concerns today. Denies  ROS Per HPI      Objective:    BP 118/70   Pulse 73   Temp 98.8 F (37.1 C) (Temporal)   Ht 5' 10 (1.778 m)   Wt 186 lb (84.4 kg)   SpO2 92%   BMI 26.69 kg/m    Physical Exam Vitals and nursing note reviewed.  Constitutional:      General: He is not in acute distress.    Appearance: Normal appearance.     Comments: Appears fatigued   HENT:     Head: Normocephalic and atraumatic.     Right Ear: External ear normal.     Left Ear: External ear normal.     Nose: Nose normal.     Mouth/Throat:     Mouth: Mucous membranes are moist.     Pharynx: Oropharynx is clear.   Eyes:     Extraocular Movements: Extraocular movements intact.    Cardiovascular:     Rate and Rhythm: Normal rate and regular rhythm.     Pulses: Normal pulses.     Heart sounds: Normal heart sounds.  Pulmonary:     Effort: Pulmonary effort is normal. No respiratory distress.     Breath sounds: Normal breath sounds. No wheezing, rhonchi or rales.   Musculoskeletal:        General: Normal range of motion.     Cervical back: Normal range of motion.     Right lower leg: Edema (+1) present.     Left lower leg: Edema (+1) present.  Lymphadenopathy:      Cervical: No cervical adenopathy.   Skin:    General: Skin is warm and dry.   Neurological:     General: No focal deficit present.     Mental Status: He is alert and oriented to person, place, and time.   Psychiatric:        Mood and Affect: Mood normal.        Behavior: Behavior normal.    No results found for any visits on 05/02/24.      Assessment & Plan:   Type 2 diabetes mellitus with hyperglycemia, without long-term current use of insulin (HCC) -     Comprehensive metabolic panel with GFR -     Hemoglobin A1c -     Microalbumin / creatinine urine ratio  Night sweats -     CBC with Differential/Platelet -     Comprehensive metabolic panel with GFR -     TSH -     VITAMIN D  25 Hydroxy (Vit-D Deficiency, Fractures) -     Vitamin B12 -     Hemoglobin A1c -     Microalbumin /  creatinine urine ratio  Chills -     CBC with Differential/Platelet -     Comprehensive metabolic panel with GFR -     TSH -     VITAMIN D  25 Hydroxy (Vit-D Deficiency, Fractures) -     Vitamin B12 -     Hemoglobin A1c -     Microalbumin / creatinine urine ratio  Weight loss -     CBC with Differential/Platelet -     Comprehensive metabolic panel with GFR -     TSH -     VITAMIN D  25 Hydroxy (Vit-D Deficiency, Fractures) -     Vitamin B12 -     Hemoglobin A1c -     Microalbumin / creatinine urine ratio  Vitamin D  deficiency -     VITAMIN D  25 Hydroxy (Vit-D Deficiency, Fractures)  Polydipsia -     Comprehensive metabolic panel with GFR -     Hemoglobin A1c -     Microalbumin / creatinine urine ratio  Polyuria -     Comprehensive metabolic panel with GFR -     Hemoglobin A1c -     Microalbumin / creatinine urine ratio   If kidney function is ok, will start metformin  ER 500mg  BID x 7 days, then increase to metformin  ER 1000mg  BID   No orders of the defined types were placed in this encounter.   Return in about 3 months (around 08/02/2024) for meds, labs.  Wellington Half, FNP

## 2024-05-03 LAB — VITAMIN D 25 HYDROXY (VIT D DEFICIENCY, FRACTURES): VITD: 28.34 ng/mL — ABNORMAL LOW (ref 30.00–100.00)

## 2024-05-03 LAB — TSH: TSH: 1.11 u[IU]/mL (ref 0.35–5.50)

## 2024-05-03 LAB — VITAMIN B12: Vitamin B-12: 655 pg/mL (ref 211–911)

## 2024-05-04 ENCOUNTER — Ambulatory Visit: Payer: Self-pay | Admitting: Family Medicine

## 2024-05-04 DIAGNOSIS — E559 Vitamin D deficiency, unspecified: Secondary | ICD-10-CM

## 2024-05-04 MED ORDER — VITAMIN D (ERGOCALCIFEROL) 1.25 MG (50000 UNIT) PO CAPS: 50000.0000 [IU] | ORAL_CAPSULE | ORAL | 0 refills | Status: AC

## 2024-05-16 ENCOUNTER — Other Ambulatory Visit: Payer: Self-pay | Admitting: Internal Medicine

## 2024-06-09 ENCOUNTER — Other Ambulatory Visit: Payer: Self-pay | Admitting: Internal Medicine

## 2024-06-09 DIAGNOSIS — Z23 Encounter for immunization: Secondary | ICD-10-CM

## 2024-07-10 ENCOUNTER — Telehealth: Payer: Self-pay | Admitting: Licensed Clinical Social Worker

## 2024-07-10 DIAGNOSIS — Z23 Encounter for immunization: Secondary | ICD-10-CM

## 2024-07-10 NOTE — Telephone Encounter (Signed)
 H&V Care Navigation CSW Progress Note  Clinical Social Worker received a call from pt to ask for assistance. Pt had been given my name and number by a family member who is active with [facility]. Pt provided name and DOB and shared he was seen at [facility]. After clarification he has not been seen for several years by providers with [facility] (had seen Dr. Pietro). Pt questions revolved around Medicare- provided him information for [facility] counselors at [employer] since pt is not active with our providers recommended he could find additional support there. Pt appreciative of recommendations.  Patient is participating in a Managed Medicaid Plan:  No, Medicare A&B only  SDOH Screenings   Food Insecurity: No Food Insecurity (12/04/2023)  Housing: Low Risk  (12/04/2023)  Transportation Needs: No Transportation Needs (12/04/2023)  Depression (PHQ2-9): Medium Risk (05/02/2024)  Financial Resource Strain: Medium Risk (12/04/2023)  Physical Activity: Unknown (12/04/2023)  Social Connections: Moderately Isolated (12/04/2023)  Stress: Stress Concern Present (12/04/2023)  Tobacco Use: High Risk (05/02/2024)    Marit Lark, MSW, LCSW Clinical Social Worker II [facility] Care Navigation  [phone]- work cell phone (preferred)

## 2024-07-11 NOTE — Telephone Encounter (Unsigned)
 Copied from CRM #[id number]. Topic: Clinical - Medication Refill >> Jul 11, 2024  2:53 PM Dedra B wrote: Medication: traMADol  (ULTRAM ) 50 MG tablet  Has the patient contacted their pharmacy? Yes  (Agent: If yes, when and what did the pharmacy advise?) Pt recently had all of [facility]. All of them transferred except the tramadol . He was told by the pharmacist at [facility] that he will need a new prescription for the tramadol .  This is the patient's preferred pharmacy:  [id number] - [place], [place] - [address] AT [facility] OF [address] [place] [id number] Phone: [phone] Fax: [phone]  Is this the correct pharmacy for this prescription? Yes  Has the prescription been filled recently? No  Is the patient out of the medication? Yes  Has the patient been seen for an appointment in the last year OR does the patient have an upcoming appointment? Yes  Can we respond through MyChart? Yes  Agent: Please be advised that Rx refills may take up to 3 business days. We ask that you follow-up with your pharmacy.

## 2024-07-12 ENCOUNTER — Other Ambulatory Visit (HOSPITAL_COMMUNITY): Payer: Self-pay

## 2024-07-12 MED ORDER — TRAMADOL HCL 50 MG PO TABS
50.0000 mg | ORAL_TABLET | Freq: Two times a day (BID) | ORAL | 0 refills | Status: DC
  Filled 2024-07-12: qty 60, 30d supply, fill #0

## 2024-07-12 NOTE — Telephone Encounter (Signed)
 Ok this is done

## 2024-07-12 NOTE — Addendum Note (Signed)
 Addended by: NORLEEN LYNWOOD ORN on: 07/12/2024 05:04 PM   Modules accepted: Orders

## 2024-07-15 ENCOUNTER — Other Ambulatory Visit: Payer: Self-pay | Admitting: Internal Medicine

## 2024-07-25 ENCOUNTER — Encounter: Payer: Self-pay | Admitting: Internal Medicine

## 2024-07-25 DIAGNOSIS — Z23 Encounter for immunization: Secondary | ICD-10-CM

## 2024-07-29 MED ORDER — TRAMADOL HCL 50 MG PO TABS
50.0000 mg | ORAL_TABLET | Freq: Two times a day (BID) | ORAL | 0 refills | Status: DC
Start: 2024-07-29 — End: 2024-08-29

## 2024-07-29 MED ORDER — LORAZEPAM 1 MG PO TABS: 1.0000 mg | ORAL_TABLET | Freq: Every day | ORAL | 2 refills | Status: AC | PRN

## 2024-07-29 MED ORDER — ESZOPICLONE 2 MG PO TABS: ORAL_TABLET | ORAL | 1 refills | Status: AC

## 2024-08-27 ENCOUNTER — Other Ambulatory Visit: Payer: Self-pay | Admitting: Internal Medicine

## 2024-08-27 ENCOUNTER — Encounter: Payer: Self-pay | Admitting: Internal Medicine

## 2024-08-27 DIAGNOSIS — Z23 Encounter for immunization: Secondary | ICD-10-CM

## 2024-08-29 ENCOUNTER — Other Ambulatory Visit: Payer: Self-pay | Admitting: Internal Medicine

## 2024-08-29 DIAGNOSIS — Z23 Encounter for immunization: Secondary | ICD-10-CM

## 2024-08-29 MED ORDER — TRAMADOL HCL 50 MG PO TABS: 50.0000 mg | ORAL_TABLET | Freq: Two times a day (BID) | ORAL | 0 refills | Status: DC

## 2024-08-29 NOTE — Telephone Encounter (Signed)
 Ok refill done for 1 month'  Pt should keep appt as scheduled for further refills    thanks

## 2024-08-29 NOTE — Telephone Encounter (Signed)
 Ok tramadol  refilled for 1 month'  Pt should keep appt for further refills.   thanks

## 2024-09-12 ENCOUNTER — Ambulatory Visit (INDEPENDENT_AMBULATORY_CARE_PROVIDER_SITE_OTHER): Admitting: Internal Medicine

## 2024-09-12 ENCOUNTER — Ambulatory Visit

## 2024-09-12 ENCOUNTER — Encounter: Payer: Self-pay | Admitting: Internal Medicine

## 2024-09-12 ENCOUNTER — Other Ambulatory Visit: Payer: Self-pay | Admitting: Internal Medicine

## 2024-09-12 ENCOUNTER — Ambulatory Visit: Payer: Self-pay | Admitting: Internal Medicine

## 2024-09-12 VITALS — BP 118/82 | HR 67 | Temp 97.9°F | Ht 70.0 in | Wt 176.0 lb

## 2024-09-12 DIAGNOSIS — R61 Generalized hyperhidrosis: Secondary | ICD-10-CM

## 2024-09-12 DIAGNOSIS — E7800 Pure hypercholesterolemia, unspecified: Secondary | ICD-10-CM

## 2024-09-12 DIAGNOSIS — Z125 Encounter for screening for malignant neoplasm of prostate: Secondary | ICD-10-CM | POA: Diagnosis not present

## 2024-09-12 DIAGNOSIS — Z720 Tobacco use: Secondary | ICD-10-CM

## 2024-09-12 DIAGNOSIS — R634 Abnormal weight loss: Secondary | ICD-10-CM

## 2024-09-12 DIAGNOSIS — E538 Deficiency of other specified B group vitamins: Secondary | ICD-10-CM

## 2024-09-12 DIAGNOSIS — E559 Vitamin D deficiency, unspecified: Secondary | ICD-10-CM

## 2024-09-12 DIAGNOSIS — N320 Bladder-neck obstruction: Secondary | ICD-10-CM

## 2024-09-12 DIAGNOSIS — G8929 Other chronic pain: Secondary | ICD-10-CM

## 2024-09-12 DIAGNOSIS — Z1211 Encounter for screening for malignant neoplasm of colon: Secondary | ICD-10-CM

## 2024-09-12 DIAGNOSIS — M545 Low back pain, unspecified: Secondary | ICD-10-CM | POA: Diagnosis not present

## 2024-09-12 DIAGNOSIS — E119 Type 2 diabetes mellitus without complications: Secondary | ICD-10-CM | POA: Diagnosis not present

## 2024-09-12 DIAGNOSIS — I10 Essential (primary) hypertension: Secondary | ICD-10-CM

## 2024-09-12 DIAGNOSIS — Z23 Encounter for immunization: Secondary | ICD-10-CM

## 2024-09-12 LAB — HEPATIC FUNCTION PANEL
ALT: 12 U/L (ref 0–53)
AST: 16 U/L (ref 0–37)
Albumin: 3.4 g/dL — ABNORMAL LOW (ref 3.5–5.2)
Alkaline Phosphatase: 74 U/L (ref 39–117)
Bilirubin, Direct: 0.1 mg/dL (ref 0.0–0.3)
Total Bilirubin: 0.8 mg/dL (ref 0.2–1.2)
Total Protein: 6 g/dL (ref 6.0–8.3)

## 2024-09-12 LAB — BASIC METABOLIC PANEL WITH GFR
BUN: 7 mg/dL (ref 6–23)
CO2: 30 meq/L (ref 19–32)
Calcium: 8.4 mg/dL (ref 8.4–10.5)
Chloride: 101 meq/L (ref 96–112)
Creatinine, Ser: 0.81 mg/dL (ref 0.40–1.50)
GFR: 92.52 mL/min (ref >60.00)
Glucose, Bld: 113 mg/dL — ABNORMAL HIGH (ref 70–99)
Potassium: 4.1 meq/L (ref 3.5–5.1)
Sodium: 138 meq/L (ref 135–145)

## 2024-09-12 LAB — CBC WITH DIFFERENTIAL/PLATELET
Basophils Absolute: 0 K/uL (ref 0.0–0.1)
Basophils Relative: 0.3 % (ref 0.0–3.0)
Eosinophils Absolute: 0.1 K/uL (ref 0.0–0.7)
Eosinophils Relative: 1.4 % (ref 0.0–5.0)
HCT: 45.3 % (ref 39.0–52.0)
Hemoglobin: 14.9 g/dL (ref 13.0–17.0)
Lymphocytes Relative: 21.1 % (ref 12.0–46.0)
Lymphs Abs: 1.4 K/uL (ref 0.7–4.0)
MCHC: 33 g/dL (ref 30.0–36.0)
MCV: 89.1 fl (ref 78.0–100.0)
Monocytes Absolute: 0.6 K/uL (ref 0.1–1.0)
Monocytes Relative: 9.2 % (ref 3.0–12.0)
Neutro Abs: 4.6 K/uL (ref 1.4–7.7)
Neutrophils Relative %: 68 % (ref 43.0–77.0)
Platelets: 176 K/uL (ref 150.0–400.0)
RBC: 5.09 Mil/uL (ref 4.22–5.81)
RDW: 13.2 % (ref 11.5–15.5)
WBC: 6.7 K/uL (ref 4.0–10.5)

## 2024-09-12 LAB — TSH: TSH: 1.35 u[IU]/mL (ref 0.35–5.50)

## 2024-09-12 LAB — URINALYSIS, ROUTINE W REFLEX MICROSCOPIC
Bilirubin Urine: NEGATIVE
Hgb urine dipstick: NEGATIVE
Ketones, ur: NEGATIVE
Leukocytes,Ua: NEGATIVE
Nitrite: NEGATIVE
RBC / HPF: NONE SEEN (ref 0–?)
Specific Gravity, Urine: 1.01 (ref 1.000–1.030)
Total Protein, Urine: NEGATIVE
Urine Glucose: NEGATIVE
Urobilinogen, UA: 1 (ref 0.0–1.0)
pH: 6.5 (ref 5.0–8.0)

## 2024-09-12 LAB — LIPID PANEL
Cholesterol: 345 mg/dL — ABNORMAL HIGH (ref 0–200)
HDL: 43.1 mg/dL (ref >39.00)
LDL Cholesterol: 255 mg/dL — ABNORMAL HIGH (ref 0–99)
NonHDL: 301.73
Total CHOL/HDL Ratio: 8
Triglycerides: 235 mg/dL — ABNORMAL HIGH (ref 0.0–149.0)
VLDL: 47 mg/dL — ABNORMAL HIGH (ref 0.0–40.0)

## 2024-09-12 LAB — PSA: PSA: 0.51 ng/mL (ref 0.10–4.00)

## 2024-09-12 LAB — MICROALBUMIN / CREATININE URINE RATIO
Creatinine,U: 73.7 mg/dL
Microalb Creat Ratio: UNDETERMINED mg/g (ref 0.0–30.0)
Microalb, Ur: <0.7 mg/dL

## 2024-09-12 LAB — VITAMIN D 25 HYDROXY (VIT D DEFICIENCY, FRACTURES): VITD: 27.33 ng/mL — ABNORMAL LOW (ref 30.00–100.00)

## 2024-09-12 LAB — VITAMIN B12: Vitamin B-12: 470 pg/mL (ref 211–911)

## 2024-09-12 MED ORDER — ROSUVASTATIN CALCIUM 40 MG PO TABS: 40.0000 mg | ORAL_TABLET | Freq: Every day | ORAL | 3 refills | Status: AC

## 2024-09-12 MED ORDER — PREDNISONE 10 MG PO TABS: ORAL_TABLET | ORAL | 0 refills | Status: AC

## 2024-09-12 MED ORDER — BUPROPION HCL ER (XL) 300 MG PO TB24: 300.0000 mg | ORAL_TABLET | Freq: Every day | ORAL | 3 refills | Status: AC

## 2024-09-12 MED ORDER — ESCITALOPRAM OXALATE 20 MG PO TABS: 20.0000 mg | ORAL_TABLET | Freq: Every day | ORAL | 3 refills | Status: AC

## 2024-09-12 NOTE — Assessment & Plan Note (Signed)
 Without long term insulin  Lab Results  Component Value Date   HGBA1C 6.7 (H) 05/02/2024   Stable, pt to continue current medical treatment  - diet, wt control

## 2024-09-12 NOTE — Patient Instructions (Addendum)
 Please continue all other medications as before, and refills have been done if requested.  Please have the pharmacy call with any other refills you may need.  Please continue your efforts at being more active, low cholesterol diet, and weight control.  Please keep your appointments with your specialists as you may have planned  Please go to the XRAY Department in the first floor for the x-ray testing  Please go to the LAB at the blood drawing area for the tests to be done  You will be contacted regarding the referral for: [email or web address]  You will be contacted by phone if any changes need to be made immediately.  Otherwise, you will receive a letter about your results with an explanation, but please check with MyChart first  Please make an Appointment to return in 6 months, or sooner if needed

## 2024-09-12 NOTE — Progress Notes (Signed)
 Patient ID: Tommy May, male   DOB: [birth year], 63 y.o.   MRN: [id number]        Chief Complaint: follow up sweats, wt loss, smoker, DM, htn, hld, low vit d, chronic lbp       HPI:  Tommy May is a 63 y.o. male here with c/o Peak wt has been 215 in past, now 51 here today, possibly related to lower appetite, low mood, anxiety  but also had sweats day and night drenching x 6 mo, more frequent now 2-3 x per wk, has to change the shirt often.  Still smoking, not ready to quit since 63yo, but has been trying to cut back to 20 cigs every few days  Declines flu shot today  Pt denies chest pain, increased sob or doe, wheezing, orthopnea, PND, increased LE swelling, palpitations, dizziness or syncope.   Pt denies polydipsia, polyuria, or new focal neuro s/s.   Pt agrees for cologuard followup.  Pt continues to have recurring LBP without change in severity, bowel or bladder change, fever, wt loss,  worsening LE pain/numbness/weakness, gait change or falls.  Declines optho referral due to cost.   Wt Readings from Last 3 Encounters:  09/12/24 176 lb (79.8 kg)  05/02/24 186 lb (84.4 kg)  12/04/23 208 lb 6.4 oz (94.5 kg)   BP Readings from Last 3 Encounters:  09/12/24 118/82  05/02/24 118/70  12/04/23 (!) 158/92         Past Medical History:  Diagnosis Date   Allergy    Carotid artery occlusion    Environmental and seasonal allergies    GERD (gastroesophageal reflux disease)    Heart palpitations    History of Holter monitoring    Hyperlipidemia    Hypertension    does not take meds   Nerve pain    neck; takes gabapentin    Sleep apnea    wears CPAP nightly   Past Surgical History:  Procedure Laterality Date   dental implants     ENDARTERECTOMY Left 01/12/2017   Procedure: Left Carotid ENDARTERECTOMY;  Surgeon: Redell LITTIE Door, MD;  Location: [facility] OR;  Service: Vascular;  Laterality: Left;   INGUINAL HERNIA REPAIR Left 08/25/2014   Procedure: LEFT INGUINAL HERNIA REPAIR REMOVAL SPERMATIC  CORD MASS;  Surgeon: Krystal Russell, MD;  Location: [facility] OR;  Service: General;  Laterality: Left;   INSERTION OF MESH N/A 08/25/2014   Procedure: INSERTION OF MESH;  Surgeon: Krystal Russell, MD;  Location: [facility] OR;  Service: General;  Laterality: N/A;   PATCH ANGIOPLASTY Left 01/12/2017   Procedure: PATCH ANGIOPLASTY;  Surgeon: Redell LITTIE Door, MD;  Location: [facility] OR;  Service: Vascular;  Laterality: Left;   ROTATOR CUFF REPAIR Left     reports that he has been smoking cigarettes. He has a 4.3 pack-year smoking history. He has never used smokeless tobacco. He reports that he does not drink alcohol and does not use drugs. family history includes Diabetes in his father and paternal grandmother; Heart attack in his maternal grandfather; Stroke (age of onset: 75) in his paternal grandfather; Stroke (age of onset: 23) in his father. No Known Allergies Current Outpatient Medications on File Prior to Visit  Medication Sig Dispense Refill   acetaminophen  (TYLENOL ) 500 MG tablet Take 1,000 mg by mouth every 8 (eight) hours as needed for moderate pain.      esomeprazole  (NEXIUM ) 40 MG capsule Take 1 capsule (40 mg total) by mouth daily with breakfast. 90 capsule 3   eszopiclone  (  LUNESTA ) 2 MG TABS tablet TAKE 1 TABLET BY MOUTH IMMEDIATELY BEFORE BEDTIME ONCE DAILY AS NEEDED FOR SLEEP 90 tablet 1   gabapentin  (NEURONTIN ) 300 MG capsule TAKE 1 CAPSULE BY MOUTH EVERYDAY AT BEDTIME 90 capsule 1   lidocaine  4 % Place 1 patch onto the skin daily. 10 patch 0   loratadine  (CLARITIN ) 10 MG tablet Take 10 mg by mouth daily.     LORazepam  (ATIVAN ) 1 MG tablet Take 1 tablet (1 mg total) by mouth daily as needed for anxiety. 30 tablet 2   losartan  (COZAAR ) 100 MG tablet TAKE 1 TABLET BY MOUTH EVERY DAY 30 tablet 11   Multiple Vitamin (MULTIVITAMIN WITH MINERALS) TABS tablet Take 1 tablet by mouth daily.     rosuvastatin  (CRESTOR ) 40 MG tablet Take 1 tablet by mouth daily.     traMADol  (ULTRAM ) 50 MG tablet Take 1 tablet (50  mg total) by mouth 2 (two) times daily. 60 tablet 0   Vitamin D , Ergocalciferol , (DRISDOL ) 1.25 MG (50000 UNIT) CAPS capsule Take 1 capsule (50,000 Units total) by mouth every 7 (seven) days. 8 capsule 0   No current facility-administered medications on file prior to visit.        ROS:  All others reviewed and negative.  Objective        PE:  BP 118/82 (BP Location: Right Arm, Patient Position: Sitting, Cuff Size: Normal)   Pulse 67   Temp 97.9 F (36.6 C) (Oral)   Ht 5' 10 (1.778 m)   Wt 176 lb (79.8 kg)   SpO2 98%   BMI 25.25 kg/m                 Constitutional: Pt appears in NAD               HENT: Head: NCAT.                Right Ear: External ear normal.                 Left Ear: External ear normal.                Eyes: . Pupils are equal, round, and reactive to light. Conjunctivae and EOM are normal               Nose: without d/c or deformity               Neck: Neck supple. Gross normal ROM               Cardiovascular: Normal rate and regular rhythm.                 Pulmonary/Chest: Effort normal and breath sounds without rales or wheezing.                Abd:  Soft, NT, ND, + BS, no organomegaly               Neurological: Pt is alert. At baseline orientation, motor grossly intact               Skin: Skin is warm. No rashes, no other new lesions, LE edema - none               Psychiatric: Pt behavior is normal without agitation   Micro: none  Cardiac tracings I have personally interpreted today:  none  Pertinent Radiological findings (summarize): none   Lab Results  Component Value Date   WBC 4.5 05/02/2024  HGB 15.0 05/02/2024   HCT 44.4 05/02/2024   PLT 246.0 05/02/2024   GLUCOSE 146 (H) 05/02/2024   CHOL 264 (H) 06/01/2023   TRIG 217.0 (H) 06/01/2023   HDL 41.30 06/01/2023   LDLDIRECT 178.0 06/01/2023   LDLCALC 108 (H) 12/01/2022   ALT 15 05/02/2024   AST 16 05/02/2024   NA 138 05/02/2024   K 3.9 05/02/2024   CL 104 05/02/2024   CREATININE 0.77  05/02/2024   BUN 7 05/02/2024   CO2 26 05/02/2024   TSH 1.11 05/02/2024   PSA 0.51 12/01/2022   INR 0.9 09/28/2023   HGBA1C 6.7 (H) 05/02/2024   MICROALBUR <0.7 05/02/2024   Assessment/Plan:  LONNEL GJERDE is a 63 y.o. White or Caucasian [1] male with  has a past medical history of Allergy, Carotid artery occlusion, Environmental and seasonal allergies, GERD (gastroesophageal reflux disease), Heart palpitations, History of Holter monitoring, Hyperlipidemia, Hypertension, Nerve pain, and Sleep apnea.  Type 2 diabetes mellitus without complication, without long-term current use of insulin (HCC) Without long term insulin  Lab Results  Component Value Date   HGBA1C 6.7 (H) 05/02/2024   Stable, pt to continue current medical treatment  - diet, wt control   Tobacco abuse Pt counsled to quit, pt not ready, declines referral for LDCT screening  Hypertension BP Readings from Last 3 Encounters:  09/12/24 118/82  05/02/24 118/70  12/04/23 (!) 158/92   Stable, pt to continue medical treatment losartan  100 mg qd   Hyperlipidemia Lab Results  Component Value Date   LDLCALC 108 (H) 12/01/2022   Lab Results  Component Value Date   CHOL 264 (H) 06/01/2023   CHOL 192 12/01/2022   CHOL 292 (H) 11/02/2021   Lab Results  Component Value Date   HDL 41.30 06/01/2023   HDL 54.00 12/01/2022   HDL 43.80 11/02/2021   Lab Results  Component Value Date   LDLCALC 108 (H) 12/01/2022   LDLCALC 125 09/08/2016   Lab Results  Component Value Date   TRIG 217.0 (H) 06/01/2023   TRIG 152.0 (H) 12/01/2022   TRIG 281.0 (H) 11/02/2021   Lab Results  Component Value Date   CHOLHDL 6 06/01/2023   CHOLHDL 4 12/01/2022   CHOLHDL 7 11/02/2021   Lab Results  Component Value Date   LDLDIRECT 178.0 06/01/2023   LDLDIRECT 230.0 11/02/2021   LDLDIRECT 78.0 11/18/2019    uncontrolled, pt to continue current statin crestor  40 mg and lower chol diet, for f/u lab today,    Weight  loss Etiology unclear, but sweats are concerning, pt today for CXR, consider CT abd pelvis if contd wt loss  Vitamin D  deficiency Last vitamin D  Lab Results  Component Value Date   VD25OH 28.34 (L) 05/02/2024   Low, to start oral replacement   Night sweats As per weight loss - for cxr, consider ct abd pelvis if negative  Chronic low back pain Pt states had extensive eval per ortho, and prednisone  has helped in past, now asking for repeat prednisone  taper  Followup: Return in about 6 months (around 03/13/2025).  Lynwood Rush, MD 09/12/2024 1:04 PM [facility]

## 2024-09-12 NOTE — Assessment & Plan Note (Signed)
 Pt states had extensive eval per ortho, and prednisone  has helped in past, now asking for repeat prednisone  taper

## 2024-09-12 NOTE — Assessment & Plan Note (Signed)
 BP Readings from Last 3 Encounters:  09/12/24 118/82  05/02/24 118/70  12/04/23 (!) 158/92   Stable, pt to continue medical treatment losartan  100 mg qd

## 2024-09-12 NOTE — Assessment & Plan Note (Signed)
 Lab Results  Component Value Date   LDLCALC 108 (H) 12/01/2022   Lab Results  Component Value Date   CHOL 264 (H) 06/01/2023   CHOL 192 12/01/2022   CHOL 292 (H) 11/02/2021   Lab Results  Component Value Date   HDL 41.30 06/01/2023   HDL 54.00 12/01/2022   HDL 43.80 11/02/2021   Lab Results  Component Value Date   LDLCALC 108 (H) 12/01/2022   LDLCALC 125 09/08/2016   Lab Results  Component Value Date   TRIG 217.0 (H) 06/01/2023   TRIG 152.0 (H) 12/01/2022   TRIG 281.0 (H) 11/02/2021   Lab Results  Component Value Date   CHOLHDL 6 06/01/2023   CHOLHDL 4 12/01/2022   CHOLHDL 7 11/02/2021   Lab Results  Component Value Date   LDLDIRECT 178.0 06/01/2023   LDLDIRECT 230.0 11/02/2021   LDLDIRECT 78.0 11/18/2019    uncontrolled, pt to continue current statin crestor  40 mg and lower chol diet, for f/u lab today,

## 2024-09-12 NOTE — Assessment & Plan Note (Signed)
 Pt counsled to quit, pt not ready, declines referral for LDCT screening

## 2024-09-12 NOTE — Assessment & Plan Note (Signed)
 Last vitamin D  Lab Results  Component Value Date   VD25OH 28.34 (L) 05/02/2024   Low, to start oral replacement

## 2024-09-12 NOTE — Assessment & Plan Note (Signed)
 As per weight loss - for cxr, consider ct abd pelvis if negative

## 2024-09-12 NOTE — Assessment & Plan Note (Signed)
 Etiology unclear, but sweats are concerning, pt today for CXR, consider CT abd pelvis if contd wt loss

## 2024-09-14 ENCOUNTER — Ambulatory Visit: Payer: Self-pay | Admitting: Internal Medicine

## 2024-09-30 ENCOUNTER — Other Ambulatory Visit: Payer: Self-pay | Admitting: Internal Medicine

## 2024-09-30 DIAGNOSIS — Z23 Encounter for immunization: Secondary | ICD-10-CM

## 2024-10-05 LAB — COLOGUARD: COLOGUARD: POSITIVE — AB

## 2024-10-06 ENCOUNTER — Other Ambulatory Visit: Payer: Self-pay | Admitting: Internal Medicine

## 2024-10-06 DIAGNOSIS — R195 Other fecal abnormalities: Secondary | ICD-10-CM

## 2024-11-25 ENCOUNTER — Encounter: Payer: Self-pay | Admitting: Internal Medicine

## 2024-12-04 ENCOUNTER — Telehealth: Payer: Self-pay | Admitting: *Deleted

## 2024-12-04 ENCOUNTER — Encounter

## 2024-12-04 NOTE — Telephone Encounter (Signed)
 Pt was scheduled to have PV in person after not showing at time of appointment RN called to make sure not lost or if on his way. LM with call back #.

## 2024-12-04 NOTE — Telephone Encounter (Signed)
" °  Pt came in for PV. PCP wanted him to have colonoscopy due to + cologuard. Pt is very concerned with the need to do this procedure. He states that his father in his 59's had one done and had a perforation and had a colostomy bag the rest of his life. RN suggested he see MD so he can discuss POC with Dr Abran.   Pt agreed to cancel PV and Colonoscopy until he sees MD to address his concerns .  OV for 3/4 @ 10 am with Dr Abran. Instructed to come in 15 min early and that MD is on 3rd floor.  Gave hand written copy of date time and MD to pt.No other questions or concerns at this time. "

## 2024-12-18 ENCOUNTER — Encounter: Admitting: Internal Medicine

## 2025-01-22 ENCOUNTER — Ambulatory Visit: Admitting: Internal Medicine
